# Patient Record
Sex: Female | Born: 1935 | Race: White | Hispanic: No | Marital: Single | Smoking: Never smoker
Health system: Southern US, Community
[De-identification: ages and names within clinical notes are randomized; demographics above are authoritative.]

## PROBLEM LIST (undated history)

## (undated) DIAGNOSIS — M1711 Unilateral primary osteoarthritis, right knee: Secondary | ICD-10-CM

## (undated) DIAGNOSIS — F0390 Unspecified dementia without behavioral disturbance: Secondary | ICD-10-CM

## (undated) DIAGNOSIS — I2510 Atherosclerotic heart disease of native coronary artery without angina pectoris: Secondary | ICD-10-CM

## (undated) DIAGNOSIS — I10 Essential (primary) hypertension: Secondary | ICD-10-CM

## (undated) DIAGNOSIS — I219 Acute myocardial infarction, unspecified: Secondary | ICD-10-CM

## (undated) DIAGNOSIS — E785 Hyperlipidemia, unspecified: Secondary | ICD-10-CM

## (undated) DIAGNOSIS — M1990 Unspecified osteoarthritis, unspecified site: Secondary | ICD-10-CM

## (undated) HISTORY — DX: Essential (primary) hypertension: I10

## (undated) HISTORY — DX: Unilateral primary osteoarthritis, right knee: M17.11

## (undated) HISTORY — PX: PARTIAL HYSTERECTOMY: SHX80

## (undated) HISTORY — PX: CATARACT EXTRACTION W/ INTRAOCULAR LENS  IMPLANT, BILATERAL: SHX1307

## (undated) HISTORY — PX: CARDIAC CATHETERIZATION: SHX172

## (undated) HISTORY — DX: Acute myocardial infarction, unspecified: I21.9

## (undated) HISTORY — DX: Atherosclerotic heart disease of native coronary artery without angina pectoris: I25.10

## (undated) HISTORY — PX: RETINAL DETACHMENT SURGERY: SHX105

## (undated) HISTORY — DX: Hyperlipidemia, unspecified: E78.5

---

## 1999-06-27 ENCOUNTER — Ambulatory Visit (HOSPITAL_COMMUNITY): Admission: RE | Admit: 1999-06-27 | Discharge: 1999-06-27 | Payer: Self-pay | Admitting: Family Medicine

## 1999-06-27 ENCOUNTER — Encounter: Payer: Self-pay | Admitting: Family Medicine

## 1999-10-27 ENCOUNTER — Ambulatory Visit (HOSPITAL_COMMUNITY): Admission: RE | Admit: 1999-10-27 | Discharge: 1999-10-27 | Payer: Self-pay | Admitting: Family Medicine

## 1999-10-27 ENCOUNTER — Encounter: Payer: Self-pay | Admitting: Family Medicine

## 2010-09-07 ENCOUNTER — Inpatient Hospital Stay (HOSPITAL_COMMUNITY): Admission: EM | Admit: 2010-09-07 | Discharge: 2010-09-09 | Payer: Self-pay | Admitting: Emergency Medicine

## 2010-09-07 ENCOUNTER — Ambulatory Visit: Payer: Self-pay | Admitting: Internal Medicine

## 2010-09-08 ENCOUNTER — Encounter: Payer: Self-pay | Admitting: Cardiovascular Disease

## 2010-09-20 ENCOUNTER — Ambulatory Visit: Payer: Self-pay | Admitting: Cardiovascular Disease

## 2010-09-20 ENCOUNTER — Encounter: Payer: Self-pay | Admitting: Internal Medicine

## 2010-09-20 DIAGNOSIS — I2510 Atherosclerotic heart disease of native coronary artery without angina pectoris: Secondary | ICD-10-CM

## 2010-09-20 DIAGNOSIS — I10 Essential (primary) hypertension: Secondary | ICD-10-CM

## 2010-10-24 ENCOUNTER — Encounter: Payer: Self-pay | Admitting: Internal Medicine

## 2010-12-22 ENCOUNTER — Ambulatory Visit: Admission: RE | Admit: 2010-12-22 | Discharge: 2010-12-22 | Payer: Self-pay | Source: Home / Self Care

## 2010-12-22 ENCOUNTER — Other Ambulatory Visit: Payer: Self-pay

## 2010-12-22 LAB — HEPATIC FUNCTION PANEL
ALT: 10 U/L (ref 0–35)
AST: 17 U/L (ref 0–37)
Albumin: 3.7 g/dL (ref 3.5–5.2)
Alkaline Phosphatase: 85 U/L (ref 39–117)
Bilirubin, Direct: 0.1 mg/dL (ref 0.0–0.3)
Total Bilirubin: 0.6 mg/dL (ref 0.3–1.2)
Total Protein: 6.4 g/dL (ref 6.0–8.3)

## 2010-12-22 LAB — LIPID PANEL
Cholesterol: 111 mg/dL (ref 0–200)
HDL: 59.1 mg/dL (ref >39.00)
LDL Cholesterol: 43 mg/dL (ref 0–99)
Total CHOL/HDL Ratio: 2
Triglycerides: 44 mg/dL (ref 0.0–149.0)
VLDL: 8.8 mg/dL (ref 0.0–40.0)

## 2011-01-10 ENCOUNTER — Telehealth: Payer: Self-pay | Admitting: Cardiovascular Disease

## 2011-01-12 NOTE — Assessment & Plan Note (Signed)
Summary: Rebecca Ford   Visit Type:  EPH Primary Provider:  Elias Else  CC:  no cardiac complaints today.  History of Present Illness: 77 yo WF with history of HTN, hyperlipidemia and recent diagnosis of CAD after presentation with a NSTEMI who underwent cardiac cath on 09/07/10 and was found to have three vessel CAD. Drug eluting stents were placed in the RCA, LAD and Diagonal branch. She did well and was discharged home on September 10, 2010. She is here today for hospital follow up. She has had no chest pain, SOB, palpitations, near syncope, syncope, LE edema, PND or orthopnea.   Current Medications (verified): 1)  Aspirin Ec 325 Mg Tbec (Aspirin) .... Take One Tablet By Mouth Daily 2)  Plavix 75 Mg Tabs (Clopidogrel Bisulfate) .Marland Kitchen.. 1 Tab Once Daily 3)  Norvasc 5 Mg Tabs (Amlodipine Besylate) .Marland Kitchen.. 1 Tab Once Daily 4)  Nitrostat 0.4 Mg Subl (Nitroglycerin) .Marland Kitchen.. 1 Tablet Under Tongue At Onset of Chest Pain; You May Repeat Every 5 Minutes For Up To 3 Doses. 5)  Crestor 40 Mg Tabs (Rosuvastatin Calcium) .Marland Kitchen.. 1 Tab At Bedtime 6)  Amitriptyline Hcl 50 Mg Tabs (Amitriptyline Hcl) .Marland Kitchen.. 1-2 Tab At Bedtime As Needed 7)  Benicar Hct 40-25 Mg Tabs (Olmesartan Medoxomil-Hctz) .Marland Kitchen.. 1 Tab Qam 8)  Fish Oil 1000 Mg Caps (Omega-3 Fatty Acids) .Marland Kitchen.. 1 Cap Two Times A Day 9)  Lisinopril 40 Mg Tabs (Lisinopril) .Marland Kitchen.. 1 Tab Qam 10)  Metoprolol Succinate 100 Mg Xr24h-Tab (Metoprolol Succinate) .Marland Kitchen.. 1 Tab Qam 11)  Icaps  Caps (Multiple Vitamins-Minerals) .Marland Kitchen.. 1 Cap Once Daily  Allergies: 1)  ! Codeine  Past History:  Past Medical History: Non-ST elevation myocardial infarction 9/11 CAD s/p cath 09/07/10 with 3 vessel CAD, DES in RCA, Diagonal and LAD Hypertension Hyperlipidemia  Past Surgical History: Partial Hysterectomy  Family History: Reviewed history from 09/16/2010 and no changes required. Mother had myocardial infarction at age 71, one sibling with diagnosis of coronary artery disease in his 45s.     The patient has four other siblings with diagnosis of coronary artery disease but not premature diagnosis.   Social History: Reviewed history from 09/16/2010 and no changes required. The patient lives in [place] alone.   She is retired from the [employer] after 40 years. She has no tobacco, EtOH, illicit drug use.  No herbal meds.  Follows a low calorie diet.  No regular exercise but is very active.   Review of Systems       The patient complains of fatigue.  The patient denies malaise, fever, weight gain/loss, vision loss, decreased hearing, hoarseness, chest pain, palpitations, shortness of breath, prolonged cough, wheezing, sleep apnea, coughing up blood, abdominal pain, blood in stool, nausea, vomiting, diarrhea, heartburn, incontinence, blood in urine, muscle weakness, joint pain, leg swelling, rash, skin lesions, headache, fainting, dizziness, depression, anxiety, enlarged lymph nodes, easy bruising or bleeding, and environmental allergies.    Vital Signs:  Patient profile:   77 year old female Height:      65 inches Weight:      149 pounds BMI:     24.88 Pulse rate:   76 / minute Pulse rhythm:   irregular BP sitting:   108 / 70  (left arm) Cuff size:   large  Vitals Entered By: Danielle Rankin, CMA (September 20, 2010 1:52 PM)  Physical Exam  General:  General: Well developed, well nourished, NAD HEENT: OP clear, mucus membranes moist SKIN: warm, dry Neuro: No focal deficits Musculoskeletal:  Muscle strength 5/5 all ext Psychiatric: Mood and affect normal Neck: No JVD, no carotid bruits, no thyromegaly, no lymphadenopathy. Lungs:Clear bilaterally, no wheezes, rhonci, crackles CV: RRR no murmurs, gallops rubs Abdomen: soft, NT, ND, BS present Extremities: No edema, pulses 2+.    Cardiac Cath  Procedure date:  09/07/2010  Findings:       1. The left main coronary artery had no disease.   2. The left anterior descending artery coursed to the apex and        appeared to have a 20% ostial stenosis.  There was a discrete       tubular 80% stenosis in the mid vessel.  There was mild plaque       disease in the distal vessel.  The first diagonal branch was       moderate sized and had a 99% hazy stenosis.   3. The circumflex artery had an ostial 30% stenosis and gave off a       small-caliber first obtuse marginal branch that had plaque disease.       The second obtuse marginal branch was moderate sized with a 40%       stenosis.   4. The right coronary artery was a large dominant vessel with a mid       99% stenosis.  There was plaque in the distal vessel.   5. Left ventricular angiogram was performed in the RAO projection       which showed normal left ventricular systolic function with an       ejection fraction of 50%.      IMPRESSION:   1. Triple-vessel coronary artery disease.   2. Successful three-vessel percutaneous coronary intervention.   3. Non-ST-elevation myocardial infarction.   4. Normal left ventricular systolic function.      Echocardiogram  Procedure date:  09/08/2010  Findings:       Left ventricle: The cavity size was normal. Wall thickness was     increased in a pattern of mild LVH. Systolic function was normal.     The estimated ejection fraction was in the range of 55% to 60%.     Regional wall motion abnormalities cannot be excluded. Doppler     parameters are consistent with abnormal left ventricular     relaxation (grade 1 diastolic dysfunction).   - Mitral valve: Calcified annulus.  EKG  Procedure date:  09/20/2010  Findings:      NSR, rate 76 bpm. LAD. Incomplete RBBB. Nonspecific T wave changes.   Impression & Recommendations:  Problem # 1:  CAD, NATIVE VESSEL (ICD-414.01)  Stable s/p DES to LAD, Diagonal, RCA. She is doing well. Will continue ASA and Plavix for at least one year. Will decrease ASA to 81 mg per day in three months. Continue beta blocker and statin. Will check fasting lipids and LFTS  in 12 weeks.   Her updated medication list for this problem includes:    Aspirin Ec 325 Mg Tbec (Aspirin) .Marland Kitchen... Take one tablet by mouth daily    Plavix 75 Mg Tabs (Clopidogrel bisulfate) .Marland Kitchen... 1 tab once daily    Norvasc 5 Mg Tabs (Amlodipine besylate) .Marland Kitchen... 1 tab once daily    Nitrostat 0.4 Mg Subl (Nitroglycerin) .Marland Kitchen... 1 tablet under tongue at onset of chest pain; you may repeat every 5 minutes for up to 3 doses.    Lisinopril 40 Mg Tabs (Lisinopril) .Marland Kitchen... 1 tab qam    Metoprolol Succinate 100 Mg Xr24h-tab (Metoprolol succinate) .Marland KitchenMarland KitchenMarland KitchenMarland Kitchen  1 tab qam  Orders: EKG w/ Interpretation (93000)  Problem # 2:  HYPERTENSION, BENIGN (ICD-401.1) Continue current meds. Will have her take Norvasc at night.  She will follow her BP readings at home on her home cuff and will call us if her SBP is running higher than 140.   Her updated medication list for this problem includes:    Aspirin Ec 325 Mg Tbec (Aspirin) .Marland Kitchen... Take one tablet by mouth daily    Norvasc 5 Mg Tabs (Amlodipine besylate) .Marland Kitchen... 1 tab once daily    Benicar Hct 40-25 Mg Tabs (Olmesartan medoxomil-hctz) .Marland Kitchen... 1 tab qam    Lisinopril 40 Mg Tabs (Lisinopril) .Marland Kitchen... 1 tab qam    Metoprolol Succinate 100 Mg Xr24h-tab (Metoprolol succinate) .Marland Kitchen... 1 tab qam  Patient Instructions: 1)  Your physician recommends that you schedule a follow-up appointment in: 6 months 2)  Your physician recommends that you return for a FASTING lipid profile and liver function test in 12 weeks. 3)  Your physician recommends that you continue on your current medications as directed. Please refer to the Current Medication list given to you today. DECREASE your ASPIRIN to 81mg  by mouth in 3 months.

## 2011-01-12 NOTE — Miscellaneous (Signed)
Summary: [facility] Physician Order/Treatment Plan   [facility] Physician Order/Treatment Plan   Imported By: Roderic Ovens 10/10/2010 10:47:56  _____________________________________________________________________  External Attachment:    Type:   Image     Comment:   External Document

## 2011-01-12 NOTE — Cardiovascular Report (Signed)
Summary: [facility]  [facility]   Imported By: Roderic Ovens 09/26/2010 17:05:42  _____________________________________________________________________  External Attachment:    Type:   Image     Comment:   External Document

## 2011-01-12 NOTE — Miscellaneous (Signed)
Summary: [facility] Progress Report   [facility]   Imported By: Roderic Ovens 11/29/2010 11:57:04  _____________________________________________________________________  External Attachment:    Type:   Image     Comment:   External Document

## 2011-01-12 NOTE — Miscellaneous (Signed)
Summary: Orders Update  Clinical Lists Changes  Orders: Added new Test order of TLB-Hepatic/Liver Function Pnl (80076-HEPATIC) - Signed Added new Test order of TLB-Lipid Panel (80061-LIPID) - Signed 

## 2011-01-18 NOTE — Progress Notes (Signed)
Summary: test results and question about Plavix  Phone Note Call from Patient Call back at Home Phone [phone]   Caller: Patient Complaint: Urinary/GYN Problems Summary of Call: test results and question about Plavix Initial call taken by: Judie Grieve,  January 10, 2011 2:10 PM  Follow-up for Phone Call        LVMTCB* Whitney Maeola Sarah RN  January 10, 2011 3:12 PM  Follow-up by: Whitney Maeola Sarah RN,  January 10, 2011 3:12 PM  Additional Follow-up for Phone Call Additional follow up Details #1::        pt rtn call from yesterday plz call her at [phone].Rebecca Ford is on plavix and crestor is very expensive and she can't afford it, is there something else she can take that dosen't cost so much   Omer Jack  January 12, 2011 1:34 PM     Additional Follow-up for Phone Call Additional follow up Details #2::    I spoke with the pt and made her aware of lab results.  I made the pt aware that there is not a cheaper alternative to plavix at this time.  The pt had recent stent placement in September 2011 and would have to remain on Plavix a minimum of 1 year and then Dr Clifton James would decide if pt can stop med.  I spoke with the pt about Lipitor as a possible alternative to Crestor. The pt will check with pharmacy to see how much this medication would cost per month.  If this medication is cheaper then the pt will call the office back to discuss changing statin.  Follow-up by: Julieta Gutting, RN, BSN,  January 12, 2011 2:06 PM

## 2011-02-23 LAB — COMPREHENSIVE METABOLIC PANEL
ALT: 14 U/L (ref 0–35)
AST: 30 U/L (ref 0–37)
CO2: 26 mEq/L (ref 19–32)
Chloride: 97 mEq/L (ref 96–112)
Creatinine, Ser: 0.64 mg/dL (ref 0.4–1.2)
GFR calc Af Amer: >60 mL/min (ref >60)
GFR calc non Af Amer: >60 mL/min (ref >60)
Sodium: 129 mEq/L — ABNORMAL LOW (ref 135–145)
Total Bilirubin: 0.5 mg/dL (ref 0.3–1.2)

## 2011-02-23 LAB — CBC
Hemoglobin: 13.2 g/dL (ref 12.0–15.0)
MCHC: 35.4 g/dL (ref 30.0–36.0)
Platelets: 189 10*3/uL (ref 150–400)
RBC: 3.98 MIL/uL (ref 3.87–5.11)
RBC: 4.15 MIL/uL (ref 3.87–5.11)
WBC: 7 10*3/uL (ref 4.0–10.5)
WBC: 8.6 10*3/uL (ref 4.0–10.5)

## 2011-02-23 LAB — CARDIAC PANEL(CRET KIN+CKTOT+MB+TROPI)
CK, MB: 12.5 ng/mL (ref 0.3–4.0)
Total CK: 124 U/L (ref 7–177)

## 2011-02-23 LAB — DIFFERENTIAL
Basophils Relative: 0 % (ref 0–1)
Lymphocytes Relative: 12 % (ref 12–46)
Monocytes Relative: 4 % (ref 3–12)
Neutro Abs: 7.2 10*3/uL (ref 1.7–7.7)
Neutrophils Relative %: 84 % — ABNORMAL HIGH (ref 43–77)

## 2011-02-23 LAB — BASIC METABOLIC PANEL
CO2: 26 mEq/L (ref 19–32)
CO2: 28 mEq/L (ref 19–32)
Calcium: 8.6 mg/dL (ref 8.4–10.5)
Calcium: 8.9 mg/dL (ref 8.4–10.5)
Chloride: 102 mEq/L (ref 96–112)
Creatinine, Ser: 0.69 mg/dL (ref 0.4–1.2)
GFR calc Af Amer: >60 mL/min (ref >60)
GFR calc Af Amer: >60 mL/min (ref >60)
GFR calc Af Amer: >60 mL/min (ref >60)
GFR calc non Af Amer: >60 mL/min (ref >60)
Sodium: 131 mEq/L — ABNORMAL LOW (ref 135–145)
Sodium: 136 mEq/L (ref 135–145)

## 2011-02-23 LAB — CK TOTAL AND CKMB (NOT AT ARMC): Total CK: 120 U/L (ref 7–177)

## 2011-02-23 LAB — PLATELET INHIBITION P2Y12
P2Y12 % Inhibition: 50 %
Platelet Function Baseline: 351 [PRU] (ref 194–418)

## 2011-02-23 LAB — POCT CARDIAC MARKERS
CKMB, poc: 2.1 ng/mL (ref 1.0–8.0)
Myoglobin, poc: 88 ng/mL (ref 12–200)

## 2011-04-04 ENCOUNTER — Encounter: Payer: Self-pay | Admitting: Cardiovascular Disease

## 2011-04-05 ENCOUNTER — Ambulatory Visit (INDEPENDENT_AMBULATORY_CARE_PROVIDER_SITE_OTHER): Payer: Medicare Other | Admitting: Cardiovascular Disease

## 2011-04-05 ENCOUNTER — Encounter: Payer: Self-pay | Admitting: Cardiovascular Disease

## 2011-04-05 VITALS — BP 126/68 | HR 67 | Resp 18 | Ht 65.0 in | Wt 157.0 lb

## 2011-04-05 DIAGNOSIS — I2510 Atherosclerotic heart disease of native coronary artery without angina pectoris: Secondary | ICD-10-CM

## 2011-04-05 DIAGNOSIS — I10 Essential (primary) hypertension: Secondary | ICD-10-CM

## 2011-04-05 NOTE — Progress Notes (Signed)
History of Present Illness:77 yo WF with history of HTN, hyperlipidemia and CAD here today for cardiac follow up. She had a NSTEMI and underwent cardiac cath on 09/07/10 and was found to have three vessel CAD. Drug eluting stents were placed in the RCA, LAD and Diagonal branch.  She has had no chest pain, SOB, palpitations, near syncope, syncope, LE edema, PND or orthopnea.   Her primary care doctor is Elias Else, [facility].  Most recent lipid profile from January 2012 with total chol: 111, hdl: 59, LDL 43. TG 44.   Past Medical History  Diagnosis Date  . MI (myocardial infarction)     Non-ST elevation 9/11  . CAD (coronary artery disease)     s/p cath 09/07/10 w/3 vessel CAD, DES in RCA, Diagonal & LAD  . HTN (hypertension)   . Hyperlipemia     Past Surgical History  Procedure Date  . Partial hysterectomy     Current Outpatient Prescriptions  Medication Sig Dispense Refill  . amitriptyline (ELAVIL) 50 MG tablet Take by mouth. 1-2 tabs qhs prn       . amLODipine (NORVASC) 5 MG tablet Take 5 mg by mouth daily.        Marland Kitchen aspirin EC 325 MG EC tablet Take 325 mg by mouth daily.        . clopidogrel (PLAVIX) 75 MG tablet Take 75 mg by mouth daily.        . fish oil-omega-3 fatty acids 1000 MG capsule Take 1 g by mouth 2 (two) times daily.        Marland Kitchen lisinopril (PRINIVIL,ZESTRIL) 40 MG tablet Take 40 mg by mouth daily.        . metoprolol (TOPROL-XL) 100 MG 24 hr tablet Take 100 mg by mouth daily.        . Multiple Vitamins-Minerals (ICAPS MV PO) Take 1 capsule by mouth daily.        . nitroGLYCERIN (NITROSTAT) 0.4 MG SL tablet Place 0.4 mg under the tongue. 1 tab under tongue at onset of chest pain; you may repeat every for up to 3 doses       . olmesartan-hydrochlorothiazide (BENICAR HCT) 40-25 MG per tablet Take 1 tablet by mouth daily.        . rosuvastatin (CRESTOR) 40 MG tablet Take 40 mg by mouth at bedtime.          Allergies  Allergen Reactions  . Codeine      History   Social History  . Marital Status: Single    Spouse Name: N/A    Number of Children: N/A  . Years of Education: N/A   Occupational History  . Not on file.   Social History Main Topics  . Smoking status: Never Smoker   . Smokeless tobacco: Not on file  . Alcohol Use: No  . Drug Use: Not on file  . Sexually Active: Not on file   Other Topics Concern  . Not on file   Social History Narrative  . No narrative on file    Family History  Problem Relation Age of Onset  . Heart attack Mother   . Coronary artery disease Brother   . Coronary artery disease Other     diagnosis of CAD but not premature diagnosis    Review of Systems:  As stated in the HPI and otherwise negative.   BP 126/68  Pulse 67  Resp 18  Ht 5\' 5"  (1.651 m)  Wt 157 lb (  71.215 kg)  BMI 26.13 kg/m2  Physical Examination: General: Well developed, well nourished, NAD HEENT: OP clear, mucus membranes moist SKIN: warm, dry. No rashes. Neuro: No focal deficits Musculoskeletal: Muscle strength 5/5 all ext Psychiatric: Mood and affect normal Neck: No JVD, no carotid bruits, no thyromegaly, no lymphadenopathy. Lungs:Clear bilaterally, no wheezes, rhonci, crackles Cardiovascular: Regular rate and rhythm. No murmurs, gallops or rubs. Abdomen:Soft. Bowel sounds present. Non-tender.  Extremities: No lower extremity edema. Pulses are 2 + in the bilateral DP/PT.  EKG:NSR, rate 67 bpm. Incomplete RBBB, Non-specific T wave abnormality.

## 2011-04-05 NOTE — Assessment & Plan Note (Signed)
BP well controlled. No changes.  

## 2011-04-05 NOTE — Assessment & Plan Note (Signed)
Stable. Continue ASA and Plavix for at least one year. Continue beta blocker, Ace-inhibitor, statin.

## 2011-04-20 ENCOUNTER — Other Ambulatory Visit: Payer: Self-pay | Admitting: Cardiovascular Disease

## 2011-05-01 ENCOUNTER — Other Ambulatory Visit: Payer: Self-pay | Admitting: Cardiovascular Disease

## 2011-05-04 NOTE — Telephone Encounter (Signed)
Pt waiting on refill for amlodipine 5 mg [facility] [address] sine 5-21

## 2011-05-09 ENCOUNTER — Telehealth: Payer: Self-pay | Admitting: Cardiovascular Disease

## 2011-05-09 NOTE — Telephone Encounter (Signed)
Pt needs amlodipine to be call in to [facility] [address]. Pt has called several times for her meds and no one has refill her meds or call her re her meds.    Pt would like a call once meds are call in to the pharmacy.

## 2011-05-10 ENCOUNTER — Other Ambulatory Visit: Payer: Self-pay | Admitting: *Deleted

## 2011-05-10 NOTE — Telephone Encounter (Signed)
This medication order was already filled and picked up.  No further action needed.  Judithe Modest, CMA

## 2011-06-12 ENCOUNTER — Telehealth: Payer: Self-pay | Admitting: Cardiovascular Disease

## 2011-06-12 DIAGNOSIS — E785 Hyperlipidemia, unspecified: Secondary | ICD-10-CM

## 2011-06-12 DIAGNOSIS — I25810 Atherosclerosis of coronary artery bypass graft(s) without angina pectoris: Secondary | ICD-10-CM

## 2011-06-12 MED ORDER — CLOPIDOGREL BISULFATE 75 MG PO TABS: 75.0000 mg | ORAL_TABLET | Freq: Every day | ORAL | Status: DC

## 2011-06-12 NOTE — Telephone Encounter (Signed)
SPOKE WITH PT SENT NEW SCRIPT VIA [facility] FOR GEN PLAVIX  IS WILLING TO TRY AN INEXPENSIVE CHOLESTEROL MED IS CURRENTLY TAKING CRESTOR  IS IN DOUGHNUT WHOLE AND THESE TWO MEDS ARE THE MOST EXPENSIVE PLEASE ADVISE./CY

## 2011-06-12 NOTE — Telephone Encounter (Signed)
Pt has question re meds. Pt would like to talk to a nurse. °

## 2011-06-12 NOTE — Telephone Encounter (Signed)
Pt calling back thought she was waiting on call from nurse, i told her the nurse will call back, she said she's in "doughnut hole" with the insurance and wants to know if she can go on generic

## 2011-06-15 NOTE — Telephone Encounter (Signed)
LMTCB

## 2011-06-15 NOTE — Telephone Encounter (Signed)
We can switch her to atorvastatin 80 mg po QHS. Thanks, chris

## 2011-06-29 MED ORDER — ATORVASTATIN CALCIUM 80 MG PO TABS: 80.0000 mg | ORAL_TABLET | Freq: Every day | ORAL | Status: DC

## 2011-06-29 NOTE — Telephone Encounter (Signed)
Addended by: Ellender Hose on: 06/29/2011 04:11 PM   Modules accepted: Orders

## 2011-06-29 NOTE — Telephone Encounter (Signed)
Will send in a new prescription for Atorvastatin 80 mg daily.

## 2011-10-17 ENCOUNTER — Telehealth: Payer: Self-pay | Admitting: Cardiovascular Disease

## 2011-10-17 NOTE — Telephone Encounter (Signed)
I talked with pt. Pt asking if she can stop atorvastatin. I told pt she should keep taking atorvastatin to continue to get the cholesterol lowering benefit unless she was having a significant side effect from the medication. Pt states she will continue atorvastatin.

## 2011-10-17 NOTE — Telephone Encounter (Signed)
Pt has questions regarding atorzastatin 80mg 

## 2012-01-06 ENCOUNTER — Other Ambulatory Visit: Payer: Self-pay | Admitting: Cardiovascular Disease

## 2012-01-23 ENCOUNTER — Other Ambulatory Visit: Payer: Self-pay | Admitting: Cardiovascular Disease

## 2012-02-13 ENCOUNTER — Telehealth: Payer: Self-pay | Admitting: Cardiovascular Disease

## 2012-02-13 NOTE — Telephone Encounter (Signed)
Patient would like return call [phone]  Patient has questions about meds, please return call at hm#

## 2012-02-13 NOTE — Telephone Encounter (Signed)
Spoke with pt who is asking if any of her medicines could cause her to have weak spells.  She states she has been feeling weak and tired for awhile at times.  Overall feels good but has episodes where she feels weak.  Has not passed out or felt faint. No chest pain.  She has upcoming appt with primary care MD and will discuss with him. Appt made for pt to see Dr. Clifton James for 12 month follow up on April 04, 2012 at 10:00

## 2012-03-21 ENCOUNTER — Other Ambulatory Visit: Payer: Self-pay | Admitting: Cardiovascular Disease

## 2012-03-22 ENCOUNTER — Telehealth: Payer: Self-pay | Admitting: *Deleted

## 2012-03-22 NOTE — Telephone Encounter (Signed)
Pharmacy calling stating they have 2 orders for med for hyperlipidemia--crestor 40mg  from dr reid(pcp) and 1 from dr Clifton James for atorvastatin--pharmacy wants to know which one?--advised to go with crestor 40mg  1 poqd, as that is what is listed in med list--please call pt and change if i ordered the wrong med --thanks nt

## 2012-03-22 NOTE — Telephone Encounter (Signed)
Refilled atorvastatin

## 2012-04-04 ENCOUNTER — Ambulatory Visit (INDEPENDENT_AMBULATORY_CARE_PROVIDER_SITE_OTHER): Payer: Medicare Other | Admitting: Cardiovascular Disease

## 2012-04-04 ENCOUNTER — Encounter: Payer: Self-pay | Admitting: Cardiovascular Disease

## 2012-04-04 VITALS — BP 131/64 | HR 68 | Ht 65.0 in | Wt 154.0 lb

## 2012-04-04 DIAGNOSIS — I2510 Atherosclerotic heart disease of native coronary artery without angina pectoris: Secondary | ICD-10-CM

## 2012-04-04 MED ORDER — ASPIRIN EC 81 MG PO TBEC: 81.0000 mg | DELAYED_RELEASE_TABLET | Freq: Every day | ORAL | Status: AC

## 2012-04-04 NOTE — Assessment & Plan Note (Addendum)
Stable. Will lower ASA to 81 mg Qdaily. Will stop Plavix. Continue beta blocker and statin. BP well controlled. Lipids well controlled in primary care.

## 2012-04-04 NOTE — Patient Instructions (Signed)
Your physician wants you to follow-up in: 12 months.You will receive a reminder letter in the mail two months in advance. If you don't receive a letter, please call our office to schedule the follow-up appointment.  Your physician has recommended you make the following change in your medication: Stop Plavix.  Decrease aspirin to 81 mg by mouth daily

## 2012-04-04 NOTE — Progress Notes (Signed)
History of Present Illness: 78 yo WF with history of HTN, hyperlipidemia and CAD here today for cardiac follow up. She had a NSTEMI and underwent cardiac cath on 09/07/10 and was found to have three vessel CAD. Drug eluting stents were placed in the RCA, LAD and Diagonal branch.   She has had no chest pain, SOB, palpitations, near syncope, syncope, LE edema, PND or orthopnea. She has been planting a garden.   Primary Care Physician: Elias Else, [facility].   Last Lipid Profile:  More recent in primary care.  Lipid Panel     Component Value Date/Time   CHOL 111 12/22/2010 1142   TRIG 44.0 12/22/2010 1142   HDL 59.10 12/22/2010 1142   CHOLHDL 2 12/22/2010 1142   VLDL 8.8 12/22/2010 1142   LDLCALC 43 12/22/2010 1142     Past Medical History  Diagnosis Date  . MI (myocardial infarction)     Non-ST elevation 9/11  . CAD (coronary artery disease)     s/p cath 09/07/10 w/3 vessel CAD, DES in RCA, Diagonal & LAD  . HTN (hypertension)   . Hyperlipemia     Past Surgical History  Procedure Date  . Partial hysterectomy     Current Outpatient Prescriptions  Medication Sig Dispense Refill  . amitriptyline (ELAVIL) 50 MG tablet Take by mouth. 1-2 tabs qhs prn       . amLODipine (NORVASC) 5 MG tablet TAKE 1 TABLET BY MOUTH EVERY DAY  30 tablet  6  . aspirin EC 325 MG EC tablet Take 325 mg by mouth daily.        Marland Kitchen atorvastatin (LIPITOR) 80 MG tablet TAKE 1 TABLET BY MOUTH EVERY DAY  30 tablet  6  . clopidogrel (PLAVIX) 75 MG tablet TAKE 1 TABLET (75 MG TOTAL) BY MOUTH DAILY.  30 tablet  6  . fish oil-omega-3 fatty acids 1000 MG capsule Take 1 g by mouth 2 (two) times daily.        . metoprolol (TOPROL-XL) 100 MG 24 hr tablet Take 100 mg by mouth daily.        . Multiple Vitamins-Minerals (ICAPS MV PO) Take 1 capsule by mouth daily.        . nitroGLYCERIN (NITROSTAT) 0.4 MG SL tablet Place 0.4 mg under the tongue. 1 tab under tongue at onset of chest pain; you may repeat every  for up to 3 doses       . olmesartan-hydrochlorothiazide (BENICAR HCT) 40-25 MG per tablet Take 1 tablet by mouth daily.          Allergies  Allergen Reactions  . Codeine     History   Social History  . Marital Status: Single    Spouse Name: N/A    Number of Children: N/A  . Years of Education: N/A   Occupational History  . Not on file.   Social History Main Topics  . Smoking status: Never Smoker   . Smokeless tobacco: Not on file  . Alcohol Use: No  . Drug Use: Not on file  . Sexually Active: Not on file   Other Topics Concern  . Not on file   Social History Narrative  . No narrative on file    Family History  Problem Relation Age of Onset  . Heart attack Mother   . Coronary artery disease Brother   . Coronary artery disease Other     diagnosis of CAD but not premature diagnosis    Review of  Systems:  As stated in the HPI and otherwise negative.   BP 131/64  Pulse 68  Ht 5\' 5"  (1.651 m)  Wt 154 lb (69.854 kg)  BMI 25.63 kg/m2  Physical Examination: General: Well developed, well nourished, NAD HEENT: OP clear, mucus membranes moist SKIN: warm, dry. No rashes. Neuro: No focal deficits Musculoskeletal: Muscle strength 5/5 all ext Psychiatric: Mood and affect normal Neck: No JVD, no carotid bruits, no thyromegaly, no lymphadenopathy. Lungs:Clear bilaterally, no wheezes, rhonci, crackles Cardiovascular: Regular rate and rhythm. No murmurs, gallops or rubs. Abdomen:Soft. Bowel sounds present. Non-tender.  Extremities: No lower extremity edema. Pulses are 2 + in the bilateral DP/PT.  EKG: NSR, rate 69 bpm. Incomplete RBBB. T wave abnormalities anterolateral leads. Unchanged from 2012.

## 2012-10-30 ENCOUNTER — Other Ambulatory Visit: Payer: Self-pay | Admitting: Cardiovascular Disease

## 2013-03-13 ENCOUNTER — Encounter: Payer: Medicare Other | Admitting: Cardiovascular Disease

## 2013-03-13 NOTE — Progress Notes (Signed)
No show for appt. cdm  

## 2013-05-28 ENCOUNTER — Other Ambulatory Visit: Payer: Self-pay

## 2013-05-28 ENCOUNTER — Other Ambulatory Visit: Payer: Self-pay | Admitting: Cardiovascular Disease

## 2013-06-03 ENCOUNTER — Ambulatory Visit (INDEPENDENT_AMBULATORY_CARE_PROVIDER_SITE_OTHER): Payer: Medicare Other | Admitting: Cardiovascular Disease

## 2013-06-03 ENCOUNTER — Encounter: Payer: Self-pay | Admitting: Cardiovascular Disease

## 2013-06-03 VITALS — BP 125/66 | HR 63 | Resp 12 | Ht 65.0 in | Wt 149.0 lb

## 2013-06-03 DIAGNOSIS — I2510 Atherosclerotic heart disease of native coronary artery without angina pectoris: Secondary | ICD-10-CM

## 2013-06-03 NOTE — Progress Notes (Signed)
History of Present Illness: 79 yo WF with history of HTN, hyperlipidemia and CAD here today for cardiac follow up. She had a NSTEMI and underwent cardiac cath on 09/07/10 and was found to have three vessel CAD. Drug eluting stents were placed in the RCA, LAD and Diagonal branch.   She is here today for follow up. She has had no chest pain, SOB, palpitations, near syncope, syncope, LE edema, PND or orthopnea. She has been planting a garden.   Primary Care Physician: Elias Else, [facility].   Last Lipid Profile: Followed in primary care.   Past Medical History  Diagnosis Date  . MI (myocardial infarction)     Non-ST elevation 9/11  . CAD (coronary artery disease)     s/p cath 09/07/10 w/3 vessel CAD, DES in RCA, Diagonal & LAD  . HTN (hypertension)   . Hyperlipemia     Past Surgical History  Procedure Laterality Date  . Partial hysterectomy      Current Outpatient Prescriptions  Medication Sig Dispense Refill  . amitriptyline (ELAVIL) 50 MG tablet Take by mouth. 1-2 tabs qhs prn       . amLODipine (NORVASC) 5 MG tablet TAKE 1 TABLET BY MOUTH EVERY DAY  30 tablet  6  . aspirin 81 MG tablet Take 81 mg by mouth daily.      Marland Kitchen atorvastatin (LIPITOR) 80 MG tablet TAKE 1 TABLET BY MOUTH EVERY DAY  30 tablet  6  . fish oil-omega-3 fatty acids 1000 MG capsule Take 1 g by mouth 2 (two) times daily.        . metoprolol (TOPROL-XL) 100 MG 24 hr tablet Take 100 mg by mouth daily.        . Multiple Vitamins-Minerals (ICAPS MV PO) Take 1 capsule by mouth daily.        . nitroGLYCERIN (NITROSTAT) 0.4 MG SL tablet Place 0.4 mg under the tongue. 1 tab under tongue at onset of chest pain; you may repeat every for up to 3 doses       . olmesartan-hydrochlorothiazide (BENICAR HCT) 40-25 MG per tablet Take 1 tablet by mouth daily.         No current facility-administered medications for this visit.    Allergies  Allergen Reactions  . Codeine     History   Social History  .  Marital Status: Single    Spouse Name: N/A    Number of Children: N/A  . Years of Education: N/A   Occupational History  . Not on file.   Social History Main Topics  . Smoking status: Never Smoker   . Smokeless tobacco: Not on file  . Alcohol Use: No  . Drug Use: Not on file  . Sexually Active: Not on file   Other Topics Concern  . Not on file   Social History Narrative  . No narrative on file    Family History  Problem Relation Age of Onset  . Heart attack Mother   . Coronary artery disease Brother   . Coronary artery disease Other     diagnosis of CAD but not premature diagnosis    Review of Systems:  As stated in the HPI and otherwise negative.   BP 125/66  Pulse 63  Ht 5\' 5"  (1.651 m)  Wt 149 lb (67.586 kg)  BMI 24.79 kg/m2  Physical Examination: General: Well developed, well nourished, NAD HEENT: OP clear, mucus membranes moist SKIN: warm, dry. No rashes. Neuro: No focal deficits  Musculoskeletal: Muscle strength 5/5 all ext Psychiatric: Mood and affect normal Neck: No JVD, no carotid bruits, no thyromegaly, no lymphadenopathy. Lungs:Clear bilaterally, no wheezes, rhonci, crackles Cardiovascular: Regular rate and rhythm. No murmurs, gallops or rubs. Abdomen:Soft. Bowel sounds present. Non-tender.  Extremities: No lower extremity edema. Pulses are 2 + in the bilateral DP/PT.  EKG: NSR, rate 62 bpm. Incomplete RBBB. Non-specific T wave abnormality.   Assessment and Plan:   1. CAD: Stable. Continue current meds including ASA, beta blocker and statin. BP well controlled. Lipids followed in primary care.

## 2013-06-03 NOTE — Patient Instructions (Addendum)
Your physician wants you to follow-up in:  12 months.  You will receive a reminder letter in the mail two months in advance. If you don't receive a letter, please call our office to schedule the follow-up appointment.   

## 2014-02-17 ENCOUNTER — Other Ambulatory Visit: Payer: Self-pay | Admitting: Cardiovascular Disease

## 2014-02-19 ENCOUNTER — Telehealth: Payer: Self-pay | Admitting: Cardiovascular Disease

## 2014-02-19 NOTE — Telephone Encounter (Signed)
Returned call to patient no answer.LMTC. 

## 2014-02-19 NOTE — Telephone Encounter (Signed)
New Message:  [name] wanting to clarify her medications. Pt states she wants to make sure she is taking everything she has been prescribed.

## 2014-02-24 NOTE — Telephone Encounter (Signed)
Spoke with pt who is asking what medications we have listed that she is taking. I reviewed list with pt.

## 2014-06-04 ENCOUNTER — Ambulatory Visit (INDEPENDENT_AMBULATORY_CARE_PROVIDER_SITE_OTHER): Payer: Medicare Other | Admitting: Cardiovascular Disease

## 2014-06-04 ENCOUNTER — Encounter: Payer: Self-pay | Admitting: Cardiovascular Disease

## 2014-06-04 VITALS — BP 132/70 | HR 60 | Ht 65.0 in | Wt 142.0 lb

## 2014-06-04 DIAGNOSIS — I2510 Atherosclerotic heart disease of native coronary artery without angina pectoris: Secondary | ICD-10-CM

## 2014-06-04 DIAGNOSIS — E785 Hyperlipidemia, unspecified: Secondary | ICD-10-CM

## 2014-06-04 DIAGNOSIS — F17201 Nicotine dependence, unspecified, in remission: Secondary | ICD-10-CM

## 2014-06-04 DIAGNOSIS — I10 Essential (primary) hypertension: Secondary | ICD-10-CM

## 2014-06-04 DIAGNOSIS — Z87891 Personal history of nicotine dependence: Secondary | ICD-10-CM

## 2014-06-04 MED ORDER — NITROGLYCERIN 0.4 MG SL SUBL: 0.4000 mg | SUBLINGUAL_TABLET | SUBLINGUAL | Status: AC | PRN

## 2014-06-04 NOTE — Progress Notes (Signed)
History of Present Illness: 80 yo WF with history of HTN, hyperlipidemia and CAD here today for cardiac follow up. She had a NSTEMI and underwent cardiac cath on 09/07/10 and was found to have three vessel CAD. Drug eluting stents were placed in the RCA, LAD and Diagonal branch.   She is here today for follow up. She has had no chest pain, SOB, palpitations, near syncope, syncope, LE edema, PND or orthopnea.   Primary Care Physician: [name], [facility].   Last Lipid Profile: Followed in primary care.   Past Medical History  Diagnosis Date  . MI (myocardial infarction)     Non-ST elevation 9/11  . CAD (coronary artery disease)     s/p cath 09/07/10 w/3 vessel CAD, DES in RCA, Diagonal & LAD  . HTN (hypertension)   . Hyperlipemia     Past Surgical History  Procedure Laterality Date  . Partial hysterectomy      Current Outpatient Prescriptions  Medication Sig Dispense Refill  . amitriptyline (ELAVIL) 50 MG tablet Take by mouth. 1-2 tabs qhs prn       . amLODipine (NORVASC) 5 MG tablet TAKE 1 TABLET BY MOUTH EVERY DAY  30 tablet  6  . aspirin 81 MG tablet Take 81 mg by mouth daily.      Marland Kitchen atorvastatin (LIPITOR) 80 MG tablet TAKE 1 TABLET BY MOUTH EVERY DAY  30 tablet  3  . BIOTIN 5000 PO Take by mouth.      . Calcium Carbonate-Vitamin D (OYSTER SHELL CALCIUM 500 + D PO) Take by mouth.      . Cholecalciferol (VITAMIN D-3 PO) Take by mouth.      . fish oil-omega-3 fatty acids 1000 MG capsule Take 1 g by mouth 2 (two) times daily.        Marland Kitchen losartan-hydrochlorothiazide (HYZAAR) 100-25 MG per tablet       . metoprolol (LOPRESSOR) 50 MG tablet       . Multiple Vitamins-Minerals (ICAPS MV PO) Take 1 capsule by mouth daily.        . nitroGLYCERIN (NITROSTAT) 0.4 MG SL tablet Place 0.4 mg under the tongue. 1 tab under tongue at onset of chest pain; you may repeat every 6min for up to 3 doses        No current facility-administered medications for this visit.     Allergies  Allergen Reactions  . Codeine     History   Social History  . Marital Status: Single    Spouse Name: N/A    Number of Children: N/A  . Years of Education: N/A   Occupational History  . Not on file.   Social History Main Topics  . Smoking status: Never Smoker   . Smokeless tobacco: Not on file  . Alcohol Use: No  . Drug Use: Not on file  . Sexual Activity: Not on file   Other Topics Concern  . Not on file   Social History Narrative  . No narrative on file    Family History  Problem Relation Age of Onset  . Heart attack Mother   . Coronary artery disease Brother   . Coronary artery disease Other     diagnosis of CAD but not premature diagnosis    Review of Systems:  As stated in the HPI and otherwise negative.   BP 132/70  Pulse 60  Ht 5\' 5"  (1.651 m)  Wt 142 lb (64.411 kg)  BMI 23.63 kg/m2  Physical  Examination: General: Well developed, well nourished, NAD HEENT: OP clear, mucus membranes moist SKIN: warm, dry. No rashes. Neuro: No focal deficits Musculoskeletal: Muscle strength 5/5 all ext Psychiatric: Mood and affect normal Neck: No JVD, no carotid bruits, no thyromegaly, no lymphadenopathy. Lungs:Clear bilaterally, no wheezes, rhonci, crackles Cardiovascular: Regular rate and rhythm. No murmurs, gallops or rubs. Abdomen:Soft. Bowel sounds present. Non-tender.  Extremities: No lower extremity edema. Pulses are 2 + in the bilateral DP/PT.  EKG: NSR, rate 60 bpm. Incomplete RBBB. ST and T wave abnormalities.   Assessment and Plan:   1. CAD: Stable. Continue current meds including ASA, beta blocker and statin.  2. HTN: BP controlled. No changes.   3. HLD: Continue statin. Lipids followed in primary care

## 2014-06-04 NOTE — Patient Instructions (Signed)
Your physician wants you to follow-up in:  12 months.  You will receive a reminder letter in the mail two months in advance. If you don't receive a letter, please call our office to schedule the follow-up appointment.   

## 2014-08-31 ENCOUNTER — Other Ambulatory Visit: Payer: Self-pay

## 2014-08-31 MED ORDER — ATORVASTATIN CALCIUM 80 MG PO TABS: ORAL_TABLET | ORAL | Status: DC

## 2015-04-14 DIAGNOSIS — G4700 Insomnia, unspecified: Secondary | ICD-10-CM | POA: Diagnosis not present

## 2015-04-14 DIAGNOSIS — E780 Pure hypercholesterolemia: Secondary | ICD-10-CM | POA: Diagnosis not present

## 2015-04-14 DIAGNOSIS — K5900 Constipation, unspecified: Secondary | ICD-10-CM | POA: Diagnosis not present

## 2015-04-14 DIAGNOSIS — I10 Essential (primary) hypertension: Secondary | ICD-10-CM | POA: Diagnosis not present

## 2015-06-22 ENCOUNTER — Ambulatory Visit (INDEPENDENT_AMBULATORY_CARE_PROVIDER_SITE_OTHER): Payer: Commercial Managed Care - HMO | Admitting: Cardiovascular Disease

## 2015-06-22 ENCOUNTER — Encounter: Payer: Self-pay | Admitting: Cardiovascular Disease

## 2015-06-22 VITALS — BP 150/70 | HR 64 | Ht 65.0 in | Wt 148.4 lb

## 2015-06-22 DIAGNOSIS — E785 Hyperlipidemia, unspecified: Secondary | ICD-10-CM

## 2015-06-22 DIAGNOSIS — I10 Essential (primary) hypertension: Secondary | ICD-10-CM

## 2015-06-22 DIAGNOSIS — I2510 Atherosclerotic heart disease of native coronary artery without angina pectoris: Secondary | ICD-10-CM

## 2015-06-22 NOTE — Progress Notes (Signed)
Chief Complaint  Patient presents with  . Follow-up    History of Present Illness: 81 yo WF with history of HTN, hyperlipidemia and CAD here today for cardiac follow up. She had a NSTEMI and underwent cardiac cath on 09/07/10 and was found to have three vessel CAD. Drug eluting stents were placed in the RCA, LAD and Diagonal branch.   She is here today for follow up. She has had no chest pain, SOB, palpitations, near syncope, syncope, LE edema, PND or orthopnea.   Primary Care Physician: [name], [facility].   Last Lipid Profile: Followed in primary care.   Past Medical History  Diagnosis Date  . MI (myocardial infarction)     Non-ST elevation 9/11  . CAD (coronary artery disease)     s/p cath 09/07/10 w/3 vessel CAD, DES in RCA, Diagonal & LAD  . HTN (hypertension)   . Hyperlipemia     Past Surgical History  Procedure Laterality Date  . Partial hysterectomy      Current Outpatient Prescriptions  Medication Sig Dispense Refill  . amitriptyline (ELAVIL) 50 MG tablet Take 50 mg by mouth at bedtime as needed for sleep. 1-2 tabs qhs prn as needed for sleep    . amLODipine (NORVASC) 5 MG tablet TAKE 1 TABLET BY MOUTH EVERY DAY 30 tablet 6  . aspirin 81 MG tablet Take 81 mg by mouth daily.    Marland Kitchen atorvastatin (LIPITOR) 80 MG tablet TAKE 1 TABLET BY MOUTH EVERY DAY 90 tablet 3  . BIOTIN 5000 PO Take 5,000 mg by mouth daily.     . Calcium Carbonate-Vitamin D (OYSTER SHELL CALCIUM 500 + D PO) Take 1 capsule by mouth daily.     . Cholecalciferol (VITAMIN D-3 PO) Take 1 tablet by mouth daily.     . fish oil-omega-3 fatty acids 1000 MG capsule Take 1 g by mouth 2 (two) times daily.      Marland Kitchen losartan-hydrochlorothiazide (HYZAAR) 100-25 MG per tablet Take 1 tablet by mouth daily.     . metoprolol (LOPRESSOR) 50 MG tablet Take 50 mg by mouth 2 (two) times daily.     . Multiple Vitamins-Minerals (ICAPS MV PO) Take 1 capsule by mouth daily.      . nitroGLYCERIN (NITROSTAT) 0.4  MG SL tablet Place 1 tablet (0.4 mg total) under the tongue every 5 (five) minutes as needed for chest pain. 25 tablet 6  . zolpidem (AMBIEN) 10 MG tablet Take 10 mg by mouth daily.     No current facility-administered medications for this visit.    Allergies  Allergen Reactions  . Codeine     History   Social History  . Marital Status: Single    Spouse Name: N/A  . Number of Children: N/A  . Years of Education: N/A   Occupational History  . Not on file.   Social History Main Topics  . Smoking status: Never Smoker   . Smokeless tobacco: Not on file  . Alcohol Use: No  . Drug Use: Not on file  . Sexual Activity: Not on file   Other Topics Concern  . Not on file   Social History Narrative    Family History  Problem Relation Age of Onset  . Heart attack Mother   . Coronary artery disease Brother   . Coronary artery disease Other     diagnosis of CAD but not premature diagnosis    Review of Systems:  As stated in the HPI and otherwise  negative.   BP 150/70 mmHg  Pulse 64  Ht 5\' 5"  (1.651 m)  Wt 148 lb 6.4 oz (67.314 kg)  BMI 24.70 kg/m2  Physical Examination: General: Well developed, well nourished, NAD HEENT: OP clear, mucus membranes moist SKIN: warm, dry. No rashes. Neuro: No focal deficits Musculoskeletal: Muscle strength 5/5 all ext Psychiatric: Mood and affect normal Neck: No JVD, no carotid bruits, no thyromegaly, no lymphadenopathy. Lungs:Clear bilaterally, no wheezes, rhonci, crackles Cardiovascular: Regular rate and rhythm. No murmurs, gallops or rubs. Abdomen:Soft. Bowel sounds present. Non-tender.  Extremities: No lower extremity edema. Pulses are 2 + in the bilateral DP/PT.  EKG:  EKG is ordered today. The ekg ordered today demonstrates NSR, rate 64 bpm. Incomplete RBBB. Non-specific ST and T wave abn  Recent Labs: No results found for requested labs within last 365 days.   Lipid Panel Followed in primary care   Wt Readings from Last 3  Encounters:  06/22/15 148 lb 6.4 oz (67.314 kg)  06/04/14 142 lb (64.411 kg)  06/03/13 149 lb (67.586 kg)     Other studies Reviewed: Additional studies/ records that were reviewed today include: . Review of the above records demonstrates:    Assessment and Plan:   1. CAD: Stable. No angina. Continue current meds including ASA, beta blocker and statin.  2. HTN: BP slightly elevated today. She will follow at home over next few weeks and alert primary care if still elevated. No changes.   3. HLD: Continue statin. Lipids followed in primary care  Current medicines are reviewed at length with the patient today.  The patient does not have concerns regarding medicines.  The following changes have been made:  no change  Labs/ tests ordered today include:   Orders Placed This Encounter  Procedures  . EKG 12-Lead    Disposition:   FU with me in 12  months  Signed, Lauree Chandler, MD 06/23/2015 12:32 PM    [facility] [address], [place], [place]  [address] Phone: [phone]; Fax: [phone]

## 2015-06-22 NOTE — Patient Instructions (Signed)

## 2015-10-25 DIAGNOSIS — Z961 Presence of intraocular lens: Secondary | ICD-10-CM | POA: Diagnosis not present

## 2015-10-25 DIAGNOSIS — H524 Presbyopia: Secondary | ICD-10-CM | POA: Diagnosis not present

## 2015-10-28 DIAGNOSIS — Z0000 Encounter for general adult medical examination without abnormal findings: Secondary | ICD-10-CM | POA: Diagnosis not present

## 2015-10-28 DIAGNOSIS — G4700 Insomnia, unspecified: Secondary | ICD-10-CM | POA: Diagnosis not present

## 2015-10-28 DIAGNOSIS — Z1389 Encounter for screening for other disorder: Secondary | ICD-10-CM | POA: Diagnosis not present

## 2015-10-28 DIAGNOSIS — I10 Essential (primary) hypertension: Secondary | ICD-10-CM | POA: Diagnosis not present

## 2015-10-28 DIAGNOSIS — E7800 Pure hypercholesterolemia, unspecified: Secondary | ICD-10-CM | POA: Diagnosis not present

## 2015-10-28 DIAGNOSIS — Z1239 Encounter for other screening for malignant neoplasm of breast: Secondary | ICD-10-CM | POA: Diagnosis not present

## 2015-11-03 DIAGNOSIS — W19XXXA Unspecified fall, initial encounter: Secondary | ICD-10-CM | POA: Diagnosis not present

## 2015-11-03 DIAGNOSIS — R51 Headache: Secondary | ICD-10-CM | POA: Diagnosis not present

## 2015-11-03 DIAGNOSIS — M542 Cervicalgia: Secondary | ICD-10-CM | POA: Diagnosis not present

## 2015-11-10 DIAGNOSIS — R55 Syncope and collapse: Secondary | ICD-10-CM | POA: Diagnosis not present

## 2015-11-10 DIAGNOSIS — S022XXD Fracture of nasal bones, subsequent encounter for fracture with routine healing: Secondary | ICD-10-CM | POA: Diagnosis not present

## 2015-11-12 DIAGNOSIS — R55 Syncope and collapse: Secondary | ICD-10-CM | POA: Diagnosis not present

## 2015-11-16 DIAGNOSIS — R55 Syncope and collapse: Secondary | ICD-10-CM | POA: Insufficient documentation

## 2015-11-17 ENCOUNTER — Telehealth: Payer: Self-pay | Admitting: *Deleted

## 2015-11-17 NOTE — Telephone Encounter (Signed)
Received office note from [facility] noting pt had syncope resulting in nasal fracture.  Note states pt reports several other syncopal episodes.  These have occurred since last office visit with Dr. Angelena Form.  I placed call to pt to schedule appointment with Dr. Angelena Form but message states call cannot be completed as dialed.  I tried several times but received same message each time.  I tried emergency contact listed for pt but this number has been changed and the new number is unknown.  Dr. Angelena Form can see pt on 11/22/15 at 10:45 or 4:00.  Will ask triage to try to reach pt tomorrow.

## 2015-11-17 NOTE — Telephone Encounter (Signed)
Pt has appt for event monitor tomorrow.  This has been ordered by primary care.  Will schedule pt to see Dr. Angelena Form after pt finishes wearing monitor as she is being followed by primary care.

## 2015-11-18 ENCOUNTER — Ambulatory Visit (INDEPENDENT_AMBULATORY_CARE_PROVIDER_SITE_OTHER): Payer: Commercial Managed Care - HMO

## 2015-11-18 DIAGNOSIS — R55 Syncope and collapse: Secondary | ICD-10-CM

## 2015-11-18 NOTE — Telephone Encounter (Signed)
Follow up    [name] calling to see if her insurance and if she can have a home health aid

## 2015-11-18 NOTE — Telephone Encounter (Signed)
Pt advised to contact PCP, Dr Alyson Ingles for information about home health services, pt verbalized understanding.   Pt states she did have monitor today. I was unable to find appt for pt after monitor completed, pt advised I will forward to [facility] to contact her about appt with Dr Angelena Form once monitor has been completed.  Pt aware Fraser Din will be back on Monday.

## 2015-11-23 NOTE — Telephone Encounter (Signed)
Spoke with pt and told her I could not speak with Darleen Crocker as there was no DPR on file and she was not listed in her contact information.  Pt reports this is the person who will bring her to appointments.  I asked pt to fill out DPR paperwork when she is in the office again. Pt reports she has seen Dr. Alyson Ingles since her syncopal episode and event monitor ordered.  This was placed on December 8th.  Pt reports she has had no recent syncopal events.  I scheduled pt to see Dr. Angelena Form on January 11,2016 at 4:00.  I instructed her to let us know if she has problems prior to this appt.

## 2015-11-23 NOTE — Telephone Encounter (Signed)
F/u  Pt friend calling to speak w/ RN to sched holter f/u w/ Dr Angelena Form. Please call back and discuss.

## 2015-12-01 DIAGNOSIS — R296 Repeated falls: Secondary | ICD-10-CM | POA: Diagnosis not present

## 2015-12-01 DIAGNOSIS — M542 Cervicalgia: Secondary | ICD-10-CM | POA: Diagnosis not present

## 2015-12-01 DIAGNOSIS — M859 Disorder of bone density and structure, unspecified: Secondary | ICD-10-CM | POA: Diagnosis not present

## 2015-12-22 ENCOUNTER — Encounter: Payer: Self-pay | Admitting: Cardiovascular Disease

## 2015-12-22 ENCOUNTER — Ambulatory Visit (INDEPENDENT_AMBULATORY_CARE_PROVIDER_SITE_OTHER): Payer: Commercial Managed Care - HMO | Admitting: Cardiovascular Disease

## 2015-12-22 VITALS — BP 150/78 | HR 64 | Ht 65.0 in | Wt 144.4 lb

## 2015-12-22 DIAGNOSIS — R55 Syncope and collapse: Secondary | ICD-10-CM

## 2015-12-22 DIAGNOSIS — E785 Hyperlipidemia, unspecified: Secondary | ICD-10-CM | POA: Diagnosis not present

## 2015-12-22 DIAGNOSIS — I10 Essential (primary) hypertension: Secondary | ICD-10-CM | POA: Diagnosis not present

## 2015-12-22 DIAGNOSIS — I2510 Atherosclerotic heart disease of native coronary artery without angina pectoris: Secondary | ICD-10-CM

## 2015-12-22 NOTE — Patient Instructions (Signed)

## 2015-12-22 NOTE — Progress Notes (Signed)
Chief Complaint  Patient presents with  . Follow-up  . Hyperlipidemia    monitor results  . Coronary Artery Disease    History of Present Illness: 82 yo WF with history of HTN, hyperlipidemia and CAD here today for cardiac follow up. She had a NSTEMI and underwent cardiac cath on 09/07/10 and was found to have three vessel CAD. Drug eluting stents were placed in the RCA, LAD and Diagonal branch. She has done well since then. Syncopal event in December 2016. No forewarning. She had no palpitations. Event monitor with sinus, no arrhythmias.   She is here today for follow up. She has had no chest pain, SOB, palpitations.    Primary Care Physician: [name], [facility].   Last Lipid Profile: Followed in primary care.   Past Medical History  Diagnosis Date  . MI (myocardial infarction) ([place])     Non-ST elevation 9/11  . CAD (coronary artery disease)     s/p cath 09/07/10 w/3 vessel CAD, DES in RCA, Diagonal & LAD  . HTN (hypertension)   . Hyperlipemia     Past Surgical History  Procedure Laterality Date  . Partial hysterectomy      Current Outpatient Prescriptions  Medication Sig Dispense Refill  . amitriptyline (ELAVIL) 50 MG tablet Take 50 mg by mouth at bedtime as needed for sleep. 1-2 tabs qhs prn as needed for sleep    . amLODipine (NORVASC) 5 MG tablet TAKE 1 TABLET BY MOUTH EVERY DAY 30 tablet 6  . aspirin 81 MG tablet Take 81 mg by mouth daily.    Marland Kitchen atorvastatin (LIPITOR) 80 MG tablet TAKE 1 TABLET BY MOUTH EVERY DAY 90 tablet 3  . BIOTIN 5000 PO Take 5,000 mg by mouth daily.     . Calcium Carbonate-Vitamin D (OYSTER SHELL CALCIUM 500 + D PO) Take 1 capsule by mouth daily.     . Cholecalciferol (VITAMIN D-3 PO) Take 1 tablet by mouth daily.     . fish oil-omega-3 fatty acids 1000 MG capsule Take 1 g by mouth 2 (two) times daily.      Marland Kitchen losartan-hydrochlorothiazide (HYZAAR) 100-25 MG per tablet Take 1 tablet by mouth daily.     . metoprolol (LOPRESSOR) 50  MG tablet Take 50 mg by mouth 2 (two) times daily.     . Multiple Vitamins-Minerals (ICAPS MV PO) Take 1 capsule by mouth daily.      . nitroGLYCERIN (NITROSTAT) 0.4 MG SL tablet Place 1 tablet (0.4 mg total) under the tongue every 5 (five) minutes as needed for chest pain. 25 tablet 6  . zolpidem (AMBIEN) 10 MG tablet Take 10 mg by mouth daily.     No current facility-administered medications for this visit.    Allergies  Allergen Reactions  . Codeine Other (See Comments)    Makes her severely depressed     Social History   Social History  . Marital Status: Single    Spouse Name: N/A  . Number of Children: N/A  . Years of Education: N/A   Occupational History  . Not on file.   Social History Main Topics  . Smoking status: Never Smoker   . Smokeless tobacco: Not on file  . Alcohol Use: No  . Drug Use: Not on file  . Sexual Activity: Not on file   Other Topics Concern  . Not on file   Social History Narrative    Family History  Problem Relation Age of Onset  . Heart  attack Mother   . Coronary artery disease Brother   . Coronary artery disease Other     diagnosis of CAD but not premature diagnosis    Review of Systems:  As stated in the HPI and otherwise negative.   BP 150/78 mmHg  Pulse 64  Ht 5\' 5"  (1.651 m)  Wt 144 lb 6.4 oz (65.499 kg)  BMI 24.03 kg/m2  SpO2 99%  Physical Examination: General: Well developed, well nourished, NAD HEENT: OP clear, mucus membranes moist SKIN: warm, dry. No rashes. Neuro: No focal deficits Musculoskeletal: Muscle strength 5/5 all ext Psychiatric: Mood and affect normal Neck: No JVD, no carotid bruits, no thyromegaly, no lymphadenopathy. Lungs:Clear bilaterally, no wheezes, rhonci, crackles Cardiovascular: Regular rate and rhythm. No murmurs, gallops or rubs. Abdomen:Soft. Bowel sounds present. Non-tender.  Extremities: No lower extremity edema. Pulses are 2 + in the bilateral DP/PT.  EKG:  EKG is not ordered  today. The ekg ordered today demonstrates   Recent Labs: No results found for requested labs within last 365 days.   Lipid Panel Followed in primary care   Wt Readings from Last 3 Encounters:  12/22/15 144 lb 6.4 oz (65.499 kg)  06/22/15 148 lb 6.4 oz (67.314 kg)  06/04/14 142 lb (64.411 kg)     Other studies Reviewed: Additional studies/ records that were reviewed today include: . Review of the above records demonstrates:    Assessment and Plan:   1. CAD: Stable. No angina. Continue current meds including ASA, beta blocker and statin.  2. HTN: BP is stable. No changes.   3. HLD: Continue statin. Lipids followed in primary care  4. Syncope: Event monitor without arrhythmias. Unclear cause of syncope. She passed out after first standing from bed.   Current medicines are reviewed at length with the patient today.  The patient does not have concerns regarding medicines.  The following changes have been made:  no change  Labs/ tests ordered today include:   No orders of the defined types were placed in this encounter.    Disposition:   FU with me in 12  months  Signed, Lauree Chandler, MD 12/22/2015 4:27 PM    [facility] [place], [place], [place]  [address] Phone: [phone]; Fax: [phone]

## 2015-12-31 DIAGNOSIS — R413 Other amnesia: Secondary | ICD-10-CM | POA: Diagnosis not present

## 2015-12-31 DIAGNOSIS — R296 Repeated falls: Secondary | ICD-10-CM | POA: Diagnosis not present

## 2015-12-31 DIAGNOSIS — F329 Major depressive disorder, single episode, unspecified: Secondary | ICD-10-CM | POA: Diagnosis not present

## 2015-12-31 DIAGNOSIS — I10 Essential (primary) hypertension: Secondary | ICD-10-CM | POA: Diagnosis not present

## 2015-12-31 DIAGNOSIS — M8580 Other specified disorders of bone density and structure, unspecified site: Secondary | ICD-10-CM | POA: Diagnosis not present

## 2015-12-31 DIAGNOSIS — E785 Hyperlipidemia, unspecified: Secondary | ICD-10-CM | POA: Diagnosis not present

## 2015-12-31 DIAGNOSIS — M109 Gout, unspecified: Secondary | ICD-10-CM | POA: Diagnosis not present

## 2015-12-31 DIAGNOSIS — R2689 Other abnormalities of gait and mobility: Secondary | ICD-10-CM | POA: Diagnosis not present

## 2015-12-31 DIAGNOSIS — I2510 Atherosclerotic heart disease of native coronary artery without angina pectoris: Secondary | ICD-10-CM | POA: Diagnosis not present

## 2016-04-25 DIAGNOSIS — E7800 Pure hypercholesterolemia, unspecified: Secondary | ICD-10-CM | POA: Diagnosis not present

## 2016-04-25 DIAGNOSIS — I10 Essential (primary) hypertension: Secondary | ICD-10-CM | POA: Diagnosis not present

## 2016-04-25 DIAGNOSIS — L259 Unspecified contact dermatitis, unspecified cause: Secondary | ICD-10-CM | POA: Diagnosis not present

## 2016-04-25 DIAGNOSIS — G4700 Insomnia, unspecified: Secondary | ICD-10-CM | POA: Diagnosis not present

## 2016-05-10 DIAGNOSIS — H353130 Nonexudative age-related macular degeneration, bilateral, stage unspecified: Secondary | ICD-10-CM | POA: Diagnosis not present

## 2016-05-10 DIAGNOSIS — Z961 Presence of intraocular lens: Secondary | ICD-10-CM | POA: Diagnosis not present

## 2016-05-10 DIAGNOSIS — H5203 Hypermetropia, bilateral: Secondary | ICD-10-CM | POA: Diagnosis not present

## 2016-05-10 DIAGNOSIS — H52203 Unspecified astigmatism, bilateral: Secondary | ICD-10-CM | POA: Diagnosis not present

## 2016-05-10 DIAGNOSIS — H3561 Retinal hemorrhage, right eye: Secondary | ICD-10-CM | POA: Diagnosis not present

## 2016-05-30 DIAGNOSIS — H348312 Tributary (branch) retinal vein occlusion, right eye, stable: Secondary | ICD-10-CM | POA: Diagnosis not present

## 2016-05-30 DIAGNOSIS — H353130 Nonexudative age-related macular degeneration, bilateral, stage unspecified: Secondary | ICD-10-CM | POA: Diagnosis not present

## 2016-05-30 DIAGNOSIS — H35033 Hypertensive retinopathy, bilateral: Secondary | ICD-10-CM | POA: Diagnosis not present

## 2016-05-30 DIAGNOSIS — Z961 Presence of intraocular lens: Secondary | ICD-10-CM | POA: Diagnosis not present

## 2016-05-30 DIAGNOSIS — H35373 Puckering of macula, bilateral: Secondary | ICD-10-CM | POA: Diagnosis not present

## 2016-08-22 ENCOUNTER — Emergency Department (HOSPITAL_COMMUNITY)
Admission: EM | Admit: 2016-08-22 | Discharge: 2016-08-23 | Disposition: A | Discharge status: Home / Self Care | Payer: Commercial Managed Care - HMO | Attending: Emergency Medicine | Admitting: Emergency Medicine

## 2016-08-22 ENCOUNTER — Encounter (HOSPITAL_COMMUNITY): Payer: Self-pay

## 2016-08-22 DIAGNOSIS — Z791 Long term (current) use of non-steroidal anti-inflammatories (NSAID): Secondary | ICD-10-CM | POA: Diagnosis not present

## 2016-08-22 DIAGNOSIS — Y92009 Unspecified place in unspecified non-institutional (private) residence as the place of occurrence of the external cause: Secondary | ICD-10-CM | POA: Insufficient documentation

## 2016-08-22 DIAGNOSIS — Z79899 Other long term (current) drug therapy: Secondary | ICD-10-CM | POA: Diagnosis not present

## 2016-08-22 DIAGNOSIS — Z7982 Long term (current) use of aspirin: Secondary | ICD-10-CM | POA: Insufficient documentation

## 2016-08-22 DIAGNOSIS — Y999 Unspecified external cause status: Secondary | ICD-10-CM | POA: Insufficient documentation

## 2016-08-22 DIAGNOSIS — I10 Essential (primary) hypertension: Secondary | ICD-10-CM | POA: Insufficient documentation

## 2016-08-22 DIAGNOSIS — Y939 Activity, unspecified: Secondary | ICD-10-CM | POA: Diagnosis not present

## 2016-08-22 DIAGNOSIS — W19XXXA Unspecified fall, initial encounter: Secondary | ICD-10-CM | POA: Diagnosis not present

## 2016-08-22 DIAGNOSIS — M25561 Pain in right knee: Secondary | ICD-10-CM

## 2016-08-22 DIAGNOSIS — M25461 Effusion, right knee: Secondary | ICD-10-CM | POA: Diagnosis not present

## 2016-08-22 DIAGNOSIS — I2510 Atherosclerotic heart disease of native coronary artery without angina pectoris: Secondary | ICD-10-CM | POA: Insufficient documentation

## 2016-08-22 DIAGNOSIS — S82141A Displaced bicondylar fracture of right tibia, initial encounter for closed fracture: Secondary | ICD-10-CM

## 2016-08-22 NOTE — ED Notes (Signed)
Bed: [id number] Expected date:  Expected time:  Means of arrival:  Comments: Fall, knee pain

## 2016-08-22 NOTE — ED Triage Notes (Addendum)
Pt BIB [facility] from home. Pt reports falling yesterday after her knee gave out. Now complains of R knee pain when bearing weight. Pt has been unable to ambulate since fall. Hx of chronic R knee and lower back pain. Denies LOC. Denies hitting head. Denies neck pain. Family en route. A&Ox4. Pt uses a walker at home.

## 2016-08-23 ENCOUNTER — Emergency Department (HOSPITAL_COMMUNITY): Payer: Commercial Managed Care - HMO

## 2016-08-23 DIAGNOSIS — M25461 Effusion, right knee: Secondary | ICD-10-CM | POA: Diagnosis not present

## 2016-08-23 DIAGNOSIS — M25569 Pain in unspecified knee: Secondary | ICD-10-CM | POA: Diagnosis not present

## 2016-08-23 MED ORDER — TRAMADOL HCL 50 MG PO TABS: 50.0000 mg | ORAL_TABLET | Freq: Two times a day (BID) | ORAL | 0 refills | Status: DC | PRN

## 2016-08-23 MED ORDER — KETOROLAC TROMETHAMINE 60 MG/2ML IM SOLN
30.0000 mg | Freq: Once | INTRAMUSCULAR | Status: AC
  Administered 2016-08-23: 30 mg via INTRAMUSCULAR
  Filled 2016-08-23: qty 2

## 2016-08-23 MED ORDER — OXYCODONE-ACETAMINOPHEN 5-325 MG PO TABS
ORAL_TABLET | ORAL | Status: AC
  Administered 2016-08-23: 1 via ORAL
  Filled 2016-08-23: qty 1

## 2016-08-23 MED ORDER — TRAMADOL HCL 50 MG PO TABS
50.0000 mg | ORAL_TABLET | Freq: Once | ORAL | Status: AC
  Administered 2016-08-23: 50 mg via ORAL
  Filled 2016-08-23: qty 1

## 2016-08-23 MED ORDER — METOPROLOL TARTRATE 25 MG PO TABS
50.0000 mg | ORAL_TABLET | Freq: Once | ORAL | Status: AC
  Administered 2016-08-23: 50 mg via ORAL
  Filled 2016-08-23: qty 2

## 2016-08-23 MED ORDER — MORPHINE SULFATE (PF) 4 MG/ML IV SOLN
4.0000 mg | Freq: Once | INTRAVENOUS | Status: AC
  Administered 2016-08-23: 4 mg via INTRAMUSCULAR
  Filled 2016-08-23: qty 1

## 2016-08-23 MED ORDER — OXYCODONE-ACETAMINOPHEN 5-325 MG PO TABS
1.0000 | ORAL_TABLET | Freq: Once | ORAL | Status: AC
  Administered 2016-08-23: 1 via ORAL
  Filled 2016-08-23: qty 1

## 2016-08-23 MED ORDER — LOSARTAN POTASSIUM 50 MG PO TABS
100.0000 mg | ORAL_TABLET | Freq: Once | ORAL | Status: AC
  Administered 2016-08-23: 100 mg via ORAL
  Filled 2016-08-23: qty 2

## 2016-08-23 MED ORDER — HYDROCHLOROTHIAZIDE 12.5 MG PO CAPS
25.0000 mg | ORAL_CAPSULE | Freq: Once | ORAL | Status: AC
  Administered 2016-08-23: 25 mg via ORAL
  Filled 2016-08-23: qty 2

## 2016-08-23 NOTE — ED Provider Notes (Signed)
Rebecca Ford is a 82 y.o. female, with a history of COPD, hypertension, and MI, presenting to the ED with acute on chronic right knee pain. This pain was exacerbated by a fall 2 days ago.   [name] and Rebecca Ford, is at the bedside. Concern is that the patient needs help at home and her daughter can not help her mother due to [facility] recent surgery. Saw Dr. Mayer Camel three years ago, a knee replacement was recommended at that time. Pt states she just never called to set up the surgery.   Will Dansie, PA-C HPI: "Rebecca Ford is a 82 y.o. female brought in by EMS who presents to the Emergency Department complaining of exacerbation of her chronic right knee pain s/p falling yesterday. Pt reports her right knee "gave out" on her after getting out of bed and she landed on her right knee. She endorses associated swelling. Pt denies hitting her head or losing consciousness. Pt states she "drug herself" around her house all day yesterday and could not walk due to pain. Pt ambulates with a walker at baseline. She reports her right knee pain is "all over." She reports she has been told by a physician that she needs a right knee replacement, but has declined in the past. She wants me to give her a knee replacement today.  Pt denies any other pains since the fall. She further denies CP, SOB, abdominal pain, nausea, vomiting, numbness/tingling, paresthesias, neuro deficits, weakness, vision changes, fever, or any other associated symptoms. Pt lives by herself at home."  Past Medical History:  Diagnosis Date  . CAD (coronary artery disease)    s/p cath 09/07/10 w/3 vessel CAD, DES in RCA, Diagonal & LAD  . HTN (hypertension)   . Hyperlipemia   . MI (myocardial infarction) ([place])    Non-ST elevation 9/11    Physical Exam  BP 183/83   Pulse 85   Temp 98.3 F (36.8 C) (Oral)   Resp 15   SpO2 98%   Physical Exam  Constitutional: She appears well-developed and well-nourished. No distress.   HENT:  Head: Normocephalic and atraumatic.  Eyes: Conjunctivae are normal.  Neck: Neck supple.  Cardiovascular: Normal rate, regular rhythm and intact distal pulses.   Pulmonary/Chest: Effort normal. No respiratory distress.  Abdominal: There is no guarding.  Musculoskeletal: She exhibits no edema or tenderness.  Lymphadenopathy:    She has no cervical adenopathy.  Neurological: She is alert. She has normal reflexes.  No sensory deficits. Strength 5/5 bilateral lower extremities.  Skin: Skin is warm and dry. She is not diaphoretic.  Psychiatric: She has a normal mood and affect. Her behavior is normal.  Nursing note and vitals reviewed.   ED Course  Procedures  MDM   6:00 AM Received patient care handoff report from Will Dansie, PA-C.  Case management consult for possible home health assistance. Social work consult was also placed for the same. Social worker states that this will best be handled by case management. Case manager, Maudie Mercury, came to talk to the patient to assess for home health.  Loni Dolly, PA for [employer], was contacted and stated he would get the patient into the office as soon as possible. Home health assessment was set up. Patient is to be nonweightbearing.   [name] was upset during this patient's visit. She was upset because she wanted the orthopedic surgeon to evaluate the patient in the ED and schedule her for surgery immediately or admit her to  the [facility] rehabilitation unit and keep her there until her knee replacement could be performed. It was explained to the patient's daughter that this was not at least things work. Patient meets no admission criteria. On-call orthopedic surgeon's are for emergencies. Patient does not fit him or criteria. 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 I know how this process works. You could make this surgery happen if she really wanted  to."  Vitals:   08/23/16 0800 08/23/16 0900 08/23/16 0901 08/23/16 1000  BP: 173/75 167/77 167/77 183/83  Pulse: 75 79 76 85  Resp: 16 13 15 15   Temp:      TempSrc:      SpO2: 97% 100% 97% 98%   Vitals:   08/23/16 1201 08/23/16 1300 08/23/16 1355 08/23/16 1543  BP: 149/78 188/77 (!) 203/92 190/83  Pulse: 80 84 79 72  Resp: 15 17 16 16   Temp:      TempSrc:      SpO2: 95% 97% 98% 99%      Lorayne Bender, PA-C 08/23/16 1313   While patient was waiting to be taken home by [facility], it was noted that her blood pressure seemed to be on the rise. Patient was reexamined and has no symptoms of hypertensive emergency. I suspect that this rise in blood pressure may be due to the fact that the patient took her home hypertension medications late in the day (at around 1340) and did not take them this morning. Her second dose of metoprolol was ordered. This line of decision-making was discussed with Dr. Sherry Ruffing. Patient was again reassessed prior to ultimate discharge and continues to have no signs of hypertensive emergency. Blood pressure is trending down.  Vitals:   08/23/16 1000 08/23/16 1100 08/23/16 1201 08/23/16 1300  BP: 183/83 158/88 149/78 188/77  Pulse: 85 88 80 84  Resp: 15 14 15 17   Temp:      TempSrc:      SpO2: 98% 98% 95% 97%   Vitals:   08/23/16 1201 08/23/16 1300 08/23/16 1355 08/23/16 1543  BP: 149/78 188/77 (!) 203/92 190/83  Pulse: 80 84 79 72  Resp: 15 17 16 16   Temp:      TempSrc:      SpO2: 95% 97% 98% 99%      Lorayne Bender, PA-C 08/23/16 1558    Merryl Hacker, MD 08/24/16 0040

## 2016-08-23 NOTE — Progress Notes (Addendum)
ED CM reviewed case with lead Cm  Cm received a call from Manuela Schwartz of [facility] who mention pt on fixed income, pt again agreed to home health care, pt had been previously offered services by [facility] 2 times prior to this one, pt had cancelled one offer for services and wendy had cancelled last offer for services.  Manuela Schwartz recommended PT evaluation but ED RN had already called for [facility] by time CM able to see pt again. RN called for [facility] after pt and daughter seen by Manuela Schwartz, [facility].   CM clarified with Abigail Butts that she is actually the pt's "god daughter but I have always considered her my mother and we say daughter" Abigail Butts confirms again pt has a bedside commode, walker and cane at home No bed, no w/c but states home too small for bed and w/c Abigail Butts confirms a call back from [place] and pt is still to be seen in [place] confirms pt will still need a referral from her pcp Reade per [place] "we have worked it out and have a plan"  Per Abigail Butts  Cm assisted with SCAT application and fax to [employer] at [phone] after discussing transportation need with Kennyth Lose at [phone] No return call from PDN agencies to [facility]  1444 ED CM ask for pt to be sent home with bedpan

## 2016-08-23 NOTE — Progress Notes (Signed)
ED CM spoke with staff from [facility] who not sure pt can be seen today for start of services [facility] Tina referred Cm to Tera RN [phone] to see if she can assess pt for possible start date of today Pending call from [place] CM went to updated pt and dtr of Cm completion of home health with [facility]. Daughter noted to be laying on the floor on her back Cm not sure if she has made contact calls to PDN providers Pt confirms [place], niece is not a primary care provider option for her [place] states she would only consider assisting if pt had surgery and went to rehab and was after rehab able to walk on "two stable legs" Cm reviewed Cm calls to [facility] Cm reviewed and provided a list of [place] and non medical transportation services to include SCAT Asked if daughter would assist pt with SCAT applications.  Again pt does not want to go to stay with daughter Pt again confirming she does not have money to pay PDN services  [phone] CM assistant director updated  [phone] updated ED Rn, ED charge  [phone] gave daughter the SCAT 3 part application

## 2016-08-23 NOTE — ED Notes (Signed)
After R knee immobilizer application, pulses on R foot are strong and cap refill is <3 sec.

## 2016-08-23 NOTE — Progress Notes (Signed)
Pt gave CM permission to "help me an anyway you can"  Pt agreed to home health after direct questioning by CM with ED PA and daughter present Daughter states pt has previously refused home health services offers THAN consult entered in [facility]

## 2016-08-23 NOTE — Progress Notes (Signed)
Daughter informs ED CM she is speaking with [facility] now ED RN encouraged to see if pt is able to ambulate while in [facility] ED  ED RN asked CM how pt was getting home - ? Ambulation may assist with answer

## 2016-08-23 NOTE — Progress Notes (Signed)
ED CM called and left a message for Glade Lloyd of [facility] [phone]  to see if she may know of an agency possibly available for same day PDN services

## 2016-08-23 NOTE — Progress Notes (Signed)
CSW staffed patient's case with EDP this AM for social work needs and was informed patient would need case management.  Genice Rouge [phone] ED CSW 08/23/2016 9:32 AM

## 2016-08-23 NOTE — ED Notes (Signed)
[facility] called for transportation  

## 2016-08-23 NOTE — Discharge Instructions (Signed)
Continue contact with Dr. Damita Dunnings office to set up any future appointments. Contact the home health phone numbers that were given to you by the care manager. Ibuprofen, naproxen, or Tylenol for pain.

## 2016-08-23 NOTE — Progress Notes (Addendum)
Referral to [facility] Left message at [phone] to include referral info and CM ED mobile number and faxed referral to [phone]  1635 Cm spoke with Abigail Butts at [phone] to discuss calls to senior resources to put pt on mobile meals list, [facility] and [facility] referral Abigail Butts appreciative of services rendered Texted theses names to wendy so she could expect a call on her [phone] from senior resources, [facility] and [place] spoke with Claiborne Billings from [place] to review pt, needs again provide Abigail Butts number for a call to put pt on [facility] waiting list  Abigail Butts states she called to [place] office and pt f/u appt is at end of September 2017

## 2016-08-23 NOTE — ED Provider Notes (Signed)
[facility] DEPT Provider Note   CSN: [id number] Arrival date & time: 08/22/16  2338  By signing my name below, I, Dora Sims, attest that this documentation has been prepared under the direction and in the presence of Will Shamicka Inga, PA-C. Electronically Signed: Dora Sims, Scribe. 08/23/2016. 12:13 AM.  History   Chief Complaint Chief Complaint  Patient presents with  . Knee Pain    Right    The history is provided by the patient. No language interpreter was used.     HPI Comments: Rebecca Ford is a 82 y.o. female brought in by EMS who presents to the Emergency Department complaining of exacerbation of her chronic right knee pain s/p falling yesterday. Pt reports her right knee "gave out" on her after getting out of bed and she landed on her right knee. She endorses associated swelling. Pt denies hitting her head or losing consciousness. Pt states she "drug herself" around her house all day yesterday and could not walk due to pain. Pt ambulates with a walker at baseline. She reports her right knee pain is "all over." She reports she has been told by a physician that she needs a right knee replacement, but has declined in the past. She wants me to give her a knee replacement today.  Pt denies any other pains since the fall. She further denies CP, SOB, abdominal pain, nausea, vomiting, numbness/tingling, paresthesias, neuro deficits, weakness, vision changes, fever, or any other associated symptoms. Pt lives by herself at home.  Past Medical History:  Diagnosis Date  . CAD (coronary artery disease)    s/p cath 09/07/10 w/3 vessel CAD, DES in RCA, Diagonal & LAD  . HTN (hypertension)   . Hyperlipemia   . MI (myocardial infarction) ([place])    Non-ST elevation 9/11    Patient Active Problem List   Diagnosis Date Noted  . Syncope 11/16/2015  . 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 HYPERTENSION, BENIGN 09/20/2010  . CAD, NATIVE VESSEL 09/20/2010    Past Surgical History:    Procedure Laterality Date  . PARTIAL HYSTERECTOMY      OB History    No data available       Home Medications    Prior to Admission medications   Medication Sig Start Date End Date Taking? Authorizing Provider  amitriptyline (ELAVIL) 50 MG tablet Take 50 mg by mouth at bedtime as needed for sleep. 1-2 tabs qhs prn as needed for sleep    Historical Provider, MD  amLODipine (NORVASC) 5 MG tablet TAKE 1 TABLET BY MOUTH EVERY DAY 01/06/12   Burnell Blanks, MD  aspirin 81 MG tablet Take 81 mg by mouth daily.    Historical Provider, MD  atorvastatin (LIPITOR) 80 MG tablet TAKE 1 TABLET BY MOUTH EVERY DAY 08/31/14   Burnell Blanks, MD  BIOTIN 5000 PO Take 5,000 mg by mouth daily.     Historical Provider, MD  Calcium Carbonate-Vitamin D (OYSTER SHELL CALCIUM 500 + D PO) Take 1 capsule by mouth daily.     Historical Provider, MD  Cholecalciferol (VITAMIN D-3 PO) Take 1 tablet by mouth daily.     Historical Provider, MD  fish oil-omega-3 fatty acids 1000 MG capsule Take 1 g by mouth 2 (two) times daily.      Historical Provider, MD  losartan-hydrochlorothiazide (HYZAAR) 100-25 MG per tablet Take 1 tablet by mouth daily.  05/25/14   Historical Provider, MD  metoprolol (LOPRESSOR) 50 MG tablet Take 50 mg by mouth 2 (two)  times daily.  03/28/14   Historical Provider, MD  Multiple Vitamins-Minerals (ICAPS MV PO) Take 1 capsule by mouth daily.      Historical Provider, MD  nitroGLYCERIN (NITROSTAT) 0.4 MG SL tablet Place 1 tablet (0.4 mg total) under the tongue every 5 (five) minutes as needed for chest pain. 06/04/14   Burnell Blanks, MD  zolpidem (AMBIEN) 10 MG tablet Take 10 mg by mouth daily. 04/14/15   Historical Provider, MD    Family History Family History  Problem Relation Age of Onset  . Heart attack Mother   . Coronary artery disease Brother   . Coronary artery disease Other     diagnosis of CAD but not premature diagnosis    Social History Social History   Substance Use Topics  . Smoking status: Never Smoker  . Smokeless tobacco: Not on file  . Alcohol use No     Allergies   Codeine   Review of Systems Review of Systems  Constitutional: Negative for fever.  HENT: Negative for nosebleeds.   Eyes: Negative for visual disturbance.  Respiratory: Negative for cough and shortness of breath.   Cardiovascular: Negative for chest pain.  Gastrointestinal: Negative for abdominal pain, nausea and vomiting.  Genitourinary: Negative for difficulty urinating and dysuria.  Musculoskeletal: Positive for arthralgias (exacerbation of chronic right knee pain) and joint swelling (right knee). Negative for back pain and neck pain.  Skin: Negative for rash and wound.  Neurological: Negative for syncope, weakness and numbness.       Negative for sensation loss.    Physical Exam Updated Vital Signs BP 180/75   Pulse 71   Temp 98.3 F (36.8 C) (Oral)   Resp 20   SpO2 98%   Physical Exam  Constitutional: She appears well-developed and well-nourished. No distress.  Nontoxic appearing.  HENT:  Head: Normocephalic and atraumatic.  Right Ear: External ear normal.  Left Ear: External ear normal.  Mouth/Throat: Oropharynx is clear and moist.  No visible signs of head trauma.  Eyes: Conjunctivae and EOM are normal. Pupils are equal, round, and reactive to light. Right eye exhibits no discharge. Left eye exhibits no discharge.  Neck: Normal range of motion. Neck supple.  No midline neck tenderness.  Cardiovascular: Normal rate, regular rhythm, normal heart sounds and intact distal pulses.   Bilateral radial, posterior tibialis and dorsalis pedis pulses are intact.    Pulmonary/Chest: Effort normal and breath sounds normal. No respiratory distress. She has no wheezes. She has no rales. She exhibits no tenderness.  Lungs clear to auscultation bilaterally. Symmetric chest expansion bilaterally. No chest wall tenderness to palpation.  Abdominal: Soft.  There is no tenderness. There is no guarding.  Abdomen is soft and nontender.  Musculoskeletal: She exhibits edema and tenderness. She exhibits no deformity.  Patient has mild edema and tenderness overlying her right anterior knee. No right knee deformity. No right knee instability noted. No pelvic instability. Patient's bilateral ankle, shoulder, elbow and wrist joints are supple and nontender to palpation. No midline back tenderness to palpation. No calf edema or tenderness.  Lymphadenopathy:    She has no cervical adenopathy.  Neurological: She is alert. No cranial nerve deficit. Coordination normal.  The patient is alert and oriented. Cranial nerves are intact. Sensation is intact her bilateral upper and lower extremities. She has good strength to her bilateral upper and lower extremities.  Skin: Skin is warm and dry. Capillary refill takes less than 2 seconds. No rash noted. She is not diaphoretic.  No erythema. No pallor.  Psychiatric: She has a normal mood and affect. Her behavior is normal.  Nursing note and vitals reviewed.   ED Treatments / Results  Labs (all labs ordered are listed, but only abnormal results are displayed) Labs Reviewed - No data to display  EKG  EKG Interpretation None       Radiology Ct Knee Right Wo Contrast  Result Date: 08/23/2016 CLINICAL DATA:  [place] yesterday at home.  Abnormal radiographs. EXAM: CT OF THE right KNEE WITHOUT CONTRAST TECHNIQUE: Multidetector CT imaging of the right knee was performed according to the standard protocol. Multiplanar CT image reconstructions were also generated. COMPARISON:  Radiographs 08/23/2016 FINDINGS: There are severe lateral compartment degenerative changes with prominent subchondral sclerosis and cyst formation. Prominent osteophytes are also present. Milder degenerative changes are present in the medial and patellofemoral compartments. There is medial compartment chondrocalcinosis. No fracture is evident. The  radiographic abnormality probably represented superimposition of osteophytes on the chronically depressed lateral plateau. There is a small knee joint effusion. IMPRESSION: Negative for acute fracture. Small knee joint effusion. Severe lateral compartment arthritis. Electronically Signed   By: Andreas Newport M.D.   On: 08/23/2016 02:06   Dg Knee Complete 4 Views Right  Result Date: 08/23/2016 CLINICAL DATA:  [place] yesterday at home. EXAM: RIGHT KNEE - COMPLETE 4+ VIEW COMPARISON:  None. FINDINGS: Severe chronic degenerative sclerosis of the lateral compartment with marked osteophyte formation. Moderate genu valgus. Chondrocalcinosis. Irregularity at the posterior aspect of the lateral plateau is suspicious for superimposed plateau fracture. There is a small joint effusion. IMPRESSION: Suspicious for a lateral plateau fracture superimposed on severe degenerative changes. Small joint effusion. CT will be conclusive and is recommended. These results will be called to the ordering clinician or representative by the Radiologist Assistant, and communication documented in the PACS or zVision Dashboard. Electronically Signed   By: Andreas Newport M.D.   On: 08/23/2016 00:22    Procedures Procedures (including critical care time)  DIAGNOSTIC STUDIES: Oxygen Saturation is 96% on RA, adequate by my interpretation.    COORDINATION OF CARE: 12:13 AM Discussed treatment plan with pt at bedside and pt agreed to plan.  Medications Ordered in ED Medications  oxyCODONE-acetaminophen (PERCOCET/ROXICET) 5-325 MG per tablet (1 tablet Oral Given 08/23/16 0144)  morphine 4 MG/ML injection 4 mg (4 mg Intramuscular Given 08/23/16 0320)     Initial Impression / Assessment and Plan / ED Course  I have reviewed the triage vital signs and the nursing notes.  Pertinent labs & imaging results that were available during my care of the patient were reviewed by me and considered in my medical decision making (see chart  for details).  Clinical Course   This is a 82 y.o. female brought in by EMS who presents to the Emergency Department complaining of exacerbation of her chronic right knee pain s/p falling yesterday. Pt reports her right knee "gave out" on her after getting out of bed and she landed on her right knee. She endorses associated swelling. Pt denies hitting her head or losing consciousness. Pt states she "drug herself" around her house all day yesterday and could not walk due to pain. Pt ambulates with a walker at baseline. She reports her right knee pain is "all over." She reports she has been told by a physician that she needs a right knee replacement, but has declined in the past. She wants me to give her a knee replacement today.  Pt denies any other pains since  the fall. On exam the patient is afebrile and nontoxic appearing. She has no focal neurological deficits. She has tenderness and mild edema to her right anterior knee. No knee instability noted. No calf edema or tenderness. She is neurovascularly intact. Right knee x-ray is suspicious for a lateral plateau fracture. CT is suggested. CT of her right knee without contrast is negative for acute fracture. It does show a small knee joint effusion. I discussed these findings with the patient and her daughter. The daughter expresses concern that she has had difficulty ambulating for some time now. She reports she will not be aortic around at home with a knee immobilizer in place. She does have a walker and a cane at home. Will hold the patient overnight in the emergency department for case management and social work to see in the morning. Knee immobilizer placed. Plan is for discharge after case management social work see the patient. I advised the need to call her orthopedic surgeon Dr. Mayer Camel morning to make an appointment for follow-up. Plan is to discharge with Nor[place].  Will sign out to oncoming provider for disposition after case management sees the  patient.   This patient was discussed with and evaluated by Dr. Dina Rich who agrees with assessment and plan.   I personally performed the services described in this documentation, which was scribed in my presence. The recorded information has been reviewed and is accurate.       Final Clinical Impressions(s) / ED Diagnoses   Final diagnoses:  Right knee pain    New Prescriptions New Prescriptions   No medications on file     Waynetta Pean, PA-C 08/23/16 [place], PA-C 08/23/16 [place], MD 08/24/16 0040

## 2016-08-23 NOTE — Progress Notes (Addendum)
0938 ED CM received consult from ED clinical secretary. ED CM attempted to assess pt via telephone Pt noted with confusion during telephone conversation CM unable to determine if confusion due to [facility] or orientation Cm given permission from pt to speak with daughter, Abigail Butts (at time of call Abigail Butts had stepped out of room per pt) ED CM briefly reviewed pt chart review and this encounter notes, labs, imaging - Imaging indicated no fractures noted of injured extremity 1015 ED CM spoke with pt and daughter in [place] ED rm #19 Cm noted daughter with grimacing of pain and holding her right abdomen and pacing at intervals Daughter states she has a urinary catheter stent and was in pain  CM reviewed in details medicare guidelines, Choices of home health [facility]) (length of stay in home, types of [facility] staff available, coverage, primary caregiver, up to 24 hrs before services may be started) and choices of Private duty nursing (PDN-coverage, length of stay in the home types of staff available).  CM provided pt/family with a list of [place] home health agencies and PDN.  CM discussed EDP/NP/PA asking CM to assist with d/c home with home health services Cm confirmed with pt that she has a walker and prior to this ED admission her baseline was using walker for ambulation in the home. Daughter states pt home doorways are not feasible for a w/c and pt has difficulty with getting her walker through her doorways.  Daughter reports "this is not the first time she has fallen"  Pt states "Dr Mayer Camel has ask me to call him when I was ready for knee surgery" " I am ready for knee surgery now"  States Dr Mayer Camel has been trying to get her to do this "for two to three years"  Daughter informed Cm she was pt's only relative and care giver but now unable to lift five pounds" , Pt does live alone. Cm asked if pt could stay with daughter and was informed "no" Pt and daughter voiced interest in having [facility] on call staff to come to  [facility] ED to see pt, Abigail Butts wanting pt to go to [facility] "rehab" versus pt being d/c and having to leave her home to get to orthopedic MD office CM reviewed with pt and daughter that the EDP would follow medicare guidelines and consulted MDs recommendations. Cm offered to contact [facility] on call staff to voice the interest of the pt and daughter CM informed pt and daughter that CM would have to review pt information with the on call MD and would ask the question if an orthopedic staff is able to come to see pt in [facility] ED. CM given permission to contact the on call ortho MD CM discussed there are criteria/guidelines for rehab admission and the pt has not been determined to be an inpatient initially to meet these guidelines.  Discussed that [facility] does not have an inpatient rehab unit. 1037 ED Cm called and spoke with Loni Dolly, on call staff for [facility] on [facility] (Dr Mayer Camel is a provider of [facility]) Reviewed pt with him and voiced pt and daughter request to have pt seen in [facility] ED vs having to leave home to go to Dr Mayer Camel office He agreed to address pt and daughter questions. Nida answered CM questions. Nida Discussed it would be best for pt to be seen in the office and get assist to office from [place] coordinated a call to [place] ([phone]) on  CM mobile phone while in room with pt, daughter & ED NP/PA Shawn.  Nida explained that Dr Mayer Camel is out of office for a funeral, there are guidelines to follow to set up knee surgery, there is no indication of knee fx and that for pt benefit it would be very important to follow the guidelines to make sure pt is medically clear to have knee surgery by labs, imaging, consulting other providers for pt like CV (PMH CAD, HTN MI) - Nida discussed generally take a week or more to schedule a surgery, possible facility placement but pt and daughter state pt would not be able to private pay for a facility and again states pt can not go to stay with  Abigail Butts Daughter discussed pt lives alone, will not be able to feed self at home and discussed wanting "rehab" and "surgery" for pt Daughter informed Nida he knew her, discussed her recovering from medical issues presently and ask if he could help her Nida to return call to daughter on her cell phone- call concluded [place], [place] encouraged pt/daughter to allow Cm to continue to set up home health as they wait for call back from [place] pt niece who could possibly assist with pt care (daughter states [facility] is "unreliable" "MIA") and Nida CM again reviewed PDN availability and SCAT  1127 ED Cm called and spoke with Manuela [facility] of [facility] to provided referral for [facility], PT, aide and SW Reviewed case concerns with Manuela Schwartz- wanting surgery, not wanting to stay with daughter Baseline walking with walker prior to this encounter

## 2016-08-23 NOTE — Progress Notes (Signed)
Fax confirmation received on SCAT application at [phone] Pt sbp in 200s CM notiffied ED RN and EDP PA

## 2016-08-23 NOTE — ED Notes (Signed)
MD at bedside. 

## 2016-08-24 ENCOUNTER — Telehealth: Payer: Self-pay | Admitting: *Deleted

## 2016-08-25 ENCOUNTER — Other Ambulatory Visit: Payer: Self-pay | Admitting: *Deleted

## 2016-08-25 DIAGNOSIS — F17201 Nicotine dependence, unspecified, in remission: Secondary | ICD-10-CM | POA: Diagnosis not present

## 2016-08-25 DIAGNOSIS — M1711 Unilateral primary osteoarthritis, right knee: Secondary | ICD-10-CM | POA: Diagnosis not present

## 2016-08-25 DIAGNOSIS — I2510 Atherosclerotic heart disease of native coronary artery without angina pectoris: Secondary | ICD-10-CM | POA: Diagnosis not present

## 2016-08-25 DIAGNOSIS — I10 Essential (primary) hypertension: Secondary | ICD-10-CM | POA: Diagnosis not present

## 2016-08-25 DIAGNOSIS — Z9181 History of falling: Secondary | ICD-10-CM | POA: Diagnosis not present

## 2016-08-25 DIAGNOSIS — Z7982 Long term (current) use of aspirin: Secondary | ICD-10-CM | POA: Diagnosis not present

## 2016-08-25 DIAGNOSIS — E785 Hyperlipidemia, unspecified: Secondary | ICD-10-CM | POA: Diagnosis not present

## 2016-08-25 DIAGNOSIS — J449 Chronic obstructive pulmonary disease, unspecified: Secondary | ICD-10-CM | POA: Diagnosis not present

## 2016-08-25 DIAGNOSIS — I252 Old myocardial infarction: Secondary | ICD-10-CM | POA: Diagnosis not present

## 2016-08-25 NOTE — Patient Outreach (Signed)
[facility]) Care Management  08/25/2016  ALFARETTA SCHOENECKER 1930-09-03 [id number]   Referral received from ED case manager, Learta Codding, for outreach due to recent fall.  Member is in need of knee replacement, which according to chart she has postponed for a while.  With her recent fall, she injured her right knee and she is now ready for surgery.  According to chart, she also has history of hypertension, coronary disease, and syncope.   Call placed to member, identity verified.  This care manager introduced self and purpose of call.  Member state that she has had home health begin visits, and report that she has had support of her god-daughter, Abigail Butts, and 2 of her nieces since she was discharged from the emergency department.  She state that her main concern at this time is her follow up appointment with Dr. Mayer Camel to schedule her surgery as she remains in pain.  She denies any other concerns, but does agree to a home visit/assessment.    Will follow up within 2 weeks with home visit, will address individualized care plan at that time as appointment with ortho surgeon will be complete.  Valente David, [place], MSN [place] [phone]

## 2016-08-29 DIAGNOSIS — M1711 Unilateral primary osteoarthritis, right knee: Secondary | ICD-10-CM | POA: Diagnosis not present

## 2016-08-30 DIAGNOSIS — J449 Chronic obstructive pulmonary disease, unspecified: Secondary | ICD-10-CM | POA: Diagnosis not present

## 2016-08-30 DIAGNOSIS — Z7982 Long term (current) use of aspirin: Secondary | ICD-10-CM | POA: Diagnosis not present

## 2016-08-30 DIAGNOSIS — I252 Old myocardial infarction: Secondary | ICD-10-CM | POA: Diagnosis not present

## 2016-08-30 DIAGNOSIS — I10 Essential (primary) hypertension: Secondary | ICD-10-CM | POA: Diagnosis not present

## 2016-08-30 DIAGNOSIS — F17201 Nicotine dependence, unspecified, in remission: Secondary | ICD-10-CM | POA: Diagnosis not present

## 2016-08-30 DIAGNOSIS — M1711 Unilateral primary osteoarthritis, right knee: Secondary | ICD-10-CM | POA: Diagnosis not present

## 2016-08-30 DIAGNOSIS — Z9181 History of falling: Secondary | ICD-10-CM | POA: Diagnosis not present

## 2016-08-30 DIAGNOSIS — I2510 Atherosclerotic heart disease of native coronary artery without angina pectoris: Secondary | ICD-10-CM | POA: Diagnosis not present

## 2016-08-30 DIAGNOSIS — E785 Hyperlipidemia, unspecified: Secondary | ICD-10-CM | POA: Diagnosis not present

## 2016-08-31 DIAGNOSIS — I252 Old myocardial infarction: Secondary | ICD-10-CM | POA: Diagnosis not present

## 2016-08-31 DIAGNOSIS — F17201 Nicotine dependence, unspecified, in remission: Secondary | ICD-10-CM | POA: Diagnosis not present

## 2016-08-31 DIAGNOSIS — I2510 Atherosclerotic heart disease of native coronary artery without angina pectoris: Secondary | ICD-10-CM | POA: Diagnosis not present

## 2016-08-31 DIAGNOSIS — E785 Hyperlipidemia, unspecified: Secondary | ICD-10-CM | POA: Diagnosis not present

## 2016-08-31 DIAGNOSIS — Z7982 Long term (current) use of aspirin: Secondary | ICD-10-CM | POA: Diagnosis not present

## 2016-08-31 DIAGNOSIS — J449 Chronic obstructive pulmonary disease, unspecified: Secondary | ICD-10-CM | POA: Diagnosis not present

## 2016-08-31 DIAGNOSIS — I10 Essential (primary) hypertension: Secondary | ICD-10-CM | POA: Diagnosis not present

## 2016-08-31 DIAGNOSIS — Z9181 History of falling: Secondary | ICD-10-CM | POA: Diagnosis not present

## 2016-08-31 DIAGNOSIS — M1711 Unilateral primary osteoarthritis, right knee: Secondary | ICD-10-CM | POA: Diagnosis not present

## 2016-09-04 ENCOUNTER — Encounter: Payer: Self-pay | Admitting: Cardiovascular Disease

## 2016-09-04 ENCOUNTER — Ambulatory Visit (INDEPENDENT_AMBULATORY_CARE_PROVIDER_SITE_OTHER): Payer: Commercial Managed Care - HMO | Admitting: Cardiovascular Disease

## 2016-09-04 VITALS — BP 180/100 | HR 70 | Ht 65.0 in

## 2016-09-04 DIAGNOSIS — I2510 Atherosclerotic heart disease of native coronary artery without angina pectoris: Secondary | ICD-10-CM

## 2016-09-04 DIAGNOSIS — R011 Cardiac murmur, unspecified: Secondary | ICD-10-CM

## 2016-09-04 DIAGNOSIS — I10 Essential (primary) hypertension: Secondary | ICD-10-CM

## 2016-09-04 DIAGNOSIS — Z01810 Encounter for preprocedural cardiovascular examination: Secondary | ICD-10-CM | POA: Diagnosis not present

## 2016-09-04 DIAGNOSIS — E785 Hyperlipidemia, unspecified: Secondary | ICD-10-CM

## 2016-09-04 NOTE — Progress Notes (Signed)
Chief Complaint  Patient presents with  . Leg Pain    History of Present Illness: 82 yo WF with history of HTN, hyperlipidemia and CAD here today for cardiac follow up. She had a NSTEMI and underwent cardiac cath on 09/07/10 and was found to have three vessel CAD. Drug eluting stents were placed in the RCA, LAD and Diagonal branch. She has done well since then. Syncopal event in December 2016. No forewarning. She had no palpitations. Event monitor with sinus, no arrhythmias. No recurrence of syncope.   She is here today for follow up and for pre-operative examination before planned right knee replacement. No date yet on this surgery. She has been doing well from a cardiac standpoint. She is limited over the last month due to knee pain. She had been very active up until then with no chest pain or dyspnea. No syncope, near syncope, orthopnea, PND.    Primary Care Physician: Vena Austria, MD  Past Medical History:  Diagnosis Date  . CAD (coronary artery disease)    s/p cath 09/07/10 w/3 vessel CAD, DES in RCA, Diagonal & LAD  . HTN (hypertension)   . Hyperlipemia   . MI (myocardial infarction) ([place])    Non-ST elevation 9/11    Past Surgical History:  Procedure Laterality Date  . PARTIAL HYSTERECTOMY      Current Outpatient Prescriptions  Medication Sig Dispense Refill  . amitriptyline (ELAVIL) 100 MG tablet Take 100 mg by mouth at bedtime.    Marland Kitchen amLODipine (NORVASC) 5 MG tablet TAKE 1 TABLET BY MOUTH EVERY DAY 30 tablet 6  . aspirin 81 MG tablet Take 81 mg by mouth every morning.     Marland Kitchen atorvastatin (LIPITOR) 80 MG tablet TAKE 1 TABLET BY MOUTH EVERY DAY (Patient taking differently: Take 80 mg by mouth every morning. ) 90 tablet 3  . BIOTIN 5000 PO Take 5,000 mg by mouth every morning.     . Calcium Carbonate-Vitamin D (OYSTER SHELL CALCIUM 500 + D PO) Take 1 capsule by mouth every morning.     . Cholecalciferol (VITAMIN D-3 PO) Take 1 tablet by mouth every morning.     .  fish oil-omega-3 fatty acids 1000 MG capsule Take 1 g by mouth 2 (two) times daily.      Marland Kitchen losartan-hydrochlorothiazide (HYZAAR) 100-25 MG per tablet Take 1 tablet by mouth every morning.     . metoprolol (LOPRESSOR) 50 MG tablet Take 50 mg by mouth 2 (two) times daily.     . Multiple Vitamins-Minerals (ICAPS MV PO) Take 1 capsule by mouth every morning.     . naproxen (NAPROSYN) 500 MG tablet Take 500 mg by mouth 2 (two) times daily as needed for mild pain or moderate pain.    . nitroGLYCERIN (NITROSTAT) 0.4 MG SL tablet Place 1 tablet (0.4 mg total) under the tongue every 5 (five) minutes as needed for chest pain. 25 tablet 6  . tizanidine (ZANAFLEX) 2 MG capsule Take 2 mg by mouth at bedtime.    . traMADol (ULTRAM) 50 MG tablet Take 1 tablet (50 mg total) by mouth every 12 (twelve) hours as needed. 10 tablet 0  . zolpidem (AMBIEN) 10 MG tablet Take 5 mg by mouth at bedtime as needed for sleep.      No current facility-administered medications for this visit.     Allergies  Allergen Reactions  . Codeine Other (See Comments)    Makes her severely depressed     Social History  Social History  . Marital status: Single    Spouse name: N/A  . Number of children: N/A  . Years of education: N/A   Occupational History  . Not on file.   Social History Main Topics  . Smoking status: Never Smoker  . Smokeless tobacco: Not on file  . Alcohol use No  . Drug use: Unknown  . Sexual activity: Not on file   Other Topics Concern  . Not on file   Social History Narrative  . No narrative on file    Family History  Problem Relation Age of Onset  . Heart attack Mother   . Coronary artery disease Brother   . Coronary artery disease Other     diagnosis of CAD but not premature diagnosis    Review of Systems:  As stated in the HPI and otherwise negative.   BP (!) 180/100 (BP Location: Right Arm, Patient Position: Sitting, Cuff Size: Normal)   Pulse 70   Ht 5\' 5"  (1.651 m)    Physical Examination: General: Well developed, well nourished, NAD  HEENT: OP clear, mucus membranes moist  SKIN: warm, dry. No rashes. Neuro: No focal deficits  Musculoskeletal: Muscle strength 5/5 all ext  Psychiatric: Mood and affect normal  Neck: No JVD, no carotid bruits, no thyromegaly, no lymphadenopathy.  Lungs:Clear bilaterally, no wheezes, rhonci, crackles Cardiovascular: Regular rate and rhythm. No murmurs, gallops or rubs. Abdomen:Soft. Bowel sounds present. Non-tender.  Extremities: No lower extremity edema. Pulses are 2 + in the bilateral DP/PT.  EKG:  EKG is ordered today. The ekg ordered today demonstrates NSR, rate 70 bpm. Incomplete RBBB  Recent Labs: No results found for requested labs within last 8760 hours.   Lipid Panel Followed in primary care   Wt Readings from Last 3 Encounters:  12/22/15 65.5 kg (144 lb 6.4 oz)  06/22/15 67.3 kg (148 lb 6.4 oz)  06/04/14 64.4 kg (142 lb)     Other studies Reviewed: Additional studies/ records that were reviewed today include: . Review of the above records demonstrates:    Assessment and Plan:   1. CAD: She has no chest pain concerning for angina. Her EKG shows no ischemic changes. Continue current meds including ASA, beta blocker and statin.  2. Pre-operative cardiovascular examination: She has no signs of heart failure, angina or arrhythmias. She had been very active until recently. She is now limited by knee pain. She does have a cardiac murmur on exam. I do not think ischemic testing is indicated prior to her surgery but I would like to arrange an echo to assess LV function and exclude severe valve disease.   3. HLD: Continue statin. Lipids followed in primary care  4. Systolic murmur: Will arrange echo now to assess.   5. HTN: BP is elevated today. She was upset due to being late today. Will not change anything today. No changes.   Current medicines are reviewed at length with the patient today.  The  patient does not have concerns regarding medicines.  The following changes have been made:  no change  Labs/ tests ordered today include:   Orders Placed This Encounter  Procedures  . EKG 12-Lead  . ECHOCARDIOGRAM COMPLETE    Disposition:   FU with me in 12  months  Signed, Lauree Chandler, MD 09/04/2016 4:50 PM    [facility] [place], [place], [place]  [address] Phone: [phone]; Fax: [phone]

## 2016-09-04 NOTE — Patient Instructions (Signed)
Medication Instructions:  Your physician recommends that you continue on your current medications as directed. Please refer to the Current Medication list given to you today.   Labwork: none  Testing/Procedures: Your physician has requested that you have an echocardiogram. Echocardiography is a painless test that uses sound waves to create images of your heart. It provides your doctor with information about the size and shape of your heart and how well your heart's chambers and valves are working. This procedure takes approximately one hour. There are no restrictions for this procedure.  Scheduled for October 2,2017    Follow-Up: Your physician wants you to follow-up in: 12 months.  You will receive a reminder letter in the mail two months in advance. If you don't receive a letter, please call our office to schedule the follow-up appointment.   Any Other Special Instructions Will Be Listed Below (If Applicable).     If you need a refill on your cardiac medications before your next appointment, please call your pharmacy.

## 2016-09-05 DIAGNOSIS — M1711 Unilateral primary osteoarthritis, right knee: Secondary | ICD-10-CM | POA: Diagnosis not present

## 2016-09-05 DIAGNOSIS — F17201 Nicotine dependence, unspecified, in remission: Secondary | ICD-10-CM | POA: Diagnosis not present

## 2016-09-05 DIAGNOSIS — J449 Chronic obstructive pulmonary disease, unspecified: Secondary | ICD-10-CM | POA: Diagnosis not present

## 2016-09-05 DIAGNOSIS — E785 Hyperlipidemia, unspecified: Secondary | ICD-10-CM | POA: Diagnosis not present

## 2016-09-05 DIAGNOSIS — I10 Essential (primary) hypertension: Secondary | ICD-10-CM | POA: Diagnosis not present

## 2016-09-05 DIAGNOSIS — I2510 Atherosclerotic heart disease of native coronary artery without angina pectoris: Secondary | ICD-10-CM | POA: Diagnosis not present

## 2016-09-05 DIAGNOSIS — Z7982 Long term (current) use of aspirin: Secondary | ICD-10-CM | POA: Diagnosis not present

## 2016-09-05 DIAGNOSIS — Z9181 History of falling: Secondary | ICD-10-CM | POA: Diagnosis not present

## 2016-09-05 DIAGNOSIS — I252 Old myocardial infarction: Secondary | ICD-10-CM | POA: Diagnosis not present

## 2016-09-06 ENCOUNTER — Other Ambulatory Visit: Payer: Self-pay | Admitting: *Deleted

## 2016-09-06 ENCOUNTER — Encounter: Payer: Self-pay | Admitting: *Deleted

## 2016-09-06 NOTE — Patient Outreach (Signed)
[facility]) Care Management  09/06/2016  Rebecca Ford 01-27-1930 [id number]   Met with member at scheduled time, caregiver, Helene Kelp, present.  [facility] care management services again explained.  Member state that other than having surgery scheduled for knee repair, she has no needs at this time.  She state that she manages her care well, and now have 24hr care in her home as she awaits surgery.  She denies questions/concerns regarding medication management or community resource/social work related needs.  She and Helene Kelp do both agree that she will need the assistance after her surgery.  Decision made not to start involvement today, but to wait until closer to/after surgery.  Welcome packet and [facility] calendar (with community resources listed) provided.  Member/caregive will contact [facility] when ready for involvement.  Will close case at this time.  Will notify care management assistant and PCP.  Valente David, [place], MSN [place] [phone]

## 2016-09-08 ENCOUNTER — Ambulatory Visit (HOSPITAL_COMMUNITY): Payer: Commercial Managed Care - HMO | Attending: Cardiology

## 2016-09-08 ENCOUNTER — Encounter: Payer: Self-pay | Admitting: Cardiovascular Disease

## 2016-09-08 ENCOUNTER — Other Ambulatory Visit: Payer: Self-pay

## 2016-09-08 DIAGNOSIS — I119 Hypertensive heart disease without heart failure: Secondary | ICD-10-CM | POA: Insufficient documentation

## 2016-09-08 DIAGNOSIS — M1711 Unilateral primary osteoarthritis, right knee: Secondary | ICD-10-CM | POA: Diagnosis not present

## 2016-09-08 DIAGNOSIS — I252 Old myocardial infarction: Secondary | ICD-10-CM | POA: Insufficient documentation

## 2016-09-08 DIAGNOSIS — I059 Rheumatic mitral valve disease, unspecified: Secondary | ICD-10-CM | POA: Diagnosis not present

## 2016-09-08 DIAGNOSIS — I071 Rheumatic tricuspid insufficiency: Secondary | ICD-10-CM | POA: Insufficient documentation

## 2016-09-08 DIAGNOSIS — Z8249 Family history of ischemic heart disease and other diseases of the circulatory system: Secondary | ICD-10-CM | POA: Diagnosis not present

## 2016-09-08 DIAGNOSIS — F17201 Nicotine dependence, unspecified, in remission: Secondary | ICD-10-CM | POA: Diagnosis not present

## 2016-09-08 DIAGNOSIS — E785 Hyperlipidemia, unspecified: Secondary | ICD-10-CM | POA: Diagnosis not present

## 2016-09-08 DIAGNOSIS — I340 Nonrheumatic mitral (valve) insufficiency: Secondary | ICD-10-CM | POA: Diagnosis not present

## 2016-09-08 DIAGNOSIS — R011 Cardiac murmur, unspecified: Secondary | ICD-10-CM

## 2016-09-08 DIAGNOSIS — I2510 Atherosclerotic heart disease of native coronary artery without angina pectoris: Secondary | ICD-10-CM | POA: Insufficient documentation

## 2016-09-08 DIAGNOSIS — Z9181 History of falling: Secondary | ICD-10-CM | POA: Diagnosis not present

## 2016-09-08 DIAGNOSIS — I10 Essential (primary) hypertension: Secondary | ICD-10-CM | POA: Diagnosis not present

## 2016-09-08 DIAGNOSIS — J449 Chronic obstructive pulmonary disease, unspecified: Secondary | ICD-10-CM | POA: Diagnosis not present

## 2016-09-08 DIAGNOSIS — Z7982 Long term (current) use of aspirin: Secondary | ICD-10-CM | POA: Diagnosis not present

## 2016-09-10 DIAGNOSIS — M1711 Unilateral primary osteoarthritis, right knee: Secondary | ICD-10-CM

## 2016-09-10 HISTORY — DX: Unilateral primary osteoarthritis, right knee: M17.11

## 2016-09-11 ENCOUNTER — Other Ambulatory Visit (HOSPITAL_COMMUNITY): Payer: Commercial Managed Care - HMO

## 2016-09-11 DIAGNOSIS — I2510 Atherosclerotic heart disease of native coronary artery without angina pectoris: Secondary | ICD-10-CM | POA: Diagnosis not present

## 2016-09-11 DIAGNOSIS — E785 Hyperlipidemia, unspecified: Secondary | ICD-10-CM | POA: Diagnosis not present

## 2016-09-11 DIAGNOSIS — Z7982 Long term (current) use of aspirin: Secondary | ICD-10-CM | POA: Diagnosis not present

## 2016-09-11 DIAGNOSIS — J449 Chronic obstructive pulmonary disease, unspecified: Secondary | ICD-10-CM | POA: Diagnosis not present

## 2016-09-11 DIAGNOSIS — I10 Essential (primary) hypertension: Secondary | ICD-10-CM | POA: Diagnosis not present

## 2016-09-11 DIAGNOSIS — M1711 Unilateral primary osteoarthritis, right knee: Secondary | ICD-10-CM | POA: Diagnosis not present

## 2016-09-11 DIAGNOSIS — I252 Old myocardial infarction: Secondary | ICD-10-CM | POA: Diagnosis not present

## 2016-09-11 DIAGNOSIS — Z9181 History of falling: Secondary | ICD-10-CM | POA: Diagnosis not present

## 2016-09-11 DIAGNOSIS — F17201 Nicotine dependence, unspecified, in remission: Secondary | ICD-10-CM | POA: Diagnosis not present

## 2016-09-12 DIAGNOSIS — E785 Hyperlipidemia, unspecified: Secondary | ICD-10-CM | POA: Diagnosis not present

## 2016-09-12 DIAGNOSIS — Z7982 Long term (current) use of aspirin: Secondary | ICD-10-CM | POA: Diagnosis not present

## 2016-09-12 DIAGNOSIS — F17201 Nicotine dependence, unspecified, in remission: Secondary | ICD-10-CM | POA: Diagnosis not present

## 2016-09-12 DIAGNOSIS — J449 Chronic obstructive pulmonary disease, unspecified: Secondary | ICD-10-CM | POA: Diagnosis not present

## 2016-09-12 DIAGNOSIS — Z9181 History of falling: Secondary | ICD-10-CM | POA: Diagnosis not present

## 2016-09-12 DIAGNOSIS — I252 Old myocardial infarction: Secondary | ICD-10-CM | POA: Diagnosis not present

## 2016-09-12 DIAGNOSIS — I2510 Atherosclerotic heart disease of native coronary artery without angina pectoris: Secondary | ICD-10-CM | POA: Diagnosis not present

## 2016-09-12 DIAGNOSIS — M1711 Unilateral primary osteoarthritis, right knee: Secondary | ICD-10-CM | POA: Diagnosis not present

## 2016-09-12 DIAGNOSIS — I10 Essential (primary) hypertension: Secondary | ICD-10-CM | POA: Diagnosis not present

## 2016-09-14 DIAGNOSIS — J449 Chronic obstructive pulmonary disease, unspecified: Secondary | ICD-10-CM | POA: Diagnosis not present

## 2016-09-14 DIAGNOSIS — I10 Essential (primary) hypertension: Secondary | ICD-10-CM | POA: Diagnosis not present

## 2016-09-14 DIAGNOSIS — F17201 Nicotine dependence, unspecified, in remission: Secondary | ICD-10-CM | POA: Diagnosis not present

## 2016-09-14 DIAGNOSIS — I2510 Atherosclerotic heart disease of native coronary artery without angina pectoris: Secondary | ICD-10-CM | POA: Diagnosis not present

## 2016-09-14 DIAGNOSIS — I252 Old myocardial infarction: Secondary | ICD-10-CM | POA: Diagnosis not present

## 2016-09-14 DIAGNOSIS — M1711 Unilateral primary osteoarthritis, right knee: Secondary | ICD-10-CM | POA: Diagnosis not present

## 2016-09-14 DIAGNOSIS — Z9181 History of falling: Secondary | ICD-10-CM | POA: Diagnosis not present

## 2016-09-14 DIAGNOSIS — Z7982 Long term (current) use of aspirin: Secondary | ICD-10-CM | POA: Diagnosis not present

## 2016-09-14 DIAGNOSIS — E785 Hyperlipidemia, unspecified: Secondary | ICD-10-CM | POA: Diagnosis not present

## 2016-09-18 DIAGNOSIS — I10 Essential (primary) hypertension: Secondary | ICD-10-CM | POA: Diagnosis not present

## 2016-09-18 DIAGNOSIS — Z7982 Long term (current) use of aspirin: Secondary | ICD-10-CM | POA: Diagnosis not present

## 2016-09-18 DIAGNOSIS — R609 Edema, unspecified: Secondary | ICD-10-CM | POA: Diagnosis not present

## 2016-09-18 DIAGNOSIS — M1711 Unilateral primary osteoarthritis, right knee: Secondary | ICD-10-CM | POA: Diagnosis not present

## 2016-09-18 DIAGNOSIS — E785 Hyperlipidemia, unspecified: Secondary | ICD-10-CM | POA: Diagnosis not present

## 2016-09-18 DIAGNOSIS — D485 Neoplasm of uncertain behavior of skin: Secondary | ICD-10-CM | POA: Diagnosis not present

## 2016-09-18 DIAGNOSIS — I2510 Atherosclerotic heart disease of native coronary artery without angina pectoris: Secondary | ICD-10-CM | POA: Diagnosis not present

## 2016-09-18 DIAGNOSIS — J449 Chronic obstructive pulmonary disease, unspecified: Secondary | ICD-10-CM | POA: Diagnosis not present

## 2016-09-18 DIAGNOSIS — I252 Old myocardial infarction: Secondary | ICD-10-CM | POA: Diagnosis not present

## 2016-09-18 DIAGNOSIS — Z9181 History of falling: Secondary | ICD-10-CM | POA: Diagnosis not present

## 2016-09-18 DIAGNOSIS — Z23 Encounter for immunization: Secondary | ICD-10-CM | POA: Diagnosis not present

## 2016-09-18 DIAGNOSIS — F17201 Nicotine dependence, unspecified, in remission: Secondary | ICD-10-CM | POA: Diagnosis not present

## 2016-09-19 DIAGNOSIS — M1711 Unilateral primary osteoarthritis, right knee: Secondary | ICD-10-CM | POA: Diagnosis not present

## 2016-09-19 DIAGNOSIS — J449 Chronic obstructive pulmonary disease, unspecified: Secondary | ICD-10-CM | POA: Diagnosis not present

## 2016-09-19 DIAGNOSIS — I10 Essential (primary) hypertension: Secondary | ICD-10-CM | POA: Diagnosis not present

## 2016-09-19 DIAGNOSIS — I252 Old myocardial infarction: Secondary | ICD-10-CM | POA: Diagnosis not present

## 2016-09-19 DIAGNOSIS — I2510 Atherosclerotic heart disease of native coronary artery without angina pectoris: Secondary | ICD-10-CM | POA: Diagnosis not present

## 2016-09-19 DIAGNOSIS — E785 Hyperlipidemia, unspecified: Secondary | ICD-10-CM | POA: Diagnosis not present

## 2016-09-19 DIAGNOSIS — Z7982 Long term (current) use of aspirin: Secondary | ICD-10-CM | POA: Diagnosis not present

## 2016-09-19 DIAGNOSIS — F17201 Nicotine dependence, unspecified, in remission: Secondary | ICD-10-CM | POA: Diagnosis not present

## 2016-09-19 DIAGNOSIS — Z9181 History of falling: Secondary | ICD-10-CM | POA: Diagnosis not present

## 2016-09-21 DIAGNOSIS — I2510 Atherosclerotic heart disease of native coronary artery without angina pectoris: Secondary | ICD-10-CM | POA: Diagnosis not present

## 2016-09-21 DIAGNOSIS — M2351 Chronic instability of knee, right knee: Secondary | ICD-10-CM | POA: Diagnosis not present

## 2016-09-21 DIAGNOSIS — I10 Essential (primary) hypertension: Secondary | ICD-10-CM | POA: Diagnosis not present

## 2016-09-21 DIAGNOSIS — M1711 Unilateral primary osteoarthritis, right knee: Secondary | ICD-10-CM | POA: Diagnosis not present

## 2016-09-21 DIAGNOSIS — E785 Hyperlipidemia, unspecified: Secondary | ICD-10-CM | POA: Diagnosis not present

## 2016-09-21 DIAGNOSIS — Z7982 Long term (current) use of aspirin: Secondary | ICD-10-CM | POA: Diagnosis not present

## 2016-09-21 DIAGNOSIS — F17201 Nicotine dependence, unspecified, in remission: Secondary | ICD-10-CM | POA: Diagnosis not present

## 2016-09-21 DIAGNOSIS — Z9181 History of falling: Secondary | ICD-10-CM | POA: Diagnosis not present

## 2016-09-21 DIAGNOSIS — J449 Chronic obstructive pulmonary disease, unspecified: Secondary | ICD-10-CM | POA: Diagnosis not present

## 2016-09-21 DIAGNOSIS — I252 Old myocardial infarction: Secondary | ICD-10-CM | POA: Diagnosis not present

## 2016-09-25 DIAGNOSIS — I2510 Atherosclerotic heart disease of native coronary artery without angina pectoris: Secondary | ICD-10-CM | POA: Diagnosis not present

## 2016-09-25 DIAGNOSIS — I10 Essential (primary) hypertension: Secondary | ICD-10-CM | POA: Diagnosis not present

## 2016-09-25 DIAGNOSIS — Z7982 Long term (current) use of aspirin: Secondary | ICD-10-CM | POA: Diagnosis not present

## 2016-09-25 DIAGNOSIS — M1711 Unilateral primary osteoarthritis, right knee: Secondary | ICD-10-CM | POA: Diagnosis not present

## 2016-09-25 DIAGNOSIS — Z9181 History of falling: Secondary | ICD-10-CM | POA: Diagnosis not present

## 2016-09-25 DIAGNOSIS — E785 Hyperlipidemia, unspecified: Secondary | ICD-10-CM | POA: Diagnosis not present

## 2016-09-25 DIAGNOSIS — F17201 Nicotine dependence, unspecified, in remission: Secondary | ICD-10-CM | POA: Diagnosis not present

## 2016-09-25 DIAGNOSIS — I252 Old myocardial infarction: Secondary | ICD-10-CM | POA: Diagnosis not present

## 2016-09-25 DIAGNOSIS — J449 Chronic obstructive pulmonary disease, unspecified: Secondary | ICD-10-CM | POA: Diagnosis not present

## 2016-09-26 DIAGNOSIS — M1711 Unilateral primary osteoarthritis, right knee: Secondary | ICD-10-CM | POA: Diagnosis not present

## 2016-09-26 DIAGNOSIS — I252 Old myocardial infarction: Secondary | ICD-10-CM | POA: Diagnosis not present

## 2016-09-26 DIAGNOSIS — F17201 Nicotine dependence, unspecified, in remission: Secondary | ICD-10-CM | POA: Diagnosis not present

## 2016-09-26 DIAGNOSIS — Z9181 History of falling: Secondary | ICD-10-CM | POA: Diagnosis not present

## 2016-09-26 DIAGNOSIS — I10 Essential (primary) hypertension: Secondary | ICD-10-CM | POA: Diagnosis not present

## 2016-09-26 DIAGNOSIS — Z7982 Long term (current) use of aspirin: Secondary | ICD-10-CM | POA: Diagnosis not present

## 2016-09-26 DIAGNOSIS — E785 Hyperlipidemia, unspecified: Secondary | ICD-10-CM | POA: Diagnosis not present

## 2016-09-26 DIAGNOSIS — I2510 Atherosclerotic heart disease of native coronary artery without angina pectoris: Secondary | ICD-10-CM | POA: Diagnosis not present

## 2016-09-26 DIAGNOSIS — J449 Chronic obstructive pulmonary disease, unspecified: Secondary | ICD-10-CM | POA: Diagnosis not present

## 2016-09-28 DIAGNOSIS — M1711 Unilateral primary osteoarthritis, right knee: Secondary | ICD-10-CM | POA: Diagnosis not present

## 2016-09-28 DIAGNOSIS — Z7982 Long term (current) use of aspirin: Secondary | ICD-10-CM | POA: Diagnosis not present

## 2016-09-28 DIAGNOSIS — E785 Hyperlipidemia, unspecified: Secondary | ICD-10-CM | POA: Diagnosis not present

## 2016-09-28 DIAGNOSIS — I2510 Atherosclerotic heart disease of native coronary artery without angina pectoris: Secondary | ICD-10-CM | POA: Diagnosis not present

## 2016-09-28 DIAGNOSIS — F17201 Nicotine dependence, unspecified, in remission: Secondary | ICD-10-CM | POA: Diagnosis not present

## 2016-09-28 DIAGNOSIS — J449 Chronic obstructive pulmonary disease, unspecified: Secondary | ICD-10-CM | POA: Diagnosis not present

## 2016-09-28 DIAGNOSIS — I252 Old myocardial infarction: Secondary | ICD-10-CM | POA: Diagnosis not present

## 2016-09-28 DIAGNOSIS — I10 Essential (primary) hypertension: Secondary | ICD-10-CM | POA: Diagnosis not present

## 2016-09-28 DIAGNOSIS — Z9181 History of falling: Secondary | ICD-10-CM | POA: Diagnosis not present

## 2016-10-03 DIAGNOSIS — I10 Essential (primary) hypertension: Secondary | ICD-10-CM | POA: Diagnosis not present

## 2016-10-03 DIAGNOSIS — I252 Old myocardial infarction: Secondary | ICD-10-CM | POA: Diagnosis not present

## 2016-10-03 DIAGNOSIS — M1711 Unilateral primary osteoarthritis, right knee: Secondary | ICD-10-CM | POA: Diagnosis not present

## 2016-10-03 DIAGNOSIS — F17201 Nicotine dependence, unspecified, in remission: Secondary | ICD-10-CM | POA: Diagnosis not present

## 2016-10-03 DIAGNOSIS — I2510 Atherosclerotic heart disease of native coronary artery without angina pectoris: Secondary | ICD-10-CM | POA: Diagnosis not present

## 2016-10-03 DIAGNOSIS — Z7982 Long term (current) use of aspirin: Secondary | ICD-10-CM | POA: Diagnosis not present

## 2016-10-03 DIAGNOSIS — Z9181 History of falling: Secondary | ICD-10-CM | POA: Diagnosis not present

## 2016-10-03 DIAGNOSIS — J449 Chronic obstructive pulmonary disease, unspecified: Secondary | ICD-10-CM | POA: Diagnosis not present

## 2016-10-03 DIAGNOSIS — E785 Hyperlipidemia, unspecified: Secondary | ICD-10-CM | POA: Diagnosis not present

## 2016-10-25 ENCOUNTER — Other Ambulatory Visit: Payer: Self-pay | Admitting: Orthopedic Surgery

## 2016-10-31 ENCOUNTER — Ambulatory Visit (HOSPITAL_COMMUNITY)
Admission: RE | Admit: 2016-10-31 | Discharge: 2016-10-31 | Disposition: A | Discharge status: Home / Self Care | Payer: Commercial Managed Care - HMO | Source: Ambulatory Visit | Attending: Orthopedic Surgery | Admitting: Orthopedic Surgery

## 2016-10-31 ENCOUNTER — Encounter (HOSPITAL_COMMUNITY)
Admission: RE | Admit: 2016-10-31 | Discharge: 2016-10-31 | Disposition: A | Discharge status: Home / Self Care | Payer: Commercial Managed Care - HMO | Source: Ambulatory Visit | Attending: Orthopedic Surgery | Admitting: Orthopedic Surgery

## 2016-10-31 ENCOUNTER — Encounter (HOSPITAL_COMMUNITY): Payer: Self-pay | Admitting: *Deleted

## 2016-10-31 DIAGNOSIS — E785 Hyperlipidemia, unspecified: Secondary | ICD-10-CM | POA: Insufficient documentation

## 2016-10-31 DIAGNOSIS — E876 Hypokalemia: Secondary | ICD-10-CM | POA: Insufficient documentation

## 2016-10-31 DIAGNOSIS — M1711 Unilateral primary osteoarthritis, right knee: Secondary | ICD-10-CM | POA: Insufficient documentation

## 2016-10-31 DIAGNOSIS — Z955 Presence of coronary angioplasty implant and graft: Secondary | ICD-10-CM | POA: Diagnosis not present

## 2016-10-31 DIAGNOSIS — Z79899 Other long term (current) drug therapy: Secondary | ICD-10-CM | POA: Diagnosis not present

## 2016-10-31 DIAGNOSIS — Z01812 Encounter for preprocedural laboratory examination: Secondary | ICD-10-CM | POA: Insufficient documentation

## 2016-10-31 DIAGNOSIS — Z01818 Encounter for other preprocedural examination: Secondary | ICD-10-CM | POA: Insufficient documentation

## 2016-10-31 DIAGNOSIS — I2510 Atherosclerotic heart disease of native coronary artery without angina pectoris: Secondary | ICD-10-CM | POA: Insufficient documentation

## 2016-10-31 DIAGNOSIS — F0390 Unspecified dementia without behavioral disturbance: Secondary | ICD-10-CM | POA: Diagnosis not present

## 2016-10-31 DIAGNOSIS — Z8679 Personal history of other diseases of the circulatory system: Secondary | ICD-10-CM | POA: Diagnosis not present

## 2016-10-31 DIAGNOSIS — I10 Essential (primary) hypertension: Secondary | ICD-10-CM | POA: Insufficient documentation

## 2016-10-31 HISTORY — DX: Unspecified dementia, unspecified severity, without behavioral disturbance, psychotic disturbance, mood disturbance, and anxiety: F03.90

## 2016-10-31 HISTORY — DX: Unspecified osteoarthritis, unspecified site: M19.90

## 2016-10-31 LAB — CBC WITH DIFFERENTIAL/PLATELET
BASOS ABS: 0 10*3/uL (ref 0.0–0.1)
Basophils Relative: 0 %
EOS PCT: 3 %
Eosinophils Absolute: 0.2 10*3/uL (ref 0.0–0.7)
HCT: 36.1 % (ref 36.0–46.0)
HEMOGLOBIN: 12.5 g/dL (ref 12.0–15.0)
LYMPHS ABS: 1.3 10*3/uL (ref 0.7–4.0)
LYMPHS PCT: 19 %
MCH: 32.5 pg (ref 26.0–34.0)
MCHC: 34.6 g/dL (ref 30.0–36.0)
MCV: 93.8 fL (ref 78.0–100.0)
Monocytes Absolute: 0.5 10*3/uL (ref 0.1–1.0)
Monocytes Relative: 7 %
NEUTROS PCT: 71 %
Neutro Abs: 4.9 10*3/uL (ref 1.7–7.7)
PLATELETS: 184 10*3/uL (ref 150–400)
RBC: 3.85 MIL/uL — AB (ref 3.87–5.11)
RDW: 12.2 % (ref 11.5–15.5)
WBC: 6.9 10*3/uL (ref 4.0–10.5)

## 2016-10-31 LAB — BASIC METABOLIC PANEL
ANION GAP: 8 (ref 5–15)
BUN: 16 mg/dL (ref 6–20)
CHLORIDE: 99 mmol/L — AB (ref 101–111)
CO2: 28 mmol/L (ref 22–32)
Calcium: 9.4 mg/dL (ref 8.9–10.3)
Creatinine, Ser: 0.97 mg/dL (ref 0.44–1.00)
GFR, EST NON AFRICAN AMERICAN: 52 mL/min — AB (ref >60)
Glucose, Bld: 89 mg/dL (ref 65–99)
POTASSIUM: 2.9 mmol/L — AB (ref 3.5–5.1)
SODIUM: 135 mmol/L (ref 135–145)

## 2016-10-31 LAB — SURGICAL PCR SCREEN
MRSA, PCR: NEGATIVE
STAPHYLOCOCCUS AUREUS: POSITIVE — AB

## 2016-10-31 LAB — PROTIME-INR
INR: 1.13
PROTHROMBIN TIME: 14.5 s (ref 11.4–15.2)

## 2016-10-31 LAB — APTT: APTT: 29 s (ref 24–36)

## 2016-10-31 NOTE — Pre-Procedure Instructions (Signed)
Rebecca Ford  10/31/2016      [id number] [place], [place] - [address] [place] [address] Phone: [phone] Fax: [phone]  [place], [place] [address] Phone: [phone] Fax: [phone]    Your procedure is scheduled on   Monday  11/13/16  Report to [place] at 730 A.M.  Call this number if you have problems the morning of surgery:  [phone]   Remember:  Do not eat food or drink liquids after midnight.  Take these medicines the morning of surgery with A SIP OF WATER   AMLODIPINE (NORVASC), METOPROLOL(LOPRESSOR), TRAMADOL IF NEEDED         (STOP 7 DAYS PRIOR TO SURGERY- ASPIRIN OR ASPIRIN PRODUCTS, IBUPROFEN/ ADVIL/ MOTRIN/ ALEVE, GOODY POWDERS, BC'S, HERBAL MEDICINES, FISH OIL, MULTIVITAMIN, NAPROXEN/ NAPROSYN)   Do not wear jewelry, make-up or nail polish.  Do not wear lotions, powders, or perfumes, or deoderant.  Do not shave 48 hours prior to surgery.  Men may shave face and neck.  Do not bring valuables to the hospital.  [facility] is not responsible for any belongings or valuables.  Contacts, dentures or bridgework may not be worn into surgery.  Leave your suitcase in the car.  After surgery it may be brought to your room.  For patients admitted to the hospital, discharge time will be determined by your treatment team.  Patients discharged the day of surgery will not be allowed to drive home.   Name and phone number of your driver:    Special instructions:  [place] - Preparing for Surgery  Before surgery, you can play an important role.  Because skin is not sterile, your skin needs to be as free of germs as possible.  You can reduce the number of germs on you skin by washing with CHG (chlorahexidine gluconate) soap before surgery.  CHG is an antiseptic cleaner which kills germs and bonds with the skin to continue  killing germs even after washing.  Please DO NOT use if you have an allergy to CHG or antibacterial soaps.  If your skin becomes reddened/irritated stop using the CHG and inform your nurse when you arrive at Short Stay.  Do not shave (including legs and underarms) for at least 48 hours prior to the first CHG shower.  You may shave your face.  Please follow these instructions carefully:   1.  Shower with CHG Soap the night before surgery and the                                morning of Surgery.  2.  If you choose to wash your hair, wash your hair first as usual with your       normal shampoo.  3.  After you shampoo, rinse your hair and body thoroughly to remove the                      Shampoo.  4.  Use CHG as you would any other liquid soap.  You can apply chg directly       to the skin and wash gently with scrungie or a clean washcloth.  5.  Apply the CHG Soap to your body ONLY FROM THE NECK DOWN.        Do not use on  open wounds or open sores.  Avoid contact with your eyes,       ears, mouth and genitals (private parts).  Wash genitals (private parts)       with your normal soap.  6.  Wash thoroughly, paying special attention to the area where your surgery        will be performed.  7.  Thoroughly rinse your body with warm water from the neck down.  8.  DO NOT shower/wash with your normal soap after using and rinsing off       the CHG Soap.  9.  Pat yourself dry with a clean towel.            10.  Wear clean pajamas.            11.  Place clean sheets on your bed the night of your first shower and do not        sleep with pets.  Day of Surgery  Do not apply any lotions/deoderants the morning of surgery.  Please wear clean clothes to the hospital/surgery center.    Please read over the following fact sheets that you were given. MRSA Information and Surgical Site Infection Prevention

## 2016-10-31 NOTE — Progress Notes (Signed)
I called a prescription for Mupirocin ointment to [facility] , [address] [place], [place]

## 2016-11-01 NOTE — Progress Notes (Addendum)
Anesthesia Chart Review:  [name] an 82 year old female scheduled for R total knee arthroplasty on 11/13/2016 with Frederik Pear, MD.   - PCP is [name], MD.  - Cardiologist is Lauree Chandler, MD, last office visit 09/04/16, who has cleared pt for surgery.   PMH includes:  CAD (DES to RCA, diagonal and LAD 09/07/10), HTN, hyperlipidemia, dementia. Never smoker. BMI 26  Medications include: lipitor, lasix, losartan-hctz, metoprolol  Preoperative labs reviewed.  K 2.9. [name] in Dr. Damita Dunnings office spoke with pt's daughter about hypokalemia.  Pt/daughter to contact PCP for f/u and treatment.  Will recheck K DOS.   CXR 10/31/16: No active cardiopulmonary disease.  EKG 09/04/16: NSR. LAD. Incomplete RBBB.   Echo 09/08/16:  - Left ventricle: The cavity size was normal. Wall thickness was increased in a pattern of moderate LVH. Systolic function was normal. The estimated ejection fraction was in the range of 50% to 55%. Wall motion was normal; there were no regional wall motion abnormalities. Doppler parameters are consistent with abnormal left ventricular relaxation (grade 1 diastolic dysfunction). - Mitral valve: Calcified annulus. Mildly thickened leaflets . - Left atrium: The atrium was mildly dilated.  Cardiac cath 09/07/10:  - LM: Normal - LAD: 20% ostial stenosis. 80% mid stenosis. First diagonal 99% stenosis.  - CX: Ostial 30% stenosis. OM1 plaque disease. OM2 40% stenosis. - RCA: Large dominant vessel. Mid 99% stenosis.  - DES to LAD, diagonal 1, and RCA.   If K acceptable DOS, I anticipate pt can proceed as scheduled.   Willeen Cass, FNP-BC [facility] Short Stay Surgical [facility]/Anesthesiology Phone: [phone] 11/01/2016 1:33 PM

## 2016-11-07 DIAGNOSIS — J069 Acute upper respiratory infection, unspecified: Secondary | ICD-10-CM | POA: Diagnosis not present

## 2016-11-08 DIAGNOSIS — M1711 Unilateral primary osteoarthritis, right knee: Secondary | ICD-10-CM | POA: Diagnosis present

## 2016-11-10 MED ORDER — BUPIVACAINE LIPOSOME 1.3 % IJ SUSP
20.0000 mL | Freq: Once | INTRAMUSCULAR | Status: DC
  Filled 2016-11-10: qty 20

## 2016-11-10 MED ORDER — TRANEXAMIC ACID 1000 MG/10ML IV SOLN
1000.0000 mg | INTRAVENOUS | Status: AC
  Administered 2016-11-13: 1000 mg via INTRAVENOUS
  Filled 2016-11-10: qty 10

## 2016-11-10 MED ORDER — TRANEXAMIC ACID 1000 MG/10ML IV SOLN
2000.0000 mg | Freq: Once | INTRAVENOUS | Status: DC
  Filled 2016-11-10: qty 20

## 2016-11-10 NOTE — H&P (Signed)
TOTAL KNEE ADMISSION H&P  Patient is being admitted for right total knee arthroplasty.  Subjective:  Chief Complaint:right knee pain.  HPI: Rebecca Ford, 82 y.o. female, has a history of pain and functional disability in the right knee due to arthritis and has failed non-surgical conservative treatments for greater than 12 weeks to includeNSAID's and/or analgesics, flexibility and strengthening excercises, use of assistive devices and activity modification.  Onset of symptoms was gradual, starting 3 years ago with gradually worsening course since that time. The patient noted no past surgery on the right knee(s).  Patient currently rates pain in the right knee(s) at 10 out of 10 with activity. Patient has night pain, worsening of pain with activity and weight bearing, pain that interferes with activities of daily living and pain with passive range of motion.  Patient has evidence of subchondral sclerosis and joint space narrowing by imaging studies.   There is no active infection.  Patient Active Problem List   Diagnosis Date Noted  . Primary osteoarthritis of right knee [id number]  . Syncope 11/16/2015  . 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 HYPERTENSION, BENIGN 09/20/2010  . CAD, NATIVE VESSEL 09/20/2010   Past Medical History:  Diagnosis Date  . Arthritis   . CAD (coronary artery disease)    s/p cath 09/07/10 w/3 vessel CAD, DES in RCA, Diagonal & LAD  . Dementia   . HTN (hypertension)   . Hyperlipemia   . MI (myocardial infarction)    Non-ST elevation 9/11    Past Surgical History:  Procedure Laterality Date  . CARDIAC CATHETERIZATION     YRS AGO   . CATARACT EXTRACTION W/ INTRAOCULAR LENS  IMPLANT, BILATERAL    . PARTIAL HYSTERECTOMY    . RETINAL DETACHMENT SURGERY     LEFT    No prescriptions prior to admission.   Allergies  Allergen Reactions  . Codeine Other (See Comments)    Makes her severely depressed     Social History  Substance Use Topics  . Smoking  status: Never Smoker  . Smokeless tobacco: Not on file  . Alcohol use No    Family History  Problem Relation Age of Onset  . Heart attack Mother   . Coronary artery disease Other     diagnosis of CAD but not premature diagnosis  . Coronary artery disease Brother      Review of Systems  Constitutional: Positive for malaise/fatigue.  HENT: Negative.   Eyes: Negative.   Respiratory: Negative.   Cardiovascular:       Heart attack, HTN  Gastrointestinal: Positive for constipation.  Genitourinary:       Poor bladder control  Musculoskeletal: Positive for joint pain and myalgias.  Skin: Negative.   Neurological: Positive for dizziness.  Endo/Heme/Allergies: Positive for polydipsia.  Psychiatric/Behavioral: The patient has insomnia.     Objective:  Physical Exam  Constitutional: She is oriented to person, place, and time. She appears well-developed and well-nourished.  HENT:  Head: Normocephalic and atraumatic.  Eyes: Pupils are equal, round, and reactive to light.  Neck: Normal range of motion. Neck supple.  Cardiovascular: Intact distal pulses.   Respiratory: Effort normal.  Musculoskeletal: She exhibits tenderness.  the patient's left knee has good strength and good range of motion.  Patient's right knee does have tenderness laterally.  Mild effusion.  No erythema or warmth.  No instability.  She has limited range of motion today due to pain.  She has a range from roughly 0-90.  Calves are  soft and nontender.  Neurological: She is alert and oriented to person, place, and time.  Skin: Skin is warm and dry.    Vital signs in last 24 hours:    Labs:   Estimated body mass index is 25.66 kg/m as calculated from the following:   Height as of 10/31/16: 5\' 2"  (1.575 m).   Weight as of 10/31/16: 63.6 kg (140 lb 4.8 oz).   Imaging Review Plain radiographs demonstrate Multiple views of the right knee are reviewed in office today.  No fracture dislocation identified.  She  has obvious lateral compartment arthritis.   Assessment/Plan:  End stage arthritis, right knee   The patient history, physical examination, clinical judgment of the provider and imaging studies are consistent with end stage degenerative joint disease of the right knee(s) and total knee arthroplasty is deemed medically necessary. The treatment options including medical management, injection therapy arthroscopy and arthroplasty were discussed at length. The risks and benefits of total knee arthroplasty were presented and reviewed. The risks due to aseptic loosening, infection, stiffness, patella tracking problems, thromboembolic complications and other imponderables were discussed. The patient acknowledged the explanation, agreed to proceed with the plan and consent was signed. Patient is being admitted for inpatient treatment for surgery, pain control, PT, OT, prophylactic antibiotics, VTE prophylaxis, progressive ambulation and ADL's and discharge planning. The patient is planning to be discharged to skilled nursing facility

## 2016-11-13 ENCOUNTER — Inpatient Hospital Stay (HOSPITAL_COMMUNITY): Payer: Commercial Managed Care - HMO | Admitting: Emergency Medicine

## 2016-11-13 ENCOUNTER — Encounter (HOSPITAL_COMMUNITY)
Admission: RE | Disposition: A | Discharge status: Skilled Nursing Facility | Payer: Self-pay | Source: Ambulatory Visit | Attending: Orthopedic Surgery

## 2016-11-13 ENCOUNTER — Inpatient Hospital Stay (HOSPITAL_COMMUNITY)
Admission: RE | Admit: 2016-11-13 | Discharge: 2016-11-16 | DRG: 470 | Disposition: A | Discharge status: Skilled Nursing Facility | Payer: Commercial Managed Care - HMO | Source: Ambulatory Visit | Attending: Orthopedic Surgery | Admitting: Orthopedic Surgery

## 2016-11-13 ENCOUNTER — Encounter (HOSPITAL_COMMUNITY): Payer: Self-pay | Admitting: *Deleted

## 2016-11-13 DIAGNOSIS — I2510 Atherosclerotic heart disease of native coronary artery without angina pectoris: Secondary | ICD-10-CM | POA: Diagnosis not present

## 2016-11-13 DIAGNOSIS — F0390 Unspecified dementia without behavioral disturbance: Secondary | ICD-10-CM | POA: Diagnosis present

## 2016-11-13 DIAGNOSIS — M25561 Pain in right knee: Secondary | ICD-10-CM | POA: Diagnosis present

## 2016-11-13 DIAGNOSIS — E785 Hyperlipidemia, unspecified: Secondary | ICD-10-CM | POA: Diagnosis present

## 2016-11-13 DIAGNOSIS — I10 Essential (primary) hypertension: Secondary | ICD-10-CM | POA: Diagnosis present

## 2016-11-13 DIAGNOSIS — R531 Weakness: Secondary | ICD-10-CM | POA: Diagnosis not present

## 2016-11-13 DIAGNOSIS — Z90710 Acquired absence of both cervix and uterus: Secondary | ICD-10-CM | POA: Diagnosis not present

## 2016-11-13 DIAGNOSIS — R278 Other lack of coordination: Secondary | ICD-10-CM | POA: Diagnosis not present

## 2016-11-13 DIAGNOSIS — M1990 Unspecified osteoarthritis, unspecified site: Secondary | ICD-10-CM | POA: Diagnosis not present

## 2016-11-13 DIAGNOSIS — M1711 Unilateral primary osteoarthritis, right knee: Principal | ICD-10-CM | POA: Diagnosis present

## 2016-11-13 DIAGNOSIS — Z87891 Personal history of nicotine dependence: Secondary | ICD-10-CM | POA: Diagnosis not present

## 2016-11-13 DIAGNOSIS — I252 Old myocardial infarction: Secondary | ICD-10-CM

## 2016-11-13 DIAGNOSIS — Z96651 Presence of right artificial knee joint: Secondary | ICD-10-CM | POA: Diagnosis not present

## 2016-11-13 DIAGNOSIS — D649 Anemia, unspecified: Secondary | ICD-10-CM | POA: Diagnosis present

## 2016-11-13 DIAGNOSIS — R2689 Other abnormalities of gait and mobility: Secondary | ICD-10-CM | POA: Diagnosis not present

## 2016-11-13 DIAGNOSIS — G8918 Other acute postprocedural pain: Secondary | ICD-10-CM | POA: Diagnosis not present

## 2016-11-13 DIAGNOSIS — Z471 Aftercare following joint replacement surgery: Secondary | ICD-10-CM | POA: Diagnosis not present

## 2016-11-13 DIAGNOSIS — Z8249 Family history of ischemic heart disease and other diseases of the circulatory system: Secondary | ICD-10-CM

## 2016-11-13 HISTORY — PX: TOTAL KNEE ARTHROPLASTY: SHX125

## 2016-11-13 LAB — ABO/RH: ABO/RH(D): O POS

## 2016-11-13 LAB — TYPE AND SCREEN
ABO/RH(D): O POS
ANTIBODY SCREEN: NEGATIVE

## 2016-11-13 LAB — POCT I-STAT 4, (NA,K, GLUC, HGB,HCT)
GLUCOSE: 101 mg/dL — AB (ref 65–99)
HCT: 29 % — ABNORMAL LOW (ref 36.0–46.0)
Hemoglobin: 9.9 g/dL — ABNORMAL LOW (ref 12.0–15.0)
Potassium: 3.3 mmol/L — ABNORMAL LOW (ref 3.5–5.1)
Sodium: 135 mmol/L (ref 135–145)

## 2016-11-13 SURGERY — ARTHROPLASTY, KNEE, TOTAL
Anesthesia: Spinal | Site: Knee | Laterality: Right

## 2016-11-13 MED ORDER — ONDANSETRON HCL 4 MG PO TABS
4.0000 mg | ORAL_TABLET | Freq: Four times a day (QID) | ORAL | Status: DC | PRN
  Filled 2016-11-13: qty 1

## 2016-11-13 MED ORDER — PROPOFOL 500 MG/50ML IV EMUL
INTRAVENOUS | Status: DC | PRN
  Administered 2016-11-13: 50 ug/kg/min via INTRAVENOUS

## 2016-11-13 MED ORDER — METOCLOPRAMIDE HCL 5 MG PO TABS: 5.0000 mg | ORAL_TABLET | Freq: Three times a day (TID) | ORAL | Status: DC | PRN

## 2016-11-13 MED ORDER — HYDROCHLOROTHIAZIDE 25 MG PO TABS
25.0000 mg | ORAL_TABLET | Freq: Every day | ORAL | Status: DC
  Administered 2016-11-14 – 2016-11-16 (×3): 25 mg via ORAL
  Filled 2016-11-13 (×3): qty 1

## 2016-11-13 MED ORDER — POTASSIUM CHLORIDE CRYS ER 20 MEQ PO TBCR
20.0000 meq | EXTENDED_RELEASE_TABLET | Freq: Every day | ORAL | Status: DC
  Administered 2016-11-13 – 2016-11-16 (×4): 20 meq via ORAL
  Filled 2016-11-13 (×4): qty 1

## 2016-11-13 MED ORDER — CEFAZOLIN SODIUM-DEXTROSE 2-4 GM/100ML-% IV SOLN
INTRAVENOUS | Status: AC
  Filled 2016-11-13: qty 100

## 2016-11-13 MED ORDER — FENTANYL CITRATE (PF) 100 MCG/2ML IJ SOLN
50.0000 ug | Freq: Once | INTRAMUSCULAR | Status: AC
  Administered 2016-11-13: 50 ug via INTRAVENOUS

## 2016-11-13 MED ORDER — LOSARTAN POTASSIUM 50 MG PO TABS
100.0000 mg | ORAL_TABLET | Freq: Every day | ORAL | Status: DC
  Administered 2016-11-14 – 2016-11-16 (×3): 100 mg via ORAL
  Filled 2016-11-13 (×3): qty 2

## 2016-11-13 MED ORDER — TRANEXAMIC ACID 1000 MG/10ML IV SOLN
INTRAVENOUS | Status: DC | PRN
  Administered 2016-11-13: 2000 mg via TOPICAL

## 2016-11-13 MED ORDER — DEXTROSE-NACL 5-0.45 % IV SOLN: INTRAVENOUS | Status: DC

## 2016-11-13 MED ORDER — ONDANSETRON HCL 4 MG/2ML IJ SOLN
INTRAMUSCULAR | Status: AC
  Filled 2016-11-13: qty 2

## 2016-11-13 MED ORDER — DOCUSATE SODIUM 100 MG PO CAPS
100.0000 mg | ORAL_CAPSULE | Freq: Two times a day (BID) | ORAL | Status: DC
  Administered 2016-11-13 – 2016-11-16 (×7): 100 mg via ORAL
  Filled 2016-11-13 (×7): qty 1

## 2016-11-13 MED ORDER — ASPIRIN EC 325 MG PO TBEC
325.0000 mg | DELAYED_RELEASE_TABLET | Freq: Every day | ORAL | Status: DC
  Administered 2016-11-14 – 2016-11-16 (×3): 325 mg via ORAL
  Filled 2016-11-13 (×3): qty 1

## 2016-11-13 MED ORDER — ACETAMINOPHEN 650 MG RE SUPP: 650.0000 mg | Freq: Four times a day (QID) | RECTAL | Status: DC | PRN

## 2016-11-13 MED ORDER — MIDAZOLAM HCL 2 MG/2ML IJ SOLN: 0.5000 mg | Freq: Once | INTRAMUSCULAR | Status: DC | PRN

## 2016-11-13 MED ORDER — LOSARTAN POTASSIUM-HCTZ 100-25 MG PO TABS: 1.0000 | ORAL_TABLET | ORAL | Status: DC

## 2016-11-13 MED ORDER — CEFAZOLIN SODIUM-DEXTROSE 2-4 GM/100ML-% IV SOLN
2.0000 g | INTRAVENOUS | Status: AC
  Administered 2016-11-13: 2 g via INTRAVENOUS

## 2016-11-13 MED ORDER — BUPIVACAINE LIPOSOME 1.3 % IJ SUSP
INTRAMUSCULAR | Status: DC | PRN
  Administered 2016-11-13: 20 mL

## 2016-11-13 MED ORDER — TIZANIDINE HCL 2 MG PO TABS: 2.0000 mg | ORAL_TABLET | Freq: Four times a day (QID) | ORAL | 0 refills | Status: DC | PRN

## 2016-11-13 MED ORDER — CHLORHEXIDINE GLUCONATE 4 % EX LIQD: 60.0000 mL | Freq: Once | CUTANEOUS | Status: DC

## 2016-11-13 MED ORDER — FENTANYL CITRATE (PF) 100 MCG/2ML IJ SOLN: 25.0000 ug | INTRAMUSCULAR | Status: DC | PRN

## 2016-11-13 MED ORDER — EPHEDRINE SULFATE 50 MG/ML IJ SOLN
INTRAMUSCULAR | Status: DC | PRN
  Administered 2016-11-13: 5 mg via INTRAVENOUS

## 2016-11-13 MED ORDER — PHENOL 1.4 % MT LIQD: 1.0000 | OROMUCOSAL | Status: DC | PRN

## 2016-11-13 MED ORDER — OXYCODONE HCL 5 MG PO TABS
5.0000 mg | ORAL_TABLET | ORAL | Status: DC | PRN
  Administered 2016-11-13 – 2016-11-15 (×6): 10 mg via ORAL
  Filled 2016-11-13 (×6): qty 2

## 2016-11-13 MED ORDER — MENTHOL 3 MG MT LOZG: 1.0000 | LOZENGE | OROMUCOSAL | Status: DC | PRN

## 2016-11-13 MED ORDER — LACTATED RINGERS IV SOLN
INTRAVENOUS | Status: DC
Start: 2016-11-13 — End: 2016-11-13
  Administered 2016-11-13 (×3): via INTRAVENOUS

## 2016-11-13 MED ORDER — PROPOFOL 10 MG/ML IV BOLUS
INTRAVENOUS | Status: AC
  Filled 2016-11-13: qty 20

## 2016-11-13 MED ORDER — ACETAMINOPHEN 325 MG PO TABS
650.0000 mg | ORAL_TABLET | Freq: Four times a day (QID) | ORAL | Status: DC | PRN
  Administered 2016-11-14 – 2016-11-16 (×3): 650 mg via ORAL
  Filled 2016-11-13 (×3): qty 2

## 2016-11-13 MED ORDER — EPINEPHRINE PF 1 MG/ML IJ SOLN
INTRAMUSCULAR | Status: AC
  Filled 2016-11-13: qty 1

## 2016-11-13 MED ORDER — ONDANSETRON HCL 4 MG/2ML IJ SOLN: 4.0000 mg | Freq: Four times a day (QID) | INTRAMUSCULAR | Status: DC | PRN

## 2016-11-13 MED ORDER — HYDROMORPHONE HCL 2 MG/ML IJ SOLN: 1.0000 mg | INTRAMUSCULAR | Status: DC | PRN

## 2016-11-13 MED ORDER — CEFUROXIME SODIUM 1.5 G IJ SOLR
INTRAMUSCULAR | Status: AC
  Filled 2016-11-13: qty 1.5

## 2016-11-13 MED ORDER — SENNOSIDES-DOCUSATE SODIUM 8.6-50 MG PO TABS: 1.0000 | ORAL_TABLET | Freq: Every evening | ORAL | Status: DC | PRN

## 2016-11-13 MED ORDER — FENTANYL CITRATE (PF) 100 MCG/2ML IJ SOLN
INTRAMUSCULAR | Status: AC
  Filled 2016-11-13: qty 2

## 2016-11-13 MED ORDER — SODIUM CHLORIDE 0.9 % IJ SOLN
INTRAMUSCULAR | Status: DC | PRN
  Administered 2016-11-13: 50 mL

## 2016-11-13 MED ORDER — AMOXICILLIN 500 MG PO CAPS
500.0000 mg | ORAL_CAPSULE | Freq: Two times a day (BID) | ORAL | Status: DC
  Administered 2016-11-13 – 2016-11-15 (×5): 500 mg via ORAL
  Filled 2016-11-13 (×6): qty 1

## 2016-11-13 MED ORDER — TRAMADOL HCL 50 MG PO TABS
50.0000 mg | ORAL_TABLET | Freq: Two times a day (BID) | ORAL | Status: DC | PRN
  Administered 2016-11-14: 50 mg via ORAL
  Filled 2016-11-13: qty 1

## 2016-11-13 MED ORDER — PHENYLEPHRINE HCL 10 MG/ML IJ SOLN
INTRAVENOUS | Status: DC | PRN
  Administered 2016-11-13: 10 ug/min via INTRAVENOUS

## 2016-11-13 MED ORDER — METHOCARBAMOL 500 MG PO TABS
500.0000 mg | ORAL_TABLET | Freq: Four times a day (QID) | ORAL | Status: DC | PRN
  Administered 2016-11-15: 500 mg via ORAL
  Filled 2016-11-13: qty 1

## 2016-11-13 MED ORDER — PROPOFOL 10 MG/ML IV BOLUS
INTRAVENOUS | Status: DC | PRN
  Administered 2016-11-13: 20 mg via INTRAVENOUS

## 2016-11-13 MED ORDER — ASPIRIN EC 325 MG PO TBEC: 325.0000 mg | DELAYED_RELEASE_TABLET | Freq: Two times a day (BID) | ORAL | 0 refills | Status: DC

## 2016-11-13 MED ORDER — ONDANSETRON HCL 4 MG/2ML IJ SOLN
INTRAMUSCULAR | Status: DC | PRN
  Administered 2016-11-13: 4 mg via INTRAVENOUS

## 2016-11-13 MED ORDER — OXYCODONE-ACETAMINOPHEN 5-325 MG PO TABS: 1.0000 | ORAL_TABLET | ORAL | 0 refills | Status: DC | PRN

## 2016-11-13 MED ORDER — BISACODYL 5 MG PO TBEC: 5.0000 mg | DELAYED_RELEASE_TABLET | Freq: Every day | ORAL | Status: DC | PRN

## 2016-11-13 MED ORDER — FUROSEMIDE 20 MG PO TABS
20.0000 mg | ORAL_TABLET | Freq: Every day | ORAL | Status: DC
  Administered 2016-11-13 – 2016-11-16 (×4): 20 mg via ORAL
  Filled 2016-11-13 (×4): qty 1

## 2016-11-13 MED ORDER — FENTANYL CITRATE (PF) 100 MCG/2ML IJ SOLN
INTRAMUSCULAR | Status: AC
  Administered 2016-11-13: 50 ug via INTRAVENOUS
  Filled 2016-11-13: qty 2

## 2016-11-13 MED ORDER — DIPHENHYDRAMINE HCL 12.5 MG/5ML PO ELIX: 12.5000 mg | ORAL_SOLUTION | ORAL | Status: DC | PRN

## 2016-11-13 MED ORDER — ALUM & MAG HYDROXIDE-SIMETH 200-200-20 MG/5ML PO SUSP
30.0000 mL | ORAL | Status: DC | PRN
Start: 2016-11-13 — End: 2016-11-16

## 2016-11-13 MED ORDER — METOPROLOL TARTRATE 50 MG PO TABS
50.0000 mg | ORAL_TABLET | Freq: Two times a day (BID) | ORAL | Status: DC
  Administered 2016-11-13 – 2016-11-16 (×6): 50 mg via ORAL
  Filled 2016-11-13 (×6): qty 1

## 2016-11-13 MED ORDER — MEPERIDINE HCL 25 MG/ML IJ SOLN: 6.2500 mg | INTRAMUSCULAR | Status: DC | PRN

## 2016-11-13 MED ORDER — EPHEDRINE 5 MG/ML INJ
INTRAVENOUS | Status: AC
  Filled 2016-11-13: qty 10

## 2016-11-13 MED ORDER — METHOCARBAMOL 1000 MG/10ML IJ SOLN
500.0000 mg | Freq: Four times a day (QID) | INTRAVENOUS | Status: DC | PRN
  Filled 2016-11-13: qty 5

## 2016-11-13 MED ORDER — METOCLOPRAMIDE HCL 5 MG/ML IJ SOLN: 5.0000 mg | Freq: Three times a day (TID) | INTRAMUSCULAR | Status: DC | PRN

## 2016-11-13 MED ORDER — PROMETHAZINE HCL 25 MG/ML IJ SOLN: 6.2500 mg | INTRAMUSCULAR | Status: DC | PRN

## 2016-11-13 MED ORDER — KCL IN DEXTROSE-NACL 20-5-0.45 MEQ/L-%-% IV SOLN
INTRAVENOUS | Status: DC
  Administered 2016-11-13: 16:00:00 via INTRAVENOUS
  Filled 2016-11-13: qty 1000

## 2016-11-13 MED ORDER — FLEET ENEMA 7-19 GM/118ML RE ENEM: 1.0000 | ENEMA | Freq: Once | RECTAL | Status: DC | PRN

## 2016-11-13 MED ORDER — CEFUROXIME SODIUM 1.5 G IJ SOLR
INTRAMUSCULAR | Status: DC | PRN
  Administered 2016-11-13: 1.5 g

## 2016-11-13 MED ORDER — BUPIVACAINE IN DEXTROSE 0.75-8.25 % IT SOLN
INTRATHECAL | Status: DC | PRN
  Administered 2016-11-13: 10.5 mg via INTRATHECAL

## 2016-11-13 MED ORDER — SODIUM CHLORIDE 0.9 % IR SOLN
Status: DC | PRN
  Administered 2016-11-13: 1

## 2016-11-13 MED ORDER — BUPIVACAINE-EPINEPHRINE (PF) 0.25% -1:200000 IJ SOLN
INTRAMUSCULAR | Status: DC | PRN
  Administered 2016-11-13: 50 mL

## 2016-11-13 MED ORDER — AMITRIPTYLINE HCL 50 MG PO TABS
100.0000 mg | ORAL_TABLET | Freq: Every day | ORAL | Status: DC
  Administered 2016-11-13 – 2016-11-15 (×3): 100 mg via ORAL
  Filled 2016-11-13 (×3): qty 2

## 2016-11-13 MED ORDER — ROPIVACAINE HCL 7.5 MG/ML IJ SOLN
INTRAMUSCULAR | Status: DC | PRN
  Administered 2016-11-13: 20 mL via PERINEURAL

## 2016-11-13 MED ORDER — BUPIVACAINE HCL (PF) 0.25 % IJ SOLN
INTRAMUSCULAR | Status: AC
  Filled 2016-11-13: qty 60

## 2016-11-13 MED ORDER — FENTANYL CITRATE (PF) 100 MCG/2ML IJ SOLN
INTRAMUSCULAR | Status: DC | PRN
Start: 2016-11-13 — End: 2016-11-13
  Administered 2016-11-13: 25 ug via INTRAVENOUS

## 2016-11-13 SURGICAL SUPPLY — 52 items
BANDAGE ACE 6X5 VEL STRL LF (GAUZE/BANDAGES/DRESSINGS) ×3 IMPLANT
BANDAGE ESMARK 6X9 LF (GAUZE/BANDAGES/DRESSINGS) ×1 IMPLANT
BLADE SAG 18X100X1.27 (BLADE) ×3 IMPLANT
BLADE SAW SGTL 13X75X1.27 (BLADE) ×3 IMPLANT
BNDG CMPR 9X6 STRL LF SNTH (GAUZE/BANDAGES/DRESSINGS) ×1
BNDG CMPR MED 10X6 ELC LF (GAUZE/BANDAGES/DRESSINGS) ×1
BNDG ELASTIC 6X10 VLCR STRL LF (GAUZE/BANDAGES/DRESSINGS) ×3 IMPLANT
BNDG ESMARK 6X9 LF (GAUZE/BANDAGES/DRESSINGS) ×3
BOWL SMART MIX CTS (DISPOSABLE) ×3 IMPLANT
CAPT KNEE TOTAL 3 ATTUNE ×3 IMPLANT
CEMENT HV SMART SET (Cement) ×6 IMPLANT
COVER SURGICAL LIGHT HANDLE (MISCELLANEOUS) ×3 IMPLANT
CUFF TOURNIQUET SINGLE 34IN LL (TOURNIQUET CUFF) ×3 IMPLANT
CUFF TOURNIQUET SINGLE 44IN (TOURNIQUET CUFF) IMPLANT
DRAPE EXTREMITY T 121X128X90 (DRAPE) ×3 IMPLANT
DRAPE U-SHAPE 47X51 STRL (DRAPES) ×3 IMPLANT
DRSG AQUACEL AG ADV 3.5X10 (GAUZE/BANDAGES/DRESSINGS) ×3 IMPLANT
DURAPREP 26ML APPLICATOR (WOUND CARE) ×3 IMPLANT
ELECT REM PT RETURN 9FT ADLT (ELECTROSURGICAL) ×3
ELECTRODE REM PT RTRN 9FT ADLT (ELECTROSURGICAL) ×1 IMPLANT
GLOVE BIO SURGEON STRL SZ7.5 (GLOVE) ×3 IMPLANT
GLOVE BIO SURGEON STRL SZ8.5 (GLOVE) ×3 IMPLANT
GLOVE BIOGEL PI IND STRL 8 (GLOVE) ×2 IMPLANT
GLOVE BIOGEL PI IND STRL 9 (GLOVE) ×1 IMPLANT
GLOVE BIOGEL PI INDICATOR 8 (GLOVE) ×4
GLOVE BIOGEL PI INDICATOR 9 (GLOVE) ×2
GOWN STRL REUS W/ TWL LRG LVL3 (GOWN DISPOSABLE) ×1 IMPLANT
GOWN STRL REUS W/ TWL XL LVL3 (GOWN DISPOSABLE) ×2 IMPLANT
GOWN STRL REUS W/TWL LRG LVL3 (GOWN DISPOSABLE) ×3
GOWN STRL REUS W/TWL XL LVL3 (GOWN DISPOSABLE) ×6
HANDPIECE INTERPULSE COAX TIP (DISPOSABLE) ×3
HOOD PEEL AWAY FACE SHEILD DIS (HOOD) ×6 IMPLANT
KIT BASIN OR (CUSTOM PROCEDURE TRAY) ×3 IMPLANT
KIT ROOM TURNOVER OR (KITS) ×3 IMPLANT
MANIFOLD NEPTUNE II (INSTRUMENTS) ×3 IMPLANT
NEEDLE 22X1 1/2 (OR ONLY) (NEEDLE) ×6 IMPLANT
NS IRRIG 1000ML POUR BTL (IV SOLUTION) ×3 IMPLANT
PACK TOTAL JOINT (CUSTOM PROCEDURE TRAY) ×3 IMPLANT
PAD ARMBOARD 7.5X6 YLW CONV (MISCELLANEOUS) ×6 IMPLANT
SET HNDPC FAN SPRY TIP SCT (DISPOSABLE) ×1 IMPLANT
SUT VIC AB 0 CT1 27 (SUTURE) ×3
SUT VIC AB 0 CT1 27XBRD ANBCTR (SUTURE) ×1 IMPLANT
SUT VIC AB 1 CTX 36 (SUTURE) ×3
SUT VIC AB 1 CTX36XBRD ANBCTR (SUTURE) ×1 IMPLANT
SUT VIC AB 2-0 CT1 27 (SUTURE) ×3
SUT VIC AB 2-0 CT1 TAPERPNT 27 (SUTURE) ×1 IMPLANT
SUT VIC AB 3-0 CT1 27 (SUTURE) ×3
SUT VIC AB 3-0 CT1 TAPERPNT 27 (SUTURE) ×1 IMPLANT
SYR CONTROL 10ML LL (SYRINGE) ×6 IMPLANT
TOWEL OR 17X24 6PK STRL BLUE (TOWEL DISPOSABLE) ×3 IMPLANT
TOWEL OR 17X26 10 PK STRL BLUE (TOWEL DISPOSABLE) ×3 IMPLANT
TRAY CATH 16FR W/PLASTIC CATH (SET/KITS/TRAYS/PACK) ×3 IMPLANT

## 2016-11-13 NOTE — Anesthesia Procedure Notes (Addendum)
Anesthesia Regional Block:  Adductor canal block  Pre-Anesthetic Checklist: ,, timeout performed, Correct Patient, Correct Site, Correct Laterality, Correct Procedure, Correct Position, site marked, Risks and benefits discussed,  Surgical consent,  Pre-op evaluation,  At surgeon's request and post-op pain management  Laterality: Right and Lower  Prep: chloraprep       Needles:  Injection technique: Single-shot  Needle Type: Echogenic Needle     Needle Length: 9cm 9 cm Needle Gauge: 22 and 22 G    Additional Needles:  Procedures: ultrasound guided (picture in chart) Adductor canal block Narrative:  Start time: 11/13/2016 9:16 AM End time: 11/13/2016 9:23 AM Injection made incrementally with aspirations every 5 mL.  Performed by: Personally  Anesthesiologist: Glennon Mac, Shiniqua Groseclose  Additional Notes: Pt identified in Holding room.  Monitors applied. Working IV access confirmed. Sterile prep, drape R thigh.  #22ga ECHOgenic needle into adductor canal with US guidance.  20cc 0.75% Ropivacaine injected incrementally after negative test dose, good spread into canal.  Patient asymptomatic, VSS, no heme aspirated, tolerated well.

## 2016-11-13 NOTE — Progress Notes (Signed)
Dr. Mayer Camel notified that patient has been taken naproxen 500 mg twice a day

## 2016-11-13 NOTE — Anesthesia Preprocedure Evaluation (Signed)
Anesthesia Evaluation    Airway        Dental   Pulmonary           Cardiovascular hypertension, Pt. on medications and Pt. on home beta blockers + CAD, + Past MI and + Cardiac Stents    9/17 ECHO: EF 50-55%, valves OK 09/07/10 w/3 vessel CAD, DES in RCA, Diagonal & LAD   Neuro/Psych    GI/Hepatic   Endo/Other    Renal/GU      Musculoskeletal  (+) Arthritis , Osteoarthritis,    Abdominal   Peds  Hematology  (+) Blood dyscrasia (Hb 9.9), ,   Anesthesia Other Findings   Reproductive/Obstetrics                             Anesthesia Physical Anesthesia Plan Anesthesia Quick Evaluation

## 2016-11-13 NOTE — Anesthesia Preprocedure Evaluation (Addendum)
Anesthesia Evaluation  Patient identified by MRN, date of birth, ID band Patient awake    Reviewed: Allergy & Precautions, NPO status , Patient's Chart, lab work & pertinent test results, reviewed documented beta blocker date and time   History of Anesthesia Complications Negative for: history of anesthetic complications  Airway Mallampati: II  TM Distance: >3 FB Neck ROM: Full    Dental  (+) Chipped, Missing, Dental Advisory Given, Caps   Pulmonary former smoker,    breath sounds clear to auscultation       Cardiovascular hypertension, Pt. on medications and Pt. on home beta blockers + CAD and + Past MI   Rhythm:Regular Rate:Normal  9/17 ECHO: EF 50-55%, valves OK cath 09/07/10 w/3 vessel CAD, DES in RCA, Diagonal & LAD   Neuro/Psych dementianegative neurological ROS     GI/Hepatic negative GI ROS, Neg liver ROS,   Endo/Other  negative endocrine ROS  Renal/GU      Musculoskeletal  (+) Arthritis ,   Abdominal   Peds  Hematology  (+) Blood dyscrasia (Hb 9.9), ,   Anesthesia Other Findings   Reproductive/Obstetrics                           Anesthesia Physical Anesthesia Plan  ASA: III  Anesthesia Plan: Spinal   Post-op Pain Management:  Regional for Post-op pain   Induction:   Airway Management Planned: Natural Airway and Simple Face Mask  Additional Equipment:   Intra-op Plan:   Post-operative Plan:   Informed Consent: I have reviewed the patients History and Physical, chart, labs and discussed the procedure including the risks, benefits and alternatives for the proposed anesthesia with the patient or authorized representative who has indicated his/her understanding and acceptance.   Dental advisory given  Plan Discussed with: CRNA and Surgeon  Anesthesia Plan Comments: (Plan routine monitors, SAB with adductor canal block for post op analgesia)        Anesthesia  Quick Evaluation

## 2016-11-13 NOTE — Transfer of Care (Signed)
Immediate Anesthesia Transfer of Care Note  Patient: Rebecca Ford  Procedure(s) Performed: Procedure(s): TOTAL KNEE ARTHROPLASTY (Right)  Patient Location: PACU  Anesthesia Type:Spinal  Level of Consciousness: awake, oriented and patient cooperative  Airway & Oxygen Therapy: Patient Spontanous Breathing  Post-op Assessment: Report given to RN, Post -op Vital signs reviewed and stable and Patient moving all extremities  Post vital signs: Reviewed and stable  Last Vitals:  Vitals:   11/13/16 0931 11/13/16 0932  BP:  (!) 177/62  Pulse: (!) 58 (!) 59  Resp: 16 16  Temp:      Last Pain:  Vitals:   11/13/16 0812  TempSrc: Oral         Complications: No apparent anesthesia complications

## 2016-11-13 NOTE — Interval H&P Note (Signed)
History and Physical Interval Note:  11/13/2016 8:59 AM  Rebecca Ford  has presented today for surgery, with the diagnosis of RIGHT KNEE OSTEOARTHRITIS  The various methods of treatment have been discussed with the patient and family. After consideration of risks, benefits and other options for treatment, the patient has consented to  Procedure(s): TOTAL KNEE ARTHROPLASTY (Right) as a surgical intervention .  The patient's history has been reviewed, patient examined, no change in status, stable for surgery.  I have reviewed the patient's chart and labs.  Questions were answered to the patient's satisfaction.     Kerin Salen

## 2016-11-13 NOTE — H&P (View-Only) (Signed)
Dr. Mayer Camel notified that patient has been taken naproxen 500 mg twice a day

## 2016-11-13 NOTE — Anesthesia Procedure Notes (Signed)
Spinal  Patient location during procedure: OR End time: 11/13/2016 10:10 AM Staffing Anesthesiologist: Annye Asa Performed: anesthesiologist  Preanesthetic Checklist Completed: patient identified, site marked, surgical consent, pre-op evaluation, timeout performed, IV checked, risks and benefits discussed and monitors and equipment checked Spinal Block Patient position: sitting Prep: ChloraPrep and site prepped and draped Patient monitoring: blood pressure, continuous pulse ox, heart rate and cardiac monitor Approach: midline Location: L3-4 Needle Needle type: Quincke  Needle gauge: 25 G Needle length: 9 cm Additional Notes Pt identified in Operating room.  Monitors applied. Working IV access confirmed. Sterile prep, drape lumbar spine.  1% lido local L 3,4.  #25ga Quincke into clear CSF L 3,4.  10.5 mg 0.75% Bupivacaine with dextrose injected with asp CSF beginning and end of injection.  Patient asymptomatic, VSS, trace heme aspirated, tolerated well.  Jenita Seashore, MD

## 2016-11-13 NOTE — Op Note (Signed)
PATIENT ID:      Rebecca Ford  MRN:     [id number] DOB/AGE:    [birth year] / 82 y.o.       OPERATIVE REPORT    DATE OF PROCEDURE:  11/13/2016       PREOPERATIVE DIAGNOSIS:   RIGHT KNEE OSTEOARTHRITIS      Estimated body mass index is 25.61 kg/m as calculated from the following:   Height as of 10/31/16: 5\' 2"  (1.575 m).   Weight as of this encounter: 63.5 kg (140 lb).                                                        POSTOPERATIVE DIAGNOSIS:   RIGHT KNEE OSTEOARTHRITIS                                                                      PROCEDURE:  Procedure(s): TOTAL KNEE ARTHROPLASTY Using DepuyAttune RP implants #5R Femur, #6Tibia, 5 mm Attune RP bearing, 38 Patella     SURGEON: Leeba Barbe J    ASSISTANT:   [employer]   (Present and scrubbed throughout the case, critical for assistance with exposure, retraction, instrumentation, and closure.)         ANESTHESIA: Spinal, 20cc Exparel, 50cc 0.25% Marcaine  EBL: 300  FLUID REPLACEMENT: 1600 crystalloid  TOURNIQUET TIME: 16min  Drains: None  Tranexamic Acid: 1gm iv, 2gm topical   COMPLICATIONS:  None         INDICATIONS FOR PROCEDURE: The patient has  RIGHT KNEE OSTEOARTHRITIS, Val deformities, XR shows bone on bone arthritis, lateral subluxation of tibia. Patient has failed all conservative measures including anti-inflammatory medicines, narcotics, attempts at  exercise and weight loss, cortisone injections and viscosupplementation.  Risks and benefits of surgery have been discussed, questions answered.   DESCRIPTION OF PROCEDURE: The patient identified by armband, received  IV antibiotics, in the holding area at [facility]. Patient taken to the operating room, appropriate anesthetic  monitors were attached, and spinal anesthesia was  induced. Tourniquet  applied high to the operative thigh. Lateral post and foot positioner  applied to the table, the lower extremity was then prepped and draped  in usual  sterile fashion from the toes to the tourniquet. Time-out procedure was performed. We began the operation, with the knee flexed 120 degrees, by making the anterior midline incision starting at handbreadth above the patella going over the patella 1 cm medial to and 4 cm distal to the tibial tubercle. Small bleeders in the skin and the  subcutaneous tissue identified and cauterized. Transverse retinaculum was incised and reflected medially and a medial parapatellar arthrotomy was accomplished. the patella was everted and theprepatellar fat pad resected. The superficial medial collateral  ligament was then elevated from anterior to posterior along the proximal  flare of the tibia and anterior half of the menisci resected. The knee was hyperflexed exposing bone on bone arthritis. Peripheral and notch osteophytes as well as the cruciate ligaments were then resected. We continued to  work our way around posteriorly along the proximal tibia,  and externally  rotated the tibia subluxing it out from underneath the femur. A McHale  retractor was placed through the notch and a lateral Hohmann retractor  placed, and we then drilled through the proximal tibia in line with the  axis of the tibia followed by an intramedullary guide rod and 2-degree  posterior slope cutting guide. The tibial cutting guide, 3 degree posterior sloped, was pinned into place allowing resection of 7 mm of bone medially and 0 mm of bone laterally. Satisfied with the tibial resection, we then  entered the distal femur 2 mm anterior to the PCL origin with the  intramedullary guide rod and applied the distal femoral cutting guide  set at 9 mm, with 5 degrees of valgus. This was pinned along the  epicondylar axis. At this point, the distal femoral cut was accomplished without difficulty. We then sized for a #5R femoral component and pinned the guide in 0 degrees of external rotation. The chamfer cutting guide was pinned into place. The anterior,  posterior, and chamfer cuts were accomplished without difficulty followed by  the Attune RP box cutting guide and the box cut. We also removed posterior osteophytes from the posterior femoral condyles. At this  time, the knee was brought into full extension. We checked our  extension and flexion gaps and found them symmetric for a 5 mm bearing. Distracting in extension with a lamina spreader, the posterior horns of the menisci were removed, and Exparel, diluted to 60 cc, with 20cc NS, and 20cc 0.5% Marcaine,was injected into the capsule and synovium of the knee. The posterior patella cut was accomplished with the 9.5 mm Attune cutting guide, sized for a 13mm dome, and the fixation pegs drilled.The knee  was then once again hyperflexed exposing the proximal tibia. We sized for a # 6 tibial base plate, applied the smokestack and the conical reamer followed by the the Delta fin keel punch. We then hammered into place the Attune RP trial femoral component, drilled the lugs, inserted a  5 mm trial bearing, trial patellar button, and took the knee through range of motion from 0-130 degrees. No thumb pressure was required for patellar Tracking. At this point, the limb was wrapped with an Esmarch bandage and the tourniquet inflated to 350 mmHg. All trial components were removed, mating surfaces irrigated with pulse lavage, and dried with suction and sponges. A double batch of DePuy HV cement with 1500 mg of Zinacef was mixed and applied to all bony metallic mating surfaces except for the posterior condyles of the femur itself. In order, we  hammered into place the tibial tray and removed excess cement, the femoral component and removed excess cement. The final Attune RP bearing  was inserted, and the knee brought to full extension with compression.  The patellar button was clamped into place, and excess cement  removed. While the cement cured the wound was irrigated out with normal saline solution pulse lavage.  Ligament stability and patellar tracking were checked and found to be excellent. The parapatellar arthrotomy was closed with  running #1 Vicryl suture. The subcutaneous tissue with 0 and 2-0 undyed  Vicryl suture, and the skin with running 3-0 SQ vicryl. A dressing of Xeroform,  4 x 4, dressing sponges, Webril, and Ace wrap applied. The patient  awakened, and taken to recovery room without difficulty.   Chloe Baig J 11/13/2016, 11:18 AM

## 2016-11-13 NOTE — Anesthesia Postprocedure Evaluation (Signed)
Anesthesia Post Note  Patient: Rebecca Ford  Procedure(s) Performed: Procedure(s) (LRB): TOTAL KNEE ARTHROPLASTY (Right)  Patient location during evaluation: PACU Anesthesia Type: Spinal and Regional Level of consciousness: awake and alert, oriented and patient cooperative Pain management: pain level controlled Vital Signs Assessment: post-procedure vital signs reviewed and stable Respiratory status: spontaneous breathing, nonlabored ventilation and respiratory function stable Cardiovascular status: blood pressure returned to baseline and stable Postop Assessment: no headache, patient able to bend at knees, spinal receding and no signs of nausea or vomiting Anesthetic complications: no    Last Vitals:  Vitals:   11/13/16 1428 11/13/16 1434  BP: (!) 181/86 (!) 189/73  Pulse: (!) 56 (!) 56  Resp: 10 11  Temp:      Last Pain:  Vitals:   11/13/16 0812  TempSrc: Oral                 Gavynn Duvall,E. Klaira Pesci

## 2016-11-13 NOTE — Evaluation (Signed)
Physical Therapy Evaluation Patient Details Name: Rebecca Ford MRN: [id number] DOB: June 24, 1930 Today's Date: 11/13/2016   History of Present Illness  Patient is a 82 y/o female with hx of MI, HTN, HLD, dementia and CAD presents s/p Rt TKA.  Clinical Impression  Patient presents with post surgical deficits s/p above surgery. Tolerated gait training and transfers with Min A-min guard assist for safety. Pt Mod I PTA using rollator and RW but lives alone. Education re: knee precautions, exercises, positioning and zero degree knee. Would benefit from ST SNF to maximize independence and mobility prior to return home. Will follow acutely.     Follow Up Recommendations SNF    Equipment Recommendations  None recommended by PT    Recommendations for Other Services OT consult     Precautions / Restrictions Precautions Precautions: Knee Precaution Booklet Issued: No Precaution Comments: Reviewed no pillow under knee and precautions Restrictions Weight Bearing Restrictions: Yes RLE Weight Bearing: Weight bearing as tolerated      Mobility  Bed Mobility Overal bed mobility: Needs Assistance Bed Mobility: Supine to Sit     Supine to sit: Supervision;HOB elevated     General bed mobility comments: No assist needed. Use of rail for support. No dizziness.   Transfers Overall transfer level: Needs assistance Equipment used: Rolling walker (2 wheeled) Transfers: Sit to/from Stand Sit to Stand: Min guard         General transfer comment: Min guard for safety. Stood from [employer], from toilet x1. Transferred to chair post ambulation bout.  Ambulation/Gait Ambulation/Gait assistance: Min guard Ambulation Distance (Feet): 25 Feet Assistive device: Rolling walker (2 wheeled) Gait Pattern/deviations: Step-to pattern;Step-through pattern;Decreased stance time - right;Decreased step length - left;Trunk flexed Gait velocity: decreased   General Gait Details: Slow, mostly steady gait with  cues for RW management. Increased WB through Gunter. No knee instability noted.   Stairs            Wheelchair Mobility    Modified Rankin (Stroke Patients Only)       Balance Overall balance assessment: Needs assistance Sitting-balance support: Feet supported;No upper extremity supported Sitting balance-Leahy Scale: Good     Standing balance support: During functional activity Standing balance-Leahy Scale: Fair Standing balance comment: Able to wash hands at sink without UE support.                              Pertinent Vitals/Pain Pain Assessment: No/denies pain    Home Living Family/patient expects to be discharged to:: Skilled nursing facility Living Arrangements: Alone                    Prior Function Level of Independence: Independent with assistive device(s)         Comments: Uses rollator and RW for ambulation. Has someone come in to help with cleaning once/week. Does not cook much.     Hand Dominance   Dominant Hand: Right    Extremity/Trunk Assessment   Upper Extremity Assessment: Defer to OT evaluation           Lower Extremity Assessment: RLE deficits/detail RLE Deficits / Details: Able to perform LAQ and QS without difficulty.        Communication   Communication: No difficulties  Cognition Arousal/Alertness: Awake/alert Behavior During Therapy: WFL for tasks assessed/performed Overall Cognitive Status: No family/caregiver present to determine baseline cognitive functioning  General Comments      Exercises Total Joint Exercises Ankle Circles/Pumps: Both;10 reps;Supine Quad Sets: Both;10 reps;Seated Gluteal Sets: Both;10 reps;Seated   Assessment/Plan    PT Assessment Patient needs continued PT services  PT Problem List Decreased strength;Decreased mobility;Decreased range of motion;Decreased balance;Decreased activity tolerance;Decreased knowledge of use of DME          PT  Treatment Interventions DME instruction;Therapeutic activities;Gait training;Therapeutic exercise;Patient/family education;Balance training;Functional mobility training    PT Goals (Current goals can be found in the Care Plan section)  Acute Rehab PT Goals Patient Stated Goal: to be able to walk PT Goal Formulation: With patient Time For Goal Achievement: 11/27/16 Potential to Achieve Goals: Good    Frequency 7X/week   Barriers to discharge Decreased caregiver support lives alone    Co-evaluation               End of Session Equipment Utilized During Treatment: Gait belt Activity Tolerance: Patient tolerated treatment well Patient left: in chair;with call bell/phone within reach Nurse Communication: Mobility status         Time: [id number] PT Time Calculation (min) (ACUTE ONLY): 25 min   Charges:   PT Evaluation $PT Eval Low Complexity: 1 Procedure PT Treatments $Gait Training: 8-22 mins   PT G Codes:        Jaeleen Inzunza A Nirvaan Frett 11/13/2016, 4:41 PM Wray Kearns, [place], DPT [phone]

## 2016-11-13 NOTE — Progress Notes (Signed)
Orthopedic Tech Progress Note Patient Details:  Rebecca Ford 25-Jul-1930 [id number]  Ortho Devices Type of Ortho Device:  (Footsie Roll (Right Leg)) Ortho Device/Splint Location: Provided Bone Foam Zero Degree For Right Leg/ Knee   Kristopher Oppenheim 11/13/2016, 4:12 PM

## 2016-11-14 ENCOUNTER — Encounter (HOSPITAL_COMMUNITY): Payer: Self-pay | Admitting: Orthopedic Surgery

## 2016-11-14 LAB — CBC
HCT: 27.1 % — ABNORMAL LOW (ref 36.0–46.0)
HEMOGLOBIN: 9.2 g/dL — AB (ref 12.0–15.0)
MCH: 32.4 pg (ref 26.0–34.0)
MCHC: 33.9 g/dL (ref 30.0–36.0)
MCV: 95.4 fL (ref 78.0–100.0)
Platelets: 156 10*3/uL (ref 150–400)
RBC: 2.84 MIL/uL — ABNORMAL LOW (ref 3.87–5.11)
RDW: 12.1 % (ref 11.5–15.5)
WBC: 8.2 10*3/uL (ref 4.0–10.5)

## 2016-11-14 LAB — BASIC METABOLIC PANEL
Anion gap: 8 (ref 5–15)
BUN: 11 mg/dL (ref 6–20)
CHLORIDE: 96 mmol/L — AB (ref 101–111)
CO2: 29 mmol/L (ref 22–32)
CREATININE: 0.74 mg/dL (ref 0.44–1.00)
Calcium: 8.3 mg/dL — ABNORMAL LOW (ref 8.9–10.3)
GFR calc Af Amer: >60 mL/min (ref >60)
GFR calc non Af Amer: >60 mL/min (ref >60)
GLUCOSE: 126 mg/dL — AB (ref 65–99)
Potassium: 3.2 mmol/L — ABNORMAL LOW (ref 3.5–5.1)
SODIUM: 133 mmol/L — AB (ref 135–145)

## 2016-11-14 NOTE — Progress Notes (Signed)
PATIENT ID: Rebecca Ford  MRN: [id number]  DOB/AGE:  December 03, 1930 / 82 y.o.  1 Day Post-Op Procedure(s) (LRB): TOTAL KNEE ARTHROPLASTY (Right)    PROGRESS NOTE Subjective: Patient is alert, oriented, no Nausea, no Vomiting, yes passing gas. Taking PO well. Denies SOB, Chest or Calf Pain. Using Incentive Spirometer, PAS in place. Ambulate 19ft, no CPM Patient reports pain as 2/10 .    Objective: Vital signs in last 24 hours: Vitals:   11/13/16 1445 11/13/16 2022 11/13/16 2057 11/14/16 0434  BP:   (!) 170/61 (!) 144/62  Pulse:  71  76  Resp:  16  17  Temp: 97.8 F (36.6 C) 97.9 F (36.6 C)  98.2 F (36.8 C)  TempSrc:  Oral  Oral  SpO2:  100%  93%  Weight:          Intake/Output from previous day: I/O last 3 completed shifts: In: 1640 [P.O.:240; I.V.:1400] Out: 525 [Urine:500; Blood:25]   Intake/Output this shift: No intake/output data recorded.   LABORATORY DATA:  Recent Labs  11/13/16 0836 11/14/16 0505  WBC  --  8.2  HGB 9.9* 9.2*  HCT 29.0* 27.1*  PLT  --  156  NA 135 133*  K 3.3* 3.2*  CL  --  96*  CO2  --  29  BUN  --  11  CREATININE  --  0.74  GLUCOSE 101* 126*  CALCIUM  --  8.3*    Examination: Neurologically intact ABD soft Neurovascular intact Sensation intact distally Intact pulses distally Dorsiflexion/Plantar flexion intact Incision: dressing C/D/I No cellulitis present Compartment soft} Range of motion to manual testing 5/85 degrees  Assessment:   1 Day Post-Op Procedure(s) (LRB): TOTAL KNEE ARTHROPLASTY (Right) ADDITIONAL DIAGNOSIS: Expected Acute Blood Loss Anemia, Hypertension,History of coronary artery disease  Plan: PT/OT WBAT, No.  CPM DVT Prophylaxis:  SCDx72hrs, ASA 325 mg BID x 2 weeks DISCHARGE PLAN: Skilled Nursing Facility/Rehab,Patient lives by herself with little family support DISCHARGE NEEDS: HHPT, Walker and 3-in-1 comode seat     Rebecca Ford J 11/14/2016, 7:59 AM Patient ID: Rebecca Ford, female   DOB:  07/22/1930, 82 y.o.   MRN: [id number]

## 2016-11-14 NOTE — NC FL2 (Signed)
[place] LEVEL OF CARE SCREENING TOOL     IDENTIFICATION  Patient Name: Rebecca Ford Birthdate: Sep 28, 1930 Sex: female Admission Date (Current Location): 11/13/2016  [facility] and [place] Number:  Herbalist and Address:  [facility]. [facility], [place] [address] [place], [place] [address]      Provider Number: [id number]  Attending Physician Name and Address:  Frederik Pear, MD  Relative Name and Phone Number:       Current Level of Care: Hospital Recommended Level of Care: [place] Prior Approval Number:    Date Approved/Denied: 11/14/16 PASRR Number: [id number]    Discharge Plan: SNF    Current Diagnoses: Patient Active Problem List   Diagnosis Date Noted  . Localized osteoarthritis of right knee 11/13/2016  . Primary osteoarthritis of right knee [id number]  . Syncope 11/16/2015  . 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 HYPERTENSION, BENIGN 09/20/2010  . CAD, NATIVE VESSEL 09/20/2010    Orientation RESPIRATION BLADDER Height & Weight     Self, Time, Situation, Place  Normal Continent Weight: 140 lb (63.5 kg) Height:     BEHAVIORAL SYMPTOMS/MOOD NEUROLOGICAL BOWEL NUTRITION STATUS      Continent  (Please see discharge summary)  AMBULATORY STATUS COMMUNICATION OF NEEDS Skin   Limited Assist Verbally Surgical wounds (Closed incision right leg, compression wrap dressing.)                       Personal Care Assistance Level of Assistance  Bathing, Feeding, Dressing Bathing Assistance: Limited assistance Feeding assistance: Independent Dressing Assistance: Limited assistance     Functional Limitations Info  Sight, Hearing, Speech Sight Info: Adequate Hearing Info: Adequate Speech Info: Adequate    SPECIAL CARE FACTORS FREQUENCY  PT (By licensed PT), OT (By licensed OT)     PT Frequency: 7x week OT Frequency: 7x week            Contractures Contractures Info: Not present    Additional  Factors Info  Code Status, Allergies Code Status Info: Full Allergies Info: Codeine           Current Medications (11/14/2016):  This is the current hospital active medication list Current Facility-Administered Medications  Medication Dose Route Frequency Provider Last Rate Last Dose  . acetaminophen (TYLENOL) tablet 650 mg  650 mg Oral Q6H PRN Leighton Parody, PA-C   650 mg at 11/14/16 1356   Or  . acetaminophen (TYLENOL) suppository 650 mg  650 mg Rectal Q6H PRN Leighton Parody, PA-C      . alum & mag hydroxide-simeth (MAALOX/MYLANTA) 200-200-20 MG/5ML suspension 30 mL  30 mL Oral Q4H PRN Leighton Parody, PA-C      . amitriptyline (ELAVIL) tablet 100 mg  100 mg Oral QHS Leighton Parody, PA-C   100 mg at 11/13/16 2144  . amoxicillin (AMOXIL) capsule 500 mg  500 mg Oral Q12H Leighton Parody, PA-C   500 mg at 11/14/16 0847  . aspirin EC tablet 325 mg  325 mg Oral Q breakfast Leighton Parody, PA-C   325 mg at 11/14/16 0847  . bisacodyl (DULCOLAX) EC tablet 5 mg  5 mg Oral Daily PRN Leighton Parody, PA-C      . dextrose 5 % and 0.45 % NaCl with KCl 20 mEq/L infusion   Intravenous Continuous Leighton Parody, PA-C 125 mL/hr at 11/13/16 1614    . diphenhydrAMINE (BENADRYL) 12.5 MG/5ML elixir 12.5-25 mg  12.5-25 mg Oral Q4H PRN Leighton Parody, PA-C      . docusate sodium (COLACE) capsule 100 mg  100 mg Oral BID Leighton Parody, PA-C   100 mg at 11/14/16 [id number]  . furosemide (LASIX) tablet 20 mg  20 mg Oral Daily Leighton Parody, PA-C   20 mg at 11/14/16 0848  . hydrochlorothiazide (HYDRODIURIL) tablet 25 mg  25 mg Oral Daily Frederik Pear, MD   25 mg at 11/14/16 0848  . HYDROmorphone (DILAUDID) injection 1 mg  1 mg Intravenous Q2H PRN Leighton Parody, PA-C      . losartan (COZAAR) tablet 100 mg  100 mg Oral Daily Frederik Pear, MD   100 mg at 11/14/16 0848  . menthol-cetylpyridinium (CEPACOL) lozenge 3 mg  1 lozenge Oral PRN Leighton Parody, PA-C       Or  . phenol (CHLORASEPTIC) mouth spray 1 spray   1 spray Mouth/Throat PRN Leighton Parody, PA-C      . methocarbamol (ROBAXIN) tablet 500 mg  500 mg Oral Q6H PRN Leighton Parody, PA-C       Or  . methocarbamol (ROBAXIN) 500 mg in dextrose 5 % 50 mL IVPB  500 mg Intravenous Q6H PRN Leighton Parody, PA-C      . metoCLOPramide (REGLAN) tablet 5 mg  5 mg Oral Q8H PRN Leighton Parody, PA-C       Or  . metoCLOPramide (REGLAN) injection 5 mg  5 mg Intravenous Q8H PRN Leighton Parody, PA-C      . metoprolol (LOPRESSOR) tablet 50 mg  50 mg Oral BID Leighton Parody, PA-C   50 mg at 11/14/16 0847  . ondansetron (ZOFRAN) tablet 4 mg  4 mg Oral Q6H PRN Leighton Parody, PA-C       Or  . ondansetron [facility]) injection 4 mg  4 mg Intravenous Q6H PRN Leighton Parody, PA-C      . oxyCODONE (Oxy IR/ROXICODONE) immediate release tablet 5-10 mg  5-10 mg Oral Q3H PRN Leighton Parody, PA-C   10 mg at 11/14/16 1356  . potassium chloride SA (K-DUR,KLOR-CON) CR tablet 20 mEq  20 mEq Oral Daily Leighton Parody, PA-C   20 mEq at 11/14/16 0847  . senna-docusate (Senokot-S) tablet 1 tablet  1 tablet Oral QHS PRN Leighton Parody, PA-C      . sodium phosphate (FLEET) 7-19 GM/118ML enema 1 enema  1 enema Rectal Once PRN Leighton Parody, PA-C      . traMADol Veatrice Bourbon) tablet 50 mg  50 mg Oral Q12H PRN Leighton Parody, PA-C   50 mg at 11/14/16 [id number]     Discharge Medications: Please see discharge summary for a list of discharge medications.  Relevant Imaging Results:  Relevant Lab Results:   Additional Information SSN: [id number]  Alla German, LCSW

## 2016-11-14 NOTE — Plan of Care (Signed)
Problem: Pain Management: Goal: Pain level will decrease with appropriate interventions Outcome: Progressing Pain well managed, medicated once for pain with full relief, resting in the bed and sleeping at moment  Problem: Activity: Goal: Range of joint motion will improve Outcome: Progressing Tolerated CPM well Goal: Will remain free from falls Outcome: Progressing Fall preventions and safety precautions mainatined  Problem: Education: Goal: Knowledge of the prescribed therapeutic regimen will improve Outcome: Progressing No s/s of dvt noted  Problem: Physical Regulation: Goal: Postoperative complications will be avoided or minimized Outcome: Progressing No post op complications noted

## 2016-11-14 NOTE — Progress Notes (Signed)
Physical Therapy Treatment Patient Details Name: RUSTI HARDEN MRN: [id number] DOB: 1930-10-07 Today's Date: 11/14/2016    History of Present Illness Patient is a 82 y/o female with hx of MI, HTN, HLD, dementia and CAD presents s/p Rt TKA.    PT Comments    Patient is progressing toward mobility goals. Continue to recommend SNF for further skilled PT services to maximize independence and safety with mobility prior to d/c home alone.   Follow Up Recommendations  SNF     Equipment Recommendations  None recommended by PT    Recommendations for Other Services OT consult     Precautions / Restrictions Precautions Precautions: Knee Precaution Comments: Reviewed no pillow under knee and precautions Restrictions Weight Bearing Restrictions: Yes RLE Weight Bearing: Weight bearing as tolerated    Mobility  Bed Mobility Overal bed mobility: Needs Assistance Bed Mobility: Supine to Sit     Supine to sit: Supervision;HOB elevated     General bed mobility comments: supervision for safety; use of rails  Transfers Overall transfer level: Needs assistance Equipment used: Rolling walker (2 wheeled) Transfers: Sit to/from Stand Sit to Stand: Min guard         General transfer comment: cues for safe hand placement and technique to safely descend onto recliner; stood from EOB and BSC  Ambulation/Gait Ambulation/Gait assistance: Min guard Ambulation Distance (Feet): 70 Feet Assistive device: Rolling walker (2 wheeled) Gait Pattern/deviations: Step-through pattern;Decreased stance time - right;Decreased step length - left;Decreased weight shift to right;Trunk flexed Gait velocity: decreased   General Gait Details: cues for posture, sequencing, and safe use of AD; pt with slow, steady gait   Stairs            Wheelchair Mobility    Modified Rankin (Stroke Patients Only)       Balance                                    Cognition Arousal/Alertness:  Awake/alert Behavior During Therapy: WFL for tasks assessed/performed Overall Cognitive Status: Within Functional Limits for tasks assessed                      Exercises Total Joint Exercises Quad Sets: AROM;Both;10 reps Heel Slides: AROM;Right;10 reps Hip ABduction/ADduction: AROM;Right;10 reps Straight Leg Raises: AROM;Right;10 reps Goniometric ROM: 75 degrees of flexion    General Comments        Pertinent Vitals/Pain Pain Assessment: Faces Faces Pain Scale: Hurts little more Pain Location: R knee Pain Descriptors / Indicators: Aching;Grimacing Pain Intervention(s): Limited activity within patient's tolerance;Monitored during session;Premedicated before session;Repositioned;Ice applied    Home Living                      Prior Function            PT Goals (current goals can now be found in the care plan section) Acute Rehab PT Goals Patient Stated Goal: to be able to walk Progress towards PT goals: Progressing toward goals    Frequency    7X/week      PT Plan Current plan remains appropriate    Co-evaluation             End of Session Equipment Utilized During Treatment: Gait belt Activity Tolerance: Patient tolerated treatment well Patient left: in chair;with call bell/phone within reach     Time: [id number] PT Time Calculation (min) (ACUTE ONLY):  31 min  Charges:  $Gait Training: 8-22 mins $Therapeutic Exercise: 8-22 mins                    G Codes:      Salina April, PTA Pager: [phone]   11/14/2016, 11:46 AM

## 2016-11-14 NOTE — Clinical Social Work Note (Signed)
Clinical Social Work Assessment  Patient Details  Name: Rebecca Ford MRN: [id number] Date of Birth: 27-Mar-1930  Date of referral:  11/14/16               Reason for consult:  Facility Placement                Permission sought to share information with:  Family Supports Permission granted to share information::  Yes, Verbal Permission Granted  Name::     Abigail Butts  Agency::     Relationship::  God daughter  Contact Information:  [phone]  Housing/Transportation Living arrangements for the past 2 months:  [place] of Information:  Patient Patient Interpreter Needed:  None Criminal Activity/Legal Involvement Pertinent to Current Situation/Hospitalization:  No - Comment as needed Significant Relationships:  Other Family Members Lives with:  Self Do you feel safe going back to the place where you live?  Yes Need for family participation in patient care:  Yes (Comment)  Care giving concerns:  No family or friends at bedside at this time. Pt granted CSW verbal permission to contact God daughter, contact information in the chart.   Social Worker assessment / plan:  CSW spoke with pt at bedside. [name] agreeable to SNF placement at this time. Pt does not know anything about the facilities. Pt agreeable to CSW sending referral to [place] facilities. CSW will continue to facilitate SNF placement.  Employment status:  Retired Forensic scientist:  Medicare PT Recommendations:  [place] / Referral to community resources:  [place]  Patient/Family's Response to care:  Pt verbalized understanding of CSW role and appreciation of support. Pt denies any concern regarding pt care.  Patient/Family's Understanding of and Emotional Response to Diagnosis, Current Treatment, and Prognosis:  Pt understanding and realistic regarding physical limitations. Pt agreeable to SNF placement at this time. Pt denies any concern regarding  treatment plan.  Emotional Assessment Appearance:  Appears stated age Attitude/Demeanor/Rapport:   (Patient is appropriate.) Affect (typically observed):  Accepting, Appropriate, Pleasant Orientation:  Oriented to Self, Oriented to Place, Oriented to  Time, Oriented to Situation Alcohol / Substance use:  Not Applicable Psych involvement (Current and /or in the community):  No (Comment)  Discharge Needs  Concerns to be addressed:  Care Coordination Readmission within the last 30 days:  No Current discharge risk:  Dependent with Mobility, Lack of support system Barriers to Discharge:  Continued Medical Work up   [employer], LCSW 11/14/2016, 4:26 PM

## 2016-11-14 NOTE — Progress Notes (Signed)
OT Cancellation Note  Patient Details Name: SERIAH DEGIDIO MRN: [id number] DOB: 1930-01-20   Cancelled Treatment:    Reason Eval/Treat Not Completed: Other (comment) Pt's current D/C plan is SNF. No apparent immediate acute care OT needs, therefore will defer OT to SNF. If OT eval is needed please call Acute Rehab Dept. at [phone] or text page OT at [phone].   [place], OTR/L  [id number] 11/14/2016 11/14/2016, 7:42 AM

## 2016-11-15 LAB — CBC
HEMATOCRIT: 28.3 % — AB (ref 36.0–46.0)
HEMOGLOBIN: 9.6 g/dL — AB (ref 12.0–15.0)
MCH: 32.5 pg (ref 26.0–34.0)
MCHC: 33.9 g/dL (ref 30.0–36.0)
MCV: 95.9 fL (ref 78.0–100.0)
Platelets: 142 10*3/uL — ABNORMAL LOW (ref 150–400)
RBC: 2.95 MIL/uL — ABNORMAL LOW (ref 3.87–5.11)
RDW: 12.5 % (ref 11.5–15.5)
WBC: 8.7 10*3/uL (ref 4.0–10.5)

## 2016-11-15 NOTE — Progress Notes (Addendum)
PATIENT ID: Rebecca Ford  MRN: [id number]  DOB/AGE:  1930-12-03 / 82 y.o.  2 Days Post-Op Procedure(s) (LRB): TOTAL KNEE ARTHROPLASTY (Right)    PROGRESS NOTE Subjective: Patient is alert, oriented, no Nausea, no Vomiting, yes passing gas. Taking PO well. Denies SOB, Chest or Calf Pain. Using Incentive Spirometer, PAS in place. Ambulate WBAT with pt walking 70 ft, no CPM  Patient reports pain as mild at rest .    Objective: Vital signs in last 24 hours: Vitals:   11/14/16 1300 11/14/16 1600 11/14/16 2033 11/15/16 0414  BP: (!) 124/55  (!) 167/54 (!) 188/80  Pulse: 74  77 82  Resp: 16  18 16   Temp: (!) 101.5 F (38.6 C) 98.8 F (37.1 C) 98.8 F (37.1 C) 100.3 F (37.9 C)  TempSrc: Oral Oral Oral Oral  SpO2: 94%  96% 93%  Weight:          Intake/Output from previous day: I/O last 3 completed shifts: In: 2909.2 [P.O.:480; I.V.:2429.2] Out: -    Intake/Output this shift: No intake/output data recorded.   LABORATORY DATA:  Recent Labs  11/13/16 0836 11/14/16 0505 11/15/16 0502  WBC  --  8.2 8.7  HGB 9.9* 9.2* 9.6*  HCT 29.0* 27.1* 28.3*  PLT  --  156 142*  NA 135 133*  --   K 3.3* 3.2*  --   CL  --  96*  --   CO2  --  29  --   BUN  --  11  --   CREATININE  --  0.74  --   GLUCOSE 101* 126*  --   CALCIUM  --  8.3*  --     Examination: Neurologically intact Neurovascular intact Sensation intact distally Intact pulses distally Dorsiflexion/Plantar flexion intact Incision: dressing C/D/I No cellulitis present Compartment soft}  Assessment:   2 Days Post-Op Procedure(s) (LRB): TOTAL KNEE ARTHROPLASTY (Right) ADDITIONAL DIAGNOSIS: Expected Acute Blood Loss Anemia, Hypertension  Plan: PT/OT WBAT, no CPM  DVT Prophylaxis:  SCDx72hrs, ASA 325 mg BID x 2 weeks DISCHARGE PLAN: Skilled Nursing Facility/Rehab DISCHARGE NEEDS: HHPT, Walker and 3-in-1 comode seat     Gilda Abboud R 11/15/2016, 7:22 AM

## 2016-11-15 NOTE — Clinical Social Work Note (Addendum)
CSW spoke with pt and pt's daughter. Pt has chosen [facility] at this time. CSW spoke with Ivin Booty at [facility] and she will have a bed available for pt tomorrow. CSW called MD office and left a message. CSW has spoken with Sommar with [employer] and insurance will be authorized for placement on 12/7 and it has been started at this time. CSW will facilitate d/c to SNF tomorrow, 12/7.  [address], [place]

## 2016-11-15 NOTE — Discharge Summary (Deleted)
Patient ID: Rebecca Ford MRN: [id number] DOB/AGE: 16-Nov-1930 82 y.o.  Admit date: 11/13/2016 Discharge date: 11/15/2016  Admission Diagnoses:  Principal Problem:   Primary osteoarthritis of right knee Active Problems:   Localized osteoarthritis of right knee   Discharge Diagnoses:  Same  Past Medical History:  Diagnosis Date  . Arthritis   . CAD (coronary artery disease)    s/p cath 09/07/10 w/3 vessel CAD, DES in RCA, Diagonal & LAD  . Dementia   . HTN (hypertension)   . Hyperlipemia   . MI (myocardial infarction)    Non-ST elevation 9/11    Surgeries: Procedure(s): TOTAL KNEE ARTHROPLASTY on 11/13/2016   Consultants:   Discharged Condition: Improved  Hospital Course: Rebecca Ford is an 82 y.o. female who was admitted 11/13/2016 for operative treatment ofPrimary osteoarthritis of right knee. Patient has severe unremitting pain that affects sleep, daily activities, and work/hobbies. After pre-op clearance the patient was taken to the operating room on 11/13/2016 and underwent  Procedure(s): TOTAL KNEE ARTHROPLASTY.    Patient was given perioperative antibiotics: Anti-infectives    Start     Dose/Rate Route Frequency Ordered Stop   11/13/16 2200  amoxicillin (AMOXIL) capsule 500 mg     500 mg Oral Every 12 hours 11/13/16 1542     11/13/16 1105  cefUROXime (ZINACEF) injection  Status:  Discontinued       As needed 11/13/16 1106 11/13/16 1147   11/13/16 0824  ceFAZolin (ANCEF) 2-4 GM/100ML-% IVPB    Comments:  Henrine Screws   : cabinet override      11/13/16 0824 11/13/16 1012   11/13/16 0821  ceFAZolin (ANCEF) IVPB 2g/100 mL premix     2 g 200 mL/hr over 30 Minutes Intravenous On call to O.R. 11/13/16 0821 11/13/16 1012       Patient was given sequential compression devices, early ambulation, and chemoprophylaxis to prevent DVT.  Patient benefited maximally from hospital stay and there were no complications.    Recent vital signs: Patient Vitals for the past 24  hrs:  BP Temp Temp src Pulse Resp SpO2  11/15/16 0414 (!) 188/80 100.3 F (37.9 C) Oral 82 16 93 %  11/14/16 2033 (!) 167/54 98.8 F (37.1 C) Oral 77 18 96 %  11/14/16 1600 - 98.8 F (37.1 C) Oral - - -  11/14/16 1300 (!) 124/55 (!) 101.5 F (38.6 C) Oral 74 16 94 %     Recent laboratory studies:  Recent Labs  11/13/16 0836 11/14/16 0505 11/15/16 0502  WBC  --  8.2 8.7  HGB 9.9* 9.2* 9.6*  HCT 29.0* 27.1* 28.3*  PLT  --  156 142*  NA 135 133*  --   K 3.3* 3.2*  --   CL  --  96*  --   CO2  --  29  --   BUN  --  11  --   CREATININE  --  0.74  --   GLUCOSE 101* 126*  --   CALCIUM  --  8.3*  --      Discharge Medications:     Medication List    STOP taking these medications   aspirin 81 MG tablet Replaced by:  aspirin EC 325 MG tablet   naproxen 500 MG tablet Commonly known as:  NAPROSYN     TAKE these medications   amitriptyline 100 MG tablet Commonly known as:  ELAVIL Take 100 mg by mouth at bedtime.   amLODipine 5 MG tablet Commonly known as:  NORVASC TAKE 1 TABLET BY MOUTH EVERY DAY   amoxicillin 500 MG capsule Commonly known as:  AMOXIL Take 500 mg by mouth every 12 (twelve) hours. For 7 days   aspirin EC 325 MG tablet Take 1 tablet (325 mg total) by mouth 2 (two) times daily. Replaces:  aspirin 81 MG tablet   atorvastatin 80 MG tablet Commonly known as:  LIPITOR TAKE 1 TABLET BY MOUTH EVERY DAY What changed:  how much to take  how to take this  when to take this  additional instructions   fish oil-omega-3 fatty acids 1000 MG capsule Take 1 g by mouth 2 (two) times daily.   furosemide 20 MG tablet Commonly known as:  LASIX Take 20 mg by mouth daily.   losartan-hydrochlorothiazide 100-25 MG tablet Commonly known as:  HYZAAR Take 1 tablet by mouth every morning.   metoprolol 50 MG tablet Commonly known as:  LOPRESSOR Take 50 mg by mouth 2 (two) times daily.   nitroGLYCERIN 0.4 MG SL tablet Commonly known as:  NITROSTAT Place 1  tablet (0.4 mg total) under the tongue every 5 (five) minutes as needed for chest pain.   oxyCODONE-acetaminophen 5-325 MG tablet Commonly known as:  ROXICET Take 1 tablet by mouth every 4 (four) hours as needed.   potassium chloride SA 20 MEQ tablet Commonly known as:  K-DUR,KLOR-CON Take 20 mEq by mouth daily.   PRESERVISION AREDS PO Take 1 tablet by mouth 2 (two) times daily.   tiZANidine 2 MG tablet Commonly known as:  ZANAFLEX Take 1 tablet (2 mg total) by mouth every 6 (six) hours as needed for muscle spasms.   traMADol 50 MG tablet Commonly known as:  ULTRAM Take 1 tablet (50 mg total) by mouth every 12 (twelve) hours as needed. What changed:  reasons to take this   VITAMIN D-3 PO Take 1 tablet by mouth every morning.            Durable Medical Equipment        Start     Ordered   11/13/16 1542  DME Walker rolling  Once    Question:  Patient needs a walker to treat with the following condition  Answer:  Localized osteoarthritis of right knee   11/13/16 1542   11/13/16 1542  DME 3 n 1  Once     11/13/16 1542   11/13/16 1542  DME Bedside commode  Once    Question:  Patient needs a bedside commode to treat with the following condition  Answer:  Localized osteoarthritis of right knee   11/13/16 1542      Diagnostic Studies: Dg Chest 2 View  Result Date: 10/31/2016 CLINICAL DATA:  Preop for total knee arthroplasty. History of hypertension. EXAM: CHEST  2 VIEW COMPARISON:  09/07/2010 FINDINGS: Normal heart size and pulmonary vascularity. Nodular prominence in the right cardiophrenic angle is unchanged since prior study, likely representing cyst or lipoma. Lungs are clear. No blunting of costophrenic angles. No pneumothorax. Degenerative changes in the spine and shoulders. IMPRESSION: No active cardiopulmonary disease. Electronically Signed   By: Lucienne Capers M.D.   On: 10/31/2016 23:39    Disposition: 01-Home or Self Care  Discharge Instructions    Call MD  / Call 911    Complete by:  As directed    If you experience chest pain or shortness of breath, CALL 911 and be transported to the hospital emergency room.  If you develope a fever above 101 F, pus (white drainage)  or increased drainage or redness at the wound, or calf pain, call your surgeon's office.   Change dressing    Complete by:  As directed    Change dressing on 5, then change the dressing daily with sterile 4 x 4 inch gauze dressing and apply TED hose.  You may clean the incision with alcohol prior to redressing.   Constipation Prevention    Complete by:  As directed    Drink plenty of fluids.  Prune juice may be helpful.  You may use a stool softener, such as Colace (over the counter) 100 mg twice a day.  Use MiraLax (over the counter) for constipation as needed.   Diet - low sodium heart healthy    Complete by:  As directed    Driving restrictions    Complete by:  As directed    No driving for 2 weeks   Increase activity slowly as tolerated    Complete by:  As directed    Patient may shower    Complete by:  As directed    You may shower without a dressing once there is no drainage.  Do not wash over the wound.  If drainage remains, cover wound with plastic wrap and then shower.      Follow-up Information    Kerin Salen, MD Follow up in 2 week(s).   Specialty:  Orthopedic Surgery Contact information: [place] [address] [phone]            Signed: Hardin Negus Doneshia Hill R 11/15/2016, 7:25 AM

## 2016-11-15 NOTE — Progress Notes (Signed)
Physical Therapy Treatment Patient Details Name: Rebecca Ford MRN: [id number] DOB: 1930/11/26 Today's Date: 11/15/2016    History of Present Illness Patient is a 82 y/o female with hx of MI, HTN, HLD, dementia and CAD presents s/p Rt TKA.    PT Comments    Patient tolerated gait this session but limited by lethargy (?medications). Family present end of session. Continue to progress as tolerated with anticipated d/c to SNF for further skilled PT services.    Follow Up Recommendations  SNF     Equipment Recommendations  None recommended by PT    Recommendations for Other Services OT consult     Precautions / Restrictions Precautions Precautions: Knee Precaution Comments: Reviewed no pillow under knee and precautions Restrictions Weight Bearing Restrictions: Yes RLE Weight Bearing: Weight bearing as tolerated    Mobility  Bed Mobility               General bed mobility comments: pt received standing in bathroom with NT  Transfers Overall transfer level: Needs assistance Equipment used: Rolling walker (2 wheeled) Transfers: Sit to/from Stand Sit to Stand: Min assist         General transfer comment: pt began sitting prematurely leaving RW far away; min A and max cues for sequencing, technique, and safe use of AD  Ambulation/Gait Ambulation/Gait assistance: Min guard Ambulation Distance (Feet): 75 Feet Assistive device: Rolling walker (2 wheeled) Gait Pattern/deviations: Step-through pattern;Decreased stride length;Antalgic;Trunk flexed Gait velocity: decreased   General Gait Details: cues for sequencing, forward gaze, and proximity of RW   Stairs            Wheelchair Mobility    Modified Rankin (Stroke Patients Only)       Balance                                    Cognition Arousal/Alertness: Lethargic Behavior During Therapy: WFL for tasks assessed/performed Overall Cognitive Status: Impaired/Different from  baseline Area of Impairment: Problem solving             Problem Solving: Slow processing;Requires verbal cues      Exercises      General Comments General comments (skin integrity, edema, etc.): family present end of session      Pertinent Vitals/Pain Pain Assessment: 0-10 Pain Score: 5  Pain Location: R knee Pain Descriptors / Indicators: Aching Pain Intervention(s): Limited activity within patient's tolerance;Monitored during session;Patient requesting pain meds-RN notified    Home Living                      Prior Function            PT Goals (current goals can now be found in the care plan section) Acute Rehab PT Goals Patient Stated Goal: to be able to walk Progress towards PT goals: Progressing toward goals    Frequency    7X/week      PT Plan Current plan remains appropriate    Co-evaluation             End of Session Equipment Utilized During Treatment: Gait belt Activity Tolerance: Patient limited by lethargy Patient left: in chair;with call bell/phone within reach;with family/visitor present     Time: 1152-1210 PT Time Calculation (min) (ACUTE ONLY): 18 min  Charges:  $Gait Training: 8-22 mins  G Codes:      Salina April, PTA Pager: [phone]   11/15/2016, 3:12 PM

## 2016-11-16 DIAGNOSIS — E876 Hypokalemia: Secondary | ICD-10-CM | POA: Diagnosis not present

## 2016-11-16 DIAGNOSIS — E785 Hyperlipidemia, unspecified: Secondary | ICD-10-CM | POA: Diagnosis not present

## 2016-11-16 DIAGNOSIS — M1990 Unspecified osteoarthritis, unspecified site: Secondary | ICD-10-CM | POA: Diagnosis not present

## 2016-11-16 DIAGNOSIS — R2689 Other abnormalities of gait and mobility: Secondary | ICD-10-CM | POA: Diagnosis not present

## 2016-11-16 DIAGNOSIS — M1711 Unilateral primary osteoarthritis, right knee: Secondary | ICD-10-CM | POA: Diagnosis not present

## 2016-11-16 DIAGNOSIS — Z96651 Presence of right artificial knee joint: Secondary | ICD-10-CM | POA: Diagnosis not present

## 2016-11-16 DIAGNOSIS — I10 Essential (primary) hypertension: Secondary | ICD-10-CM | POA: Diagnosis not present

## 2016-11-16 DIAGNOSIS — R531 Weakness: Secondary | ICD-10-CM | POA: Diagnosis not present

## 2016-11-16 DIAGNOSIS — E871 Hypo-osmolality and hyponatremia: Secondary | ICD-10-CM | POA: Diagnosis not present

## 2016-11-16 DIAGNOSIS — M25561 Pain in right knee: Secondary | ICD-10-CM | POA: Diagnosis not present

## 2016-11-16 DIAGNOSIS — Z471 Aftercare following joint replacement surgery: Secondary | ICD-10-CM | POA: Diagnosis not present

## 2016-11-16 DIAGNOSIS — I2510 Atherosclerotic heart disease of native coronary artery without angina pectoris: Secondary | ICD-10-CM | POA: Diagnosis not present

## 2016-11-16 DIAGNOSIS — D62 Acute posthemorrhagic anemia: Secondary | ICD-10-CM | POA: Diagnosis not present

## 2016-11-16 DIAGNOSIS — R278 Other lack of coordination: Secondary | ICD-10-CM | POA: Diagnosis not present

## 2016-11-16 DIAGNOSIS — K5901 Slow transit constipation: Secondary | ICD-10-CM | POA: Diagnosis not present

## 2016-11-16 DIAGNOSIS — F329 Major depressive disorder, single episode, unspecified: Secondary | ICD-10-CM | POA: Diagnosis not present

## 2016-11-16 DIAGNOSIS — R2681 Unsteadiness on feet: Secondary | ICD-10-CM | POA: Diagnosis not present

## 2016-11-16 LAB — CBC
HCT: 24.7 % — ABNORMAL LOW (ref 36.0–46.0)
HEMOGLOBIN: 8.5 g/dL — AB (ref 12.0–15.0)
MCH: 32.4 pg (ref 26.0–34.0)
MCHC: 34.4 g/dL (ref 30.0–36.0)
MCV: 94.3 fL (ref 78.0–100.0)
Platelets: 176 10*3/uL (ref 150–400)
RBC: 2.62 MIL/uL — ABNORMAL LOW (ref 3.87–5.11)
RDW: 12.4 % (ref 11.5–15.5)
WBC: 8.6 10*3/uL (ref 4.0–10.5)

## 2016-11-16 NOTE — Clinical Social Work Note (Signed)
Clinical Social Worker facilitated patient discharge including contacting patient family and facility to confirm patient discharge plans.  Clinical information faxed to facility and family agreeable with plan.  CSW arranged ambulance transport via [facility] to [facility] .  RN to call [phone] for  report prior to discharge.  Clinical Social Worker will sign off for now as social work intervention is no longer needed. Please consult [place] again if new need arises.  [address] [place]

## 2016-11-16 NOTE — Clinical Social Work Placement (Signed)
   CLINICAL SOCIAL WORK PLACEMENT  NOTE  Date:  11/16/2016  Patient Details  Name: LISET MCALEESE MRN: [id number] Date of Birth: [birth year]  Clinical Social Work is seeking post-discharge placement for this patient at the [place] level of care (*CSW will initial, date and re-position this form in  chart as items are completed):      Patient/family provided with [facility] Work Department's list of facilities offering this level of care within the geographic area requested by the patient (or if unable, by the patient's family).  Yes   Patient/family informed of their freedom to choose among providers that offer the needed level of care, that participate in Medicare, Medicaid or managed care program needed by the patient, have an available bed and are willing to accept the patient.      Patient/family informed of [facility]'s ownership interest in [facility] and [facility], as well as of the fact that they are under no obligation to receive care at these facilities.  PASRR submitted to EDS on       PASRR number received on 11/14/16     Existing PASRR number confirmed on       FL2 transmitted to all facilities in geographic area requested by pt/family on 11/14/16     FL2 transmitted to all facilities within larger geographic area on       Patient informed that his/her managed care company has contracts with or will negotiate with certain facilities, including the following:        Yes   Patient/family informed of bed offers received.  Patient chooses bed at [facility]     Physician recommends and patient chooses bed at      Patient to be transferred to [facility] on 11/16/16.  Patient to be transferred to facility by [facility]     Patient family notified on 11/16/16 of transfer.  Name of family member notified:  Abigail Butts     PHYSICIAN       Additional Comment:    _______________________________________________ Alla German,  LCSW 11/16/2016, 1:07 PM

## 2016-11-16 NOTE — Progress Notes (Signed)
PATIENT ID: Rebecca Ford  MRN: [id number]  DOB/AGE:  November 09, 1930 / 82 y.o.  3 Days Post-Op Procedure(s) (LRB): TOTAL KNEE ARTHROPLASTY (Right)    PROGRESS NOTE Subjective: Patient is alert, oriented, no Nausea, no Vomiting, yes passing gas. Taking PO well. Denies SOB, Chest or Calf Pain. Using Incentive Spirometer, PAS in place. Ambulate 73ft Patient reports pain as 2/10 .    Objective: Vital signs in last 24 hours: Vitals:   11/15/16 1452 11/15/16 2052 11/15/16 2320 11/16/16 0658  BP: (!) 141/60 (!) 171/67  (!) 142/50  Pulse: 81 91  73  Resp:  16  16  Temp: 98.7 F (37.1 C) (!) 101.1 F (38.4 C) 98.2 F (36.8 C) 98.3 F (36.8 C)  TempSrc: Oral Oral Oral Oral  SpO2: 96% 94%  96%  Weight:          Intake/Output from previous day: No intake/output data recorded.   Intake/Output this shift: No intake/output data recorded.   LABORATORY DATA:  Recent Labs  11/13/16 0836  11/14/16 0505 11/15/16 0502 11/16/16 0439  WBC  --   < > 8.2 8.7 8.6  HGB 9.9*  --  9.2* 9.6* 8.5*  HCT 29.0*  --  27.1* 28.3* 24.7*  PLT  --   < > 156 142* 176  NA 135  --  133*  --   --   K 3.3*  --  3.2*  --   --   CL  --   --  96*  --   --   CO2  --   --  29  --   --   BUN  --   --  11  --   --   CREATININE  --   --  0.74  --   --   GLUCOSE 101*  --  126*  --   --   CALCIUM  --   --  8.3*  --   --   < > = values in this interval not displayed.  Examination: Neurologically intact ABD soft Neurovascular intact Sensation intact distally Intact pulses distally Dorsiflexion/Plantar flexion intact Incision: dressing C/D/I No cellulitis present Compartment soft}  Assessment:   3 Days Post-Op Procedure(s) (LRB): TOTAL KNEE ARTHROPLASTY (Right) ADDITIONAL DIAGNOSIS: Expected Acute Blood Loss Anemia, Hypertension, hx CAD, hx syncope  Plan: PT/OT WBAT, CPM 5/hrs day until ROM 0-90 degrees, then D/C CPM DVT Prophylaxis:  SCDx72hrs, ASA 325 mg BID x 2 weeks DISCHARGE PLAN: Skilled Nursing  Facility/Rehab, [facility] today DISCHARGE NEEDS: HHPT, Walker and 3-in-1 comode seat     Rebecca Ford 11/16/2016, 8:15 AM Patient ID: Rebecca Ford, female   DOB: 03-28-1930, 82 y.o.   MRN: [id number]

## 2016-11-16 NOTE — Clinical Social Work Note (Signed)
CSW has received insurance authorization at this time and this information has been provided to [facility] [place]. Authorization number: [id number].  [address] [place]

## 2016-11-16 NOTE — Discharge Summary (Signed)
Patient ID: Rebecca Ford MRN: [id number] DOB/AGE: August 22, 1930 82 y.o.  Admit date: 11/13/2016 Discharge date: 11/16/2016  Admission Diagnoses:  Principal Problem:   Primary osteoarthritis of right knee Active Problems:   Localized osteoarthritis of right knee   Discharge Diagnoses:  Same  Past Medical History:  Diagnosis Date  . Arthritis   . CAD (coronary artery disease)    s/p cath 09/07/10 w/3 vessel CAD, DES in RCA, Diagonal & LAD  . Dementia   . HTN (hypertension)   . Hyperlipemia   . MI (myocardial infarction)    Non-ST elevation 9/11    Surgeries: Procedure(s): TOTAL KNEE ARTHROPLASTY on 11/13/2016   Consultants:   Discharged Condition: Improved  Hospital Course: Rebecca Ford is an 82 y.o. female who was admitted 11/13/2016 for operative treatment ofPrimary osteoarthritis of right knee. Patient has severe unremitting pain that affects sleep, daily activities, and work/hobbies. After pre-op clearance the patient was taken to the operating room on 11/13/2016 and underwent  Procedure(s): TOTAL KNEE ARTHROPLASTY.    Patient was given perioperative antibiotics: Anti-infectives    Start     Dose/Rate Route Frequency Ordered Stop   11/13/16 2200  amoxicillin (AMOXIL) capsule 500 mg     500 mg Oral Every 12 hours 11/13/16 1542     11/13/16 1105  cefUROXime (ZINACEF) injection  Status:  Discontinued       As needed 11/13/16 1106 11/13/16 1147   11/13/16 0824  ceFAZolin (ANCEF) 2-4 GM/100ML-% IVPB    Comments:  Rebecca Ford   : cabinet override      11/13/16 0824 11/13/16 1012   11/13/16 0821  ceFAZolin (ANCEF) IVPB 2g/100 mL premix     2 g 200 mL/hr over 30 Minutes Intravenous On call to O.R. 11/13/16 0821 11/13/16 1012       Patient was given sequential compression devices, early ambulation, and chemoprophylaxis to prevent DVT.  Patient benefited maximally from hospital stay and there were no complications.    Recent vital signs: Patient Vitals for the past 24  hrs:  BP Temp Temp src Pulse Resp SpO2  11/16/16 1019 (!) 143/59 - - 88 16 99 %  11/16/16 0658 (!) 142/50 98.3 F (36.8 C) Oral 73 16 96 %  11/15/16 2320 - 98.2 F (36.8 C) Oral - - -  11/15/16 2052 (!) 171/67 (!) 101.1 F (38.4 C) Oral 91 16 94 %  11/15/16 1452 (!) 141/60 98.7 F (37.1 C) Oral 81 - 96 %     Recent laboratory studies:  Recent Labs  11/14/16 0505 11/15/16 0502 11/16/16 0439  WBC 8.2 8.7 8.6  HGB 9.2* 9.6* 8.5*  HCT 27.1* 28.3* 24.7*  PLT 156 142* 176  NA 133*  --   --   K 3.2*  --   --   CL 96*  --   --   CO2 29  --   --   BUN 11  --   --   CREATININE 0.74  --   --   GLUCOSE 126*  --   --   CALCIUM 8.3*  --   --      Discharge Medications:     Medication List    STOP taking these medications   aspirin 81 MG tablet Replaced by:  aspirin EC 325 MG tablet   naproxen 500 MG tablet Commonly known as:  NAPROSYN     TAKE these medications   amitriptyline 100 MG tablet Commonly known as:  ELAVIL Take 100 mg  by mouth at bedtime.   amLODipine 5 MG tablet Commonly known as:  NORVASC TAKE 1 TABLET BY MOUTH EVERY DAY   amoxicillin 500 MG capsule Commonly known as:  AMOXIL Take 500 mg by mouth every 12 (twelve) hours. For 7 days   aspirin EC 325 MG tablet Take 1 tablet (325 mg total) by mouth 2 (two) times daily. Replaces:  aspirin 81 MG tablet   atorvastatin 80 MG tablet Commonly known as:  LIPITOR TAKE 1 TABLET BY MOUTH EVERY DAY What changed:  how much to take  how to take this  when to take this  additional instructions   fish oil-omega-3 fatty acids 1000 MG capsule Take 1 g by mouth 2 (two) times daily.   furosemide 20 MG tablet Commonly known as:  LASIX Take 20 mg by mouth daily.   losartan-hydrochlorothiazide 100-25 MG tablet Commonly known as:  HYZAAR Take 1 tablet by mouth every morning.   metoprolol 50 MG tablet Commonly known as:  LOPRESSOR Take 50 mg by mouth 2 (two) times daily.   nitroGLYCERIN 0.4 MG SL  tablet Commonly known as:  NITROSTAT Place 1 tablet (0.4 mg total) under the tongue every 5 (five) minutes as needed for chest pain.   oxyCODONE-acetaminophen 5-325 MG tablet Commonly known as:  ROXICET Take 1 tablet by mouth every 4 (four) hours as needed.   potassium chloride SA 20 MEQ tablet Commonly known as:  K-DUR,KLOR-CON Take 20 mEq by mouth daily.   PRESERVISION AREDS PO Take 1 tablet by mouth 2 (two) times daily.   tiZANidine 2 MG tablet Commonly known as:  ZANAFLEX Take 1 tablet (2 mg total) by mouth every 6 (six) hours as needed for muscle spasms.   traMADol 50 MG tablet Commonly known as:  ULTRAM Take 1 tablet (50 mg total) by mouth every 12 (twelve) hours as needed. What changed:  reasons to take this   VITAMIN D-3 PO Take 1 tablet by mouth every morning.            Durable Medical Equipment        Start     Ordered   11/13/16 1542  DME Walker rolling  Once    Question:  Patient needs a walker to treat with the following condition  Answer:  Localized osteoarthritis of right knee   11/13/16 1542   11/13/16 1542  DME 3 n 1  Once     11/13/16 1542   11/13/16 1542  DME Bedside commode  Once    Question:  Patient needs a bedside commode to treat with the following condition  Answer:  Localized osteoarthritis of right knee   11/13/16 1542      Diagnostic Studies: Dg Chest 2 View  Result Date: 10/31/2016 CLINICAL DATA:  Preop for total knee arthroplasty. History of hypertension. EXAM: CHEST  2 VIEW COMPARISON:  09/07/2010 FINDINGS: Normal heart size and pulmonary vascularity. Nodular prominence in the right cardiophrenic angle is unchanged since prior study, likely representing cyst or lipoma. Lungs are clear. No blunting of costophrenic angles. No pneumothorax. Degenerative changes in the spine and shoulders. IMPRESSION: No active cardiopulmonary disease. Electronically Signed   By: Lucienne Capers M.D.   On: 10/31/2016 23:39    Disposition: 01-Home or  Self Care  Discharge Instructions    Call MD / Call 911    Complete by:  As directed    If you experience chest pain or shortness of breath, CALL 911 and be transported to the hospital  emergency room.  If you develope a fever above 101 F, pus (white drainage) or increased drainage or redness at the wound, or calf pain, call your surgeon's office.   Change dressing    Complete by:  As directed    Change dressing on 5, then change the dressing daily with sterile 4 x 4 inch gauze dressing and apply TED hose.  You may clean the incision with alcohol prior to redressing.   Constipation Prevention    Complete by:  As directed    Drink plenty of fluids.  Prune juice may be helpful.  You may use a stool softener, such as Colace (over the counter) 100 mg twice a day.  Use MiraLax (over the counter) for constipation as needed.   Diet - low sodium heart healthy    Complete by:  As directed    Driving restrictions    Complete by:  As directed    No driving for 2 weeks   Increase activity slowly as tolerated    Complete by:  As directed    Patient may shower    Complete by:  As directed    You may shower without a dressing once there is no drainage.  Do not wash over the wound.  If drainage remains, cover wound with plastic wrap and then shower.       Contact information for follow-up providers    Kerin Salen, MD Follow up in 2 week(s).   Specialty:  Orthopedic Surgery Contact information: [place] [address] [phone]            Contact information for after-discharge care    Destination    [facility] .   Specialty:  Skilled Nursing Facility Contact information: [address] [place] [phone]                   Signed: Hardin Negus, Artesha Wemhoff R 11/16/2016, 11:02 AM

## 2016-11-16 NOTE — Progress Notes (Signed)
Attempted to call report to the number provided by CSW but staff person unable to take report at that time.  Called main number for [employer], provided report to [place], [place].  Patient transported by [facility] to facility.

## 2016-11-17 ENCOUNTER — Encounter: Payer: Self-pay | Admitting: Internal Medicine

## 2016-11-17 ENCOUNTER — Non-Acute Institutional Stay (SKILLED_NURSING_FACILITY): Payer: Commercial Managed Care - HMO | Admitting: Internal Medicine

## 2016-11-17 DIAGNOSIS — D62 Acute posthemorrhagic anemia: Secondary | ICD-10-CM

## 2016-11-17 DIAGNOSIS — E785 Hyperlipidemia, unspecified: Secondary | ICD-10-CM

## 2016-11-17 DIAGNOSIS — R2681 Unsteadiness on feet: Secondary | ICD-10-CM | POA: Diagnosis not present

## 2016-11-17 DIAGNOSIS — I10 Essential (primary) hypertension: Secondary | ICD-10-CM | POA: Diagnosis not present

## 2016-11-17 DIAGNOSIS — F32A Depression, unspecified: Secondary | ICD-10-CM

## 2016-11-17 DIAGNOSIS — E871 Hypo-osmolality and hyponatremia: Secondary | ICD-10-CM

## 2016-11-17 DIAGNOSIS — F329 Major depressive disorder, single episode, unspecified: Secondary | ICD-10-CM | POA: Diagnosis not present

## 2016-11-17 DIAGNOSIS — M1711 Unilateral primary osteoarthritis, right knee: Secondary | ICD-10-CM | POA: Diagnosis not present

## 2016-11-17 DIAGNOSIS — E876 Hypokalemia: Secondary | ICD-10-CM

## 2016-11-17 DIAGNOSIS — K5901 Slow transit constipation: Secondary | ICD-10-CM

## 2016-11-17 DIAGNOSIS — I2510 Atherosclerotic heart disease of native coronary artery without angina pectoris: Secondary | ICD-10-CM | POA: Diagnosis not present

## 2016-11-17 NOTE — Progress Notes (Signed)
LOCATION: [facility]  PCP: Vena Austria, MD   Code Status: Full Code  Goals of care: Advanced Directive information Advanced Directives 10/31/2016  Does Patient Have a Medical Advance Directive? Yes  Type of Advance Directive [place] in Chart? No - copy requested       Extended Emergency Contact Information Primary Emergency Contact: Jensen,Wendy Address: [address] [place], [place] [address] Johnnette Litter of [place] Phone: [phone] Work Phone: [phone] Mobile Phone: [phone] Relation: Other Secondary Emergency Contact: Marijo [place] of [place] Phone: [phone] Relation: Niece   Allergies  Allergen Reactions  . Codeine Other (See Comments)    Makes her severely depressed     Chief Complaint  Patient presents with  . New Admit To SNF    New Admission Visit     HPI:  Patient is a 82 y.o. female seen today for short term rehabilitation post hospital admission from 11/13/2016-11/16/2016 with right knee osteoarthritis. She underwent right total knee arthroplasty on 11/13/2016. She is seen in her room today.  Review of Systems:  Constitutional: Negative for fever, chills, diaphoresis.  HENT: Negative for headache, congestion, nasal discharge Eyes: Negative for blurred vision, double vision and discharge. Wears glasses. Respiratory: Negative for shortness of breath and wheezing. Positive for dry cough for 1 week.  Cardiovascular: Negative for chest pain, palpitations, leg swelling.  Gastrointestinal: Negative for heartburn, nausea, vomiting, abdominal pain. Last bowel movement was Monday. At home had a bowel movement every day.  Genitourinary: Negative for dysuria and flank pain.  Musculoskeletal: Negative for fall in the facility.  Skin: Negative for itching, rash.  Neurological: Negative for dizziness. Psychiatric/Behavioral: Negative  for depression   Past Medical History:  Diagnosis Date  . Arthritis   . CAD (coronary artery disease)    s/p cath 09/07/10 w/3 vessel CAD, DES in RCA, Diagonal & LAD  . Dementia   . HTN (hypertension)   . Hyperlipemia   . MI (myocardial infarction)    Non-ST elevation 9/11   Past Surgical History:  Procedure Laterality Date  . CARDIAC CATHETERIZATION     YRS AGO   . CATARACT EXTRACTION W/ INTRAOCULAR LENS  IMPLANT, BILATERAL    . PARTIAL HYSTERECTOMY    . RETINAL DETACHMENT SURGERY     LEFT  . TOTAL KNEE ARTHROPLASTY Right 11/13/2016   Procedure: TOTAL KNEE ARTHROPLASTY;  Surgeon: Frederik Pear, MD;  Location: [facility];  Service: Orthopedics;  Laterality: Right;   Social History:   reports that she has never smoked. She has never used smokeless tobacco. She reports that she does not drink alcohol or use drugs.  Family History  Problem Relation Age of Onset  . Heart attack Mother   . Coronary artery disease Other     diagnosis of CAD but not premature diagnosis  . Coronary artery disease Brother     Medications:   Medication List       Accurate as of 11/17/16  3:13 PM. Always use your most recent med list.          amitriptyline 100 MG tablet Commonly known as:  ELAVIL Take 100 mg by mouth at bedtime.   amLODipine 5 MG tablet Commonly known as:  NORVASC TAKE 1 TABLET BY MOUTH EVERY DAY   amoxicillin 500 MG capsule Commonly known as:  AMOXIL Take 500 mg by mouth every 12 (twelve)  hours. For 7 days   aspirin EC 325 MG tablet Take 1 tablet (325 mg total) by mouth 2 (two) times daily.   atorvastatin 80 MG tablet Commonly known as:  LIPITOR Take 80 mg by mouth at bedtime.   fish oil-omega-3 fatty acids 1000 MG capsule Take 1 g by mouth 2 (two) times daily.   furosemide 20 MG tablet Commonly known as:  LASIX Take 20 mg by mouth daily.   losartan-hydrochlorothiazide 100-25 MG tablet Commonly known as:  HYZAAR Take 1 tablet by mouth every morning.     metoprolol 50 MG tablet Commonly known as:  LOPRESSOR Take 50 mg by mouth 2 (two) times daily.   nitroGLYCERIN 0.4 MG SL tablet Commonly known as:  NITROSTAT Place 1 tablet (0.4 mg total) under the tongue every 5 (five) minutes as needed for chest pain.   oxyCODONE-acetaminophen 5-325 MG tablet Commonly known as:  ROXICET Take 1 tablet by mouth every 4 (four) hours as needed.   potassium chloride SA 20 MEQ tablet Commonly known as:  K-DUR,KLOR-CON Take 20 mEq by mouth daily.   PRESERVISION AREDS PO Take 1 tablet by mouth 2 (two) times daily.   tiZANidine 2 MG tablet Commonly known as:  ZANAFLEX Take 1 tablet (2 mg total) by mouth every 6 (six) hours as needed for muscle spasms.   traMADol 50 MG tablet Commonly known as:  ULTRAM Take 1 tablet (50 mg total) by mouth every 12 (twelve) hours as needed.   VITAMIN D-3 PO Take 1 tablet by mouth every morning.       Immunizations:  There is no immunization history on file for this patient.   Physical Exam: Vitals:   11/17/16 1509  BP: (!) 132/56  Pulse: 79  Resp: 18  Temp: 99.7 F (37.6 C)  TempSrc: Oral  SpO2: 96%  Weight: 140 lb (63.5 kg)  Height: 5\' 2"  (1.575 m)   Body mass index is 25.61 kg/m.  General- elderly female, well built, in no acute distress Head- normocephalic, atraumatic Throat- moist mucus membrane Eyes- PERRLA, EOMI, no pallor, no icterus Neck- no cervical lymphadenopathy Cardiovascular- normal s1,s2, no murmur Respiratory- bilateral clear to auscultation, no wheeze, no rhonchi, no crackles, no use of accessory muscles Abdomen- bowel sounds present, soft, non tender Musculoskeletal- able to move all 4 extremities, limited right knee range of motion, Ace wrap dressing to her right leg, trace leg edema Neurological- alert and oriented to person, place and time Skin- warm and dry Psychiatry- normal mood and affect    Labs reviewed: Basic Metabolic Panel:  Recent Labs  10/31/16 1530  11/13/16 0836 11/14/16 0505  NA 135 135 133*  K 2.9* 3.3* 3.2*  CL 99*  --  96*  CO2 28  --  29  GLUCOSE 89 101* 126*  BUN 16  --  11  CREATININE 0.97  --  0.74  CALCIUM 9.4  --  8.3*   Liver Function Tests: No results for input(s): AST, ALT, ALKPHOS, BILITOT, PROT, ALBUMIN in the last 8760 hours. No results for input(s): LIPASE, AMYLASE in the last 8760 hours. No results for input(s): AMMONIA in the last 8760 hours. CBC:  Recent Labs  10/31/16 1530  11/14/16 0505 11/15/16 0502 11/16/16 0439  WBC 6.9  --  8.2 8.7 8.6  NEUTROABS 4.9  --   --   --   --   HGB 12.5  < > 9.2* 9.6* 8.5*  HCT 36.1  < > 27.1* 28.3* 24.7*  MCV  93.8  --  95.4 95.9 94.3  PLT 184  --  156 142* 176  < > = values in this interval not displayed. Cardiac Enzymes: No results for input(s): CKTOTAL, CKMB, CKMBINDEX, TROPONINI in the last 8760 hours. BNP: Invalid input(s): POCBNP CBG: No results for input(s): GLUCAP in the last 8760 hours.  Radiological Exams: Dg Chest 2 View  Result Date: 10/31/2016 CLINICAL DATA:  Preop for total knee arthroplasty. History of hypertension. EXAM: CHEST  2 VIEW COMPARISON:  09/07/2010 FINDINGS: Normal heart size and pulmonary vascularity. Nodular prominence in the right cardiophrenic angle is unchanged since prior study, likely representing cyst or lipoma. Lungs are clear. No blunting of costophrenic angles. No pneumothorax. Degenerative changes in the spine and shoulders. IMPRESSION: No active cardiopulmonary disease. Electronically Signed   By: Lucienne Capers M.D.   On: 10/31/2016 23:39    Assessment/Plan  Unsteady gait With recent right knee replacement surgery. Will have patient work with physical therapy and occupational therapy to help with gait training.  Right knee osteoarthritis Status post right total knee arthroplasty on 11/13/2016. Patient to follow-up with orthopedic. Continue Ultram 50 mg every 12 hours as needed for pain. Continue Percocet 5-3 25 mg 1  tablet every 4 hours as needed for pain. Continue Zanaflex 2 mg every 6 hours as needed for muscle spasm. TED hose to her legs to help with edema. Continue aspirin 325 mg twice a day for DVT prophylaxis for 2 weeks. PMR consult.  Acute blood loss anemia Postoperative, monitor H&H  Slow transit constipation Start senna as 2 tabs daily at bedtime and MiraLAX daily for 2 days from now then change to MiraLAX daily as needed.  Hyponatremia With her on Lasix and hydrochlorothiazide, check BMP.  Hypokalemia Currently on Lasix. Continue potassium chloride supplement. Check BMP.  Hypertension Continue Lopressor 50 mg twice a day, Hyzaar 100-25 milligrams daily and amlodipine 5 mg every other day. Continue Lasix 20 mg daily as well. Monitor blood pressure reading.  Coronary artery disease Remains chest pain-free. Continue losartan and Lopressor. Continue when necessary nitroglycerin. Continue daily aspirin and statin.  Chronic depression Continue amitriptyline  Hyperlipidemia Continue Lipitor    Goals of care: short term rehabilitation   Labs/tests ordered: CBC, CMP 11-20-16  Family/ staff Communication: reviewed care plan with patient and nursing supervisor    Blanchie Serve, MD Internal Medicine [place], [place] [address] Cell Phone (Monday-Friday 8 am - 5 pm): [phone] On Call: [phone] and follow prompts after 5 pm and on weekends Office Phone: [phone] Office Fax: [phone]

## 2016-11-20 LAB — HEPATIC FUNCTION PANEL
ALK PHOS: 127 U/L — AB (ref 25–125)
ALT: 51 U/L — AB (ref 7–35)
AST: 62 U/L — AB (ref 13–35)
Bilirubin, Total: 0.7 mg/dL

## 2016-11-20 LAB — CBC AND DIFFERENTIAL
HCT: 27 % — AB (ref 36–46)
Hemoglobin: 9.1 g/dL — AB (ref 12.0–16.0)
Neutrophils Absolute: 6 /uL
Platelets: 281 K/µL (ref 150–399)
WBC: 7 10*3/mL

## 2016-11-20 LAB — BASIC METABOLIC PANEL
BUN: 14 mg/dL (ref 4–21)
CREATININE: 0.7 mg/dL (ref 0.5–1.1)
Glucose: 122 mg/dL
POTASSIUM: 2.6 mmol/L — AB (ref 3.4–5.3)
Sodium: 125 mmol/L — AB (ref 137–147)

## 2016-11-21 DIAGNOSIS — M25561 Pain in right knee: Secondary | ICD-10-CM | POA: Diagnosis not present

## 2016-11-21 DIAGNOSIS — R2681 Unsteadiness on feet: Secondary | ICD-10-CM | POA: Diagnosis not present

## 2016-11-21 DIAGNOSIS — Z96651 Presence of right artificial knee joint: Secondary | ICD-10-CM | POA: Diagnosis not present

## 2016-11-21 LAB — BASIC METABOLIC PANEL
BUN: 14 mg/dL (ref 4–21)
CREATININE: 0.6 mg/dL (ref 0.5–1.1)
Glucose: 94 mg/dL
Potassium: 3.7 mmol/L (ref 3.4–5.3)
SODIUM: 135 mmol/L — AB (ref 137–147)

## 2016-11-27 DIAGNOSIS — R2681 Unsteadiness on feet: Secondary | ICD-10-CM | POA: Diagnosis not present

## 2016-11-27 DIAGNOSIS — Z96651 Presence of right artificial knee joint: Secondary | ICD-10-CM | POA: Diagnosis not present

## 2016-11-27 DIAGNOSIS — M25561 Pain in right knee: Secondary | ICD-10-CM | POA: Diagnosis not present

## 2016-11-28 ENCOUNTER — Encounter: Payer: Self-pay | Admitting: Adult Health

## 2016-11-28 ENCOUNTER — Non-Acute Institutional Stay (SKILLED_NURSING_FACILITY): Payer: Commercial Managed Care - HMO | Admitting: Adult Health

## 2016-11-28 DIAGNOSIS — F32A Depression, unspecified: Secondary | ICD-10-CM

## 2016-11-28 DIAGNOSIS — M1711 Unilateral primary osteoarthritis, right knee: Secondary | ICD-10-CM | POA: Diagnosis not present

## 2016-11-28 DIAGNOSIS — R2681 Unsteadiness on feet: Secondary | ICD-10-CM | POA: Diagnosis not present

## 2016-11-28 DIAGNOSIS — E876 Hypokalemia: Secondary | ICD-10-CM | POA: Diagnosis not present

## 2016-11-28 DIAGNOSIS — I10 Essential (primary) hypertension: Secondary | ICD-10-CM | POA: Diagnosis not present

## 2016-11-28 DIAGNOSIS — F329 Major depressive disorder, single episode, unspecified: Secondary | ICD-10-CM

## 2016-11-28 DIAGNOSIS — E785 Hyperlipidemia, unspecified: Secondary | ICD-10-CM

## 2016-11-28 DIAGNOSIS — K5901 Slow transit constipation: Secondary | ICD-10-CM | POA: Diagnosis not present

## 2016-11-28 NOTE — Progress Notes (Signed)
DATE:  11/28/2016   MRN:  [id number]  BIRTHDAY: July 11, 1930  Facility:  [facility] Location:  [facility] Room Number: [id number]  LEVEL OF CARE:  SNF [phone])  Contact Information    Name Relation Home Work [place] [phone]   Zyriana, [place] Niece [phone]         Code Status History    Date Active Date Inactive Code Status Order ID Comments User Context   11/13/2016  3:42 PM 11/16/2016  4:43 PM Full Code [id number]  Leighton Parody, PA-C Inpatient       Chief Complaint  Patient presents with  . Discharge Note    HISTORY OF PRESENT ILLNESS:  This is an 82 year old Ford being seen for a discharge visit.  She is discharging home on 11/30/16 with home health PT, OT, SW and CNA .  She has been admitted to [facility] on 11/16/16 for short-term rehabilitation following an admission at [facility] from 11/13/16-11/16/16 for a total knee arthroplasty.    Patient was admitted to this facility for short-term rehabilitation after the patient's recent hospitalization.  Patient has completed SNF rehabilitation and therapy has cleared the patient for discharge.   PAST MEDICAL HISTORY:  Past Medical History:  Diagnosis Date  . Arthritis   . CAD (coronary artery disease)    s/p cath 09/07/10 w/3 vessel CAD, DES in RCA, Diagonal & LAD  . Dementia   . HTN (hypertension)   . Hyperlipemia   . MI (myocardial infarction)    Non-ST elevation 9/11  . Primary osteoarthritis of right knee 09/2016     CURRENT MEDICATIONS: Reviewed  Patient's Medications  New Prescriptions   No medications on file  Previous Medications   AMITRIPTYLINE (ELAVIL) 100 MG TABLET    Take 100 mg by mouth at bedtime.   AMLODIPINE (NORVASC) 5 MG TABLET    Take 5 mg by mouth every other day.   ASPIRIN EC 325 MG TABLET    Take 1 tablet (325 mg total) by mouth 2 (two) times daily.   ATORVASTATIN (LIPITOR) 80 MG TABLET     Take 80 mg by mouth at bedtime.    CHOLECALCIFEROL (VITAMIN D-3 PO)    Take 1 tablet by mouth every morning.    FISH OIL-OMEGA-3 FATTY ACIDS 1000 MG CAPSULE    Take 1 g by mouth 2 (two) times daily.     FUROSEMIDE (LASIX) 20 MG TABLET    Take 20 mg by mouth daily.    LOSARTAN-HYDROCHLOROTHIAZIDE (HYZAAR) 100-25 MG PER TABLET    Take 1 tablet by mouth every morning.    METHOCARBAMOL (ROBAXIN) 500 MG TABLET    Take 500 mg by mouth every 6 (six) hours as needed for muscle spasms.   METOPROLOL (LOPRESSOR) 50 MG TABLET    Take 50 mg by mouth 2 (two) times daily.    MULTIPLE VITAMINS-MINERALS (PRESERVISION AREDS PO)    Take 1 tablet by mouth 2 (two) times daily.    NITROGLYCERIN (NITROSTAT) 0.4 MG SL TABLET    Place 1 tablet (0.4 mg total) under the tongue every 5 (five) minutes as needed for chest pain.   OXYCODONE-ACETAMINOPHEN (ROXICET) 5-325 MG TABLET    Take 1 tablet by mouth every 4 (four) hours as needed.   POLYETHYLENE GLYCOL (MIRALAX / GLYCOLAX) PACKET    Take 17 g by mouth daily.   POTASSIUM CHLORIDE SA (K-DUR,KLOR-CON) 20 MEQ TABLET  Take 20 mEq by mouth daily.   SENNOSIDES-DOCUSATE SODIUM (SENOKOT-S) 8.6-50 MG TABLET    Take 2 tablets by mouth at bedtime.   TRAMADOL (ULTRAM) 50 MG TABLET    Take 1 tablet (50 mg total) by mouth every 12 (twelve) hours as needed.  Modified Medications   No medications on file  Discontinued Medications   AMLODIPINE (NORVASC) 5 MG TABLET    TAKE 1 TABLET BY MOUTH EVERY DAY   AMOXICILLIN (AMOXIL) 500 MG CAPSULE    Take 500 mg by mouth every 12 (twelve) hours. For 7 days   TIZANIDINE (ZANAFLEX) 2 MG TABLET    Take 1 tablet (2 mg total) by mouth every 6 (six) hours as needed for muscle spasms.     Allergies  Allergen Reactions  . Codeine Other (See Comments)    Makes her severely depressed      REVIEW OF SYSTEMS:  GENERAL: no change in appetite, no fatigue, no weight changes, no fever, chills or weakness EYES: Denies change in vision, dry eyes, eye  pain, itching or discharge EARS: Denies change in hearing, ringing in ears, or earache NOSE: Denies nasal congestion or epistaxis MOUTH and THROAT: Denies oral discomfort, gingival pain or bleeding, pain from teeth or hoarseness   RESPIRATORY: no cough, SOB, DOE, wheezing, hemoptysis CARDIAC: no chest pain, edema or palpitations GI: no abdominal pain, diarrhea, constipation, heart burn, nausea or vomiting GU: Denies dysuria, frequency, hematuria, incontinence, or discharge PSYCHIATRIC: Denies feeling of depression or anxiety. No report of hallucinations, insomnia, paranoia, or agitation    PHYSICAL EXAMINATION  GENERAL APPEARANCE: Well nourished. In no acute distress. Normal body habitus SKIN:  Right knee surgical incision is dry, no erythema HEAD: Normal in size and contour. No evidence of trauma EYES: Lids open and close normally. No blepharitis, entropion or ectropion. PERRL. Conjunctivae are clear and sclerae are white. Lenses are without opacity EARS: Pinnae are normal. Patient hears normal voice tunes of the examiner MOUTH and THROAT: Lips are without lesions. Oral mucosa is moist and without lesions. Tongue is normal in shape, size, and color and without lesions NECK: supple, trachea midline, no neck masses, no thyroid tenderness, no thyromegaly LYMPHATICS: no LAN in the neck, no supraclavicular LAN RESPIRATORY: breathing is even & unlabored, BS CTAB CARDIAC: RRR, no murmur,no extra heart sounds, no edema GI: abdomen soft, normal BS, no masses, no tenderness, no hepatomegaly, no splenomegaly EXTREMITIES:  Able to move 4 extremities PSYCHIATRIC: Alert and oriented X 3. Affect and behavior are appropriate  LABS/RADIOLOGY: Labs reviewed: Basic Metabolic Panel:  Recent Labs  10/31/16 1530 11/13/16 0836 11/14/16 0505 11/20/16 11/21/16  NA 135 135 133* 125* 135*  K 2.9* 3.3* 3.2* 2.6* 3.7  CL 99*  --  96*  --   --   CO2 28  --  29  --   --   GLUCOSE 89 101* 126*  --   --     BUN 16  --  11 14 14   CREATININE 0.97  --  0.74 0.7 0.6  CALCIUM 9.4  --  8.3*  --   --    Liver Function Tests:  Recent Labs  11/20/16  AST 62*  ALT 51*  ALKPHOS 127*    CBC:  Recent Labs  10/31/16 1530  11/14/16 0505 11/15/16 0502 11/16/16 0439 11/20/16  WBC 6.9  --  8.2 8.7 8.6 7.0  NEUTROABS 4.9  --   --   --   --  6  HGB 12.5  < >  9.2* 9.6* 8.5* 9.1*  HCT 36.1  < > 27.1* 28.3* 24.7* 27*  MCV 93.8  --  95.4 95.9 94.3  --   PLT 184  --  156 142* 176 281  < > = values in this interval not displayed.   Dg Chest 2 View  Result Date: 10/31/2016 CLINICAL DATA:  Preop for total knee arthroplasty. History of hypertension. EXAM: CHEST  2 VIEW COMPARISON:  09/07/2010 FINDINGS: Normal heart size and pulmonary vascularity. Nodular prominence in the right cardiophrenic angle is unchanged since prior study, likely representing cyst or lipoma. Lungs are clear. No blunting of costophrenic angles. No pneumothorax. Degenerative changes in the spine and shoulders. IMPRESSION: No active cardiopulmonary disease. Electronically Signed   By: Lucienne Capers M.D.   On: 10/31/2016 23:39    ASSESSMENT/PLAN:  Unsteady gait - for home health PT, OT, CNA and Education officer, museum; for therapeutic strengthening exercises; fall precaution  Osteoarthritis of right knee  S/P right total knee arthroplasty - for home health PT, OT, CNA and social worker, for therapeutic strengthening exercises; continue Ultram 50 mg 1 tab by mouth every 12 hours when necessary and Percocet 5/325 mg 1 tab by mouth every 4 hours when necessary for pain; Robaxin 500 mg 1 tab by mouth every 6 hours when necessary for muscle spasm; follow-up with orthopedic surgeon  Hypertension - well controlled; continue Lopressor 50 mg 1 tab by mouth twice a day, Norvasc 5 mg 1 tab by mouth every other day, Hyzaar 100-25 mg 1 tab by mouth daily and Lasix 20 mg 1 tab by mouth daily  Hypokalemia - continue KCl ER 10 MG Q by mouth daily at 4 PM  and KCl ER 20 meq 1 tab by mouth every morning Lab Results  Component Value Date   K 3.7 11/21/2016   Constipation - continue MiraLAX 17 g by mouth daily when necessary, senna S 2 tabs by mouth daily at bedtime  Depression - continue amitriptyline 100 mg 1 tab by mouth daily at bedtime  Hyperlipidemia - continue Lipitor 80 mg 1 tab by mouth daily at bedtime  Anemia, acute blood loss - stable Lab Results  Component Value Date   HGB 9.1 (A) 11/20/2016        I have filled out patient's discharge paperwork and written prescriptions.  Patient will receive home health PT, OT, SW and CNA.  DME provided:  None  Total discharge time: Greater than 30 minutes Greater than 50% was spent in counseling and coordination of care with the patient.  Discharge time involved coordination of the discharge process with social worker, nursing staff and therapy department. Medical justification for home health services verified.    [employer] [phone]

## 2016-12-01 DIAGNOSIS — I2510 Atherosclerotic heart disease of native coronary artery without angina pectoris: Secondary | ICD-10-CM | POA: Diagnosis not present

## 2016-12-01 DIAGNOSIS — I252 Old myocardial infarction: Secondary | ICD-10-CM | POA: Diagnosis not present

## 2016-12-01 DIAGNOSIS — F0390 Unspecified dementia without behavioral disturbance: Secondary | ICD-10-CM | POA: Diagnosis not present

## 2016-12-01 DIAGNOSIS — Z96651 Presence of right artificial knee joint: Secondary | ICD-10-CM | POA: Diagnosis not present

## 2016-12-01 DIAGNOSIS — Z471 Aftercare following joint replacement surgery: Secondary | ICD-10-CM | POA: Diagnosis not present

## 2016-12-01 DIAGNOSIS — Z7982 Long term (current) use of aspirin: Secondary | ICD-10-CM | POA: Diagnosis not present

## 2016-12-01 DIAGNOSIS — I10 Essential (primary) hypertension: Secondary | ICD-10-CM | POA: Diagnosis not present

## 2016-12-01 DIAGNOSIS — M1990 Unspecified osteoarthritis, unspecified site: Secondary | ICD-10-CM | POA: Diagnosis not present

## 2016-12-01 DIAGNOSIS — E785 Hyperlipidemia, unspecified: Secondary | ICD-10-CM | POA: Diagnosis not present

## 2016-12-03 DIAGNOSIS — F32A Depression, unspecified: Secondary | ICD-10-CM | POA: Insufficient documentation

## 2016-12-03 DIAGNOSIS — F329 Major depressive disorder, single episode, unspecified: Secondary | ICD-10-CM | POA: Insufficient documentation

## 2016-12-06 DIAGNOSIS — I2510 Atherosclerotic heart disease of native coronary artery without angina pectoris: Secondary | ICD-10-CM | POA: Diagnosis not present

## 2016-12-06 DIAGNOSIS — F0390 Unspecified dementia without behavioral disturbance: Secondary | ICD-10-CM | POA: Diagnosis not present

## 2016-12-06 DIAGNOSIS — Z7982 Long term (current) use of aspirin: Secondary | ICD-10-CM | POA: Diagnosis not present

## 2016-12-06 DIAGNOSIS — Z471 Aftercare following joint replacement surgery: Secondary | ICD-10-CM | POA: Diagnosis not present

## 2016-12-06 DIAGNOSIS — I252 Old myocardial infarction: Secondary | ICD-10-CM | POA: Diagnosis not present

## 2016-12-06 DIAGNOSIS — M1990 Unspecified osteoarthritis, unspecified site: Secondary | ICD-10-CM | POA: Diagnosis not present

## 2016-12-06 DIAGNOSIS — E785 Hyperlipidemia, unspecified: Secondary | ICD-10-CM | POA: Diagnosis not present

## 2016-12-06 DIAGNOSIS — Z96651 Presence of right artificial knee joint: Secondary | ICD-10-CM | POA: Diagnosis not present

## 2016-12-06 DIAGNOSIS — I10 Essential (primary) hypertension: Secondary | ICD-10-CM | POA: Diagnosis not present

## 2016-12-07 DIAGNOSIS — M1990 Unspecified osteoarthritis, unspecified site: Secondary | ICD-10-CM | POA: Diagnosis not present

## 2016-12-07 DIAGNOSIS — Z471 Aftercare following joint replacement surgery: Secondary | ICD-10-CM | POA: Diagnosis not present

## 2016-12-07 DIAGNOSIS — F0390 Unspecified dementia without behavioral disturbance: Secondary | ICD-10-CM | POA: Diagnosis not present

## 2016-12-07 DIAGNOSIS — Z7982 Long term (current) use of aspirin: Secondary | ICD-10-CM | POA: Diagnosis not present

## 2016-12-07 DIAGNOSIS — I252 Old myocardial infarction: Secondary | ICD-10-CM | POA: Diagnosis not present

## 2016-12-07 DIAGNOSIS — E785 Hyperlipidemia, unspecified: Secondary | ICD-10-CM | POA: Diagnosis not present

## 2016-12-07 DIAGNOSIS — Z96651 Presence of right artificial knee joint: Secondary | ICD-10-CM | POA: Diagnosis not present

## 2016-12-07 DIAGNOSIS — I2510 Atherosclerotic heart disease of native coronary artery without angina pectoris: Secondary | ICD-10-CM | POA: Diagnosis not present

## 2016-12-07 DIAGNOSIS — I10 Essential (primary) hypertension: Secondary | ICD-10-CM | POA: Diagnosis not present

## 2016-12-08 DIAGNOSIS — E785 Hyperlipidemia, unspecified: Secondary | ICD-10-CM | POA: Diagnosis not present

## 2016-12-08 DIAGNOSIS — Z96651 Presence of right artificial knee joint: Secondary | ICD-10-CM | POA: Diagnosis not present

## 2016-12-08 DIAGNOSIS — F0390 Unspecified dementia without behavioral disturbance: Secondary | ICD-10-CM | POA: Diagnosis not present

## 2016-12-08 DIAGNOSIS — I252 Old myocardial infarction: Secondary | ICD-10-CM | POA: Diagnosis not present

## 2016-12-08 DIAGNOSIS — I2510 Atherosclerotic heart disease of native coronary artery without angina pectoris: Secondary | ICD-10-CM | POA: Diagnosis not present

## 2016-12-08 DIAGNOSIS — Z471 Aftercare following joint replacement surgery: Secondary | ICD-10-CM | POA: Diagnosis not present

## 2016-12-08 DIAGNOSIS — M1990 Unspecified osteoarthritis, unspecified site: Secondary | ICD-10-CM | POA: Diagnosis not present

## 2016-12-08 DIAGNOSIS — Z7982 Long term (current) use of aspirin: Secondary | ICD-10-CM | POA: Diagnosis not present

## 2016-12-08 DIAGNOSIS — I10 Essential (primary) hypertension: Secondary | ICD-10-CM | POA: Diagnosis not present

## 2017-01-02 DIAGNOSIS — M1711 Unilateral primary osteoarthritis, right knee: Secondary | ICD-10-CM | POA: Diagnosis not present

## 2017-01-04 DIAGNOSIS — R69 Illness, unspecified: Secondary | ICD-10-CM | POA: Diagnosis not present

## 2017-01-09 ENCOUNTER — Other Ambulatory Visit: Payer: Self-pay | Admitting: Adult Health

## 2017-01-10 ENCOUNTER — Other Ambulatory Visit: Payer: Self-pay | Admitting: Adult Health

## 2017-01-11 ENCOUNTER — Other Ambulatory Visit: Payer: Self-pay | Admitting: Adult Health

## 2017-01-12 DIAGNOSIS — L820 Inflamed seborrheic keratosis: Secondary | ICD-10-CM | POA: Diagnosis not present

## 2017-01-23 DIAGNOSIS — E2839 Other primary ovarian failure: Secondary | ICD-10-CM | POA: Diagnosis not present

## 2017-01-23 DIAGNOSIS — L259 Unspecified contact dermatitis, unspecified cause: Secondary | ICD-10-CM | POA: Diagnosis not present

## 2017-01-23 DIAGNOSIS — I10 Essential (primary) hypertension: Secondary | ICD-10-CM | POA: Diagnosis not present

## 2017-01-23 DIAGNOSIS — G4700 Insomnia, unspecified: Secondary | ICD-10-CM | POA: Diagnosis not present

## 2017-01-23 DIAGNOSIS — Z955 Presence of coronary angioplasty implant and graft: Secondary | ICD-10-CM | POA: Diagnosis not present

## 2017-01-23 DIAGNOSIS — Z0000 Encounter for general adult medical examination without abnormal findings: Secondary | ICD-10-CM | POA: Diagnosis not present

## 2017-01-23 DIAGNOSIS — M545 Low back pain: Secondary | ICD-10-CM | POA: Diagnosis not present

## 2017-01-23 DIAGNOSIS — E7800 Pure hypercholesterolemia, unspecified: Secondary | ICD-10-CM | POA: Diagnosis not present

## 2017-01-23 DIAGNOSIS — R609 Edema, unspecified: Secondary | ICD-10-CM | POA: Diagnosis not present

## 2017-02-13 DIAGNOSIS — M1711 Unilateral primary osteoarthritis, right knee: Secondary | ICD-10-CM | POA: Diagnosis not present

## 2017-04-04 DIAGNOSIS — I10 Essential (primary) hypertension: Secondary | ICD-10-CM | POA: Diagnosis not present

## 2017-04-04 DIAGNOSIS — E876 Hypokalemia: Secondary | ICD-10-CM | POA: Diagnosis not present

## 2017-04-04 DIAGNOSIS — R2689 Other abnormalities of gait and mobility: Secondary | ICD-10-CM | POA: Diagnosis not present

## 2017-04-17 ENCOUNTER — Encounter: Payer: Self-pay | Admitting: Cardiovascular Disease

## 2017-05-03 ENCOUNTER — Encounter: Payer: Self-pay | Admitting: Cardiovascular Disease

## 2017-05-03 ENCOUNTER — Ambulatory Visit (INDEPENDENT_AMBULATORY_CARE_PROVIDER_SITE_OTHER): Payer: Medicare HMO | Admitting: Cardiovascular Disease

## 2017-05-03 ENCOUNTER — Encounter (INDEPENDENT_AMBULATORY_CARE_PROVIDER_SITE_OTHER): Payer: Self-pay

## 2017-05-03 VITALS — BP 140/50 | HR 62 | Ht 62.0 in | Wt 141.4 lb

## 2017-05-03 DIAGNOSIS — E7800 Pure hypercholesterolemia, unspecified: Secondary | ICD-10-CM

## 2017-05-03 DIAGNOSIS — R011 Cardiac murmur, unspecified: Secondary | ICD-10-CM

## 2017-05-03 DIAGNOSIS — I10 Essential (primary) hypertension: Secondary | ICD-10-CM | POA: Diagnosis not present

## 2017-05-03 DIAGNOSIS — I2510 Atherosclerotic heart disease of native coronary artery without angina pectoris: Secondary | ICD-10-CM

## 2017-05-03 MED ORDER — ASPIRIN EC 81 MG PO TBEC: 81.0000 mg | DELAYED_RELEASE_TABLET | Freq: Every day | ORAL | 3 refills | Status: DC

## 2017-05-03 NOTE — Progress Notes (Signed)
Chief Complaint  Patient presents with  . Follow-up    CAD    History of Present Illness: 83 yo WF with history of HTN, hyperlipidemia and CAD here today for cardiac follow up. She had a NSTEMI and underwent cardiac cath on 09/07/10 and was found to have three vessel CAD. Drug eluting stents were placed in the RCA, LAD and Diagonal branch. She has done well since then. Syncopal event in December 2016. Event monitor with sinus, no arrhythmias. No recurrence of syncope. Echo September 2017 with normal LV function, no valve disease.   She is here today for follow up. The patient denies any chest pain, dyspnea, palpitations, lower extremity edema, orthopnea, PND, dizziness, near syncope or syncope.     Primary Care Physician: [name], MD  Past Medical History:  Diagnosis Date  . Arthritis   . CAD (coronary artery disease)    s/p cath 09/07/10 w/3 vessel CAD, DES in RCA, Diagonal & LAD  . Dementia   . HTN (hypertension)   . Hyperlipemia   . MI (myocardial infarction) ([facility])    Non-ST elevation 9/11  . Primary osteoarthritis of right knee 09/2016    Past Surgical History:  Procedure Laterality Date  . CARDIAC CATHETERIZATION     YRS AGO   . CATARACT EXTRACTION W/ INTRAOCULAR LENS  IMPLANT, BILATERAL    . PARTIAL HYSTERECTOMY    . RETINAL DETACHMENT SURGERY     LEFT  . TOTAL KNEE ARTHROPLASTY Right 11/13/2016   Procedure: TOTAL KNEE ARTHROPLASTY;  Surgeon: Frederik Pear, MD;  Location: [facility];  Service: Orthopedics;  Laterality: Right;    Current Outpatient Prescriptions  Medication Sig Dispense Refill  . amitriptyline (ELAVIL) 100 MG tablet Take 100 mg by mouth at bedtime.    [place] Kitchen amLODipine (NORVASC) 5 MG tablet Take 5 mg by mouth every other day.    [place] Kitchen atorvastatin (LIPITOR) 80 MG tablet Take 80 mg by mouth at bedtime.     . Cholecalciferol (VITAMIN D-3 PO) Take 1 tablet by mouth every morning.     . fish oil-omega-3 fatty acids 1000 MG capsule Take 1 g by mouth 2 (two) times  daily.      . furosemide (LASIX) 20 MG tablet Take 20 mg by mouth daily.     [place] Kitchen losartan-hydrochlorothiazide (HYZAAR) 100-25 MG per tablet Take 1 tablet by mouth every morning.     . metoprolol (LOPRESSOR) 50 MG tablet Take 50 mg by mouth 2 (two) times daily.     . Multiple Vitamins-Minerals (PRESERVISION AREDS PO) Take 1 tablet by mouth 2 (two) times daily.     . nitroGLYCERIN (NITROSTAT) 0.4 MG SL tablet Place 1 tablet (0.4 mg total) under the tongue every 5 (five) minutes as needed for chest pain. 25 tablet 6  . oxyCODONE-acetaminophen (ROXICET) 5-325 MG tablet Take 1 tablet by mouth every 4 (four) hours as needed. 60 tablet 0  . polyethylene glycol (MIRALAX / GLYCOLAX) packet Take 17 g by mouth daily.    . potassium chloride SA (K-DUR,KLOR-CON) 20 MEQ tablet Take 20 mEq by mouth daily.    . traMADol (ULTRAM) 50 MG tablet Take 1 tablet (50 mg total) by mouth every 12 (twelve) hours as needed. 10 tablet 0  . aspirin EC 81 MG tablet Take 1 tablet (81 mg total) by mouth daily. 90 tablet 3   No current facility-administered medications for this visit.     Allergies  Allergen Reactions  . Codeine Other (See Comments)  Makes her severely depressed     Social History   Social History  . Marital status: Single    Spouse name: N/A  . Number of children: N/A  . Years of education: N/A   Occupational History  . Not on file.   Social History Main Topics  . Smoking status: Never Smoker  . Smokeless tobacco: Never Used  . Alcohol use No  . Drug use: No  . Sexual activity: Not on file   Other Topics Concern  . Not on file   Social History Narrative  . No narrative on file    Family History  Problem Relation Age of Onset  . Heart attack Mother   . Coronary artery disease Other        diagnosis of CAD but not premature diagnosis  . Coronary artery disease Brother     Review of Systems:  As stated in the HPI and otherwise negative.   BP (!) 140/50   Pulse 62   Ht 5\' 2"   (1.575 m)   Wt 141 lb 6.4 oz (64.1 kg)   SpO2 96%   BMI 25.86 kg/m   Physical Examination: General: Well developed, well nourished, NAD  HEENT: OP clear, mucus membranes moist  SKIN: warm, dry. No rashes. Neuro: No focal deficits  Musculoskeletal: Muscle strength 5/5 all ext  Psychiatric: Mood and affect normal  Neck: No JVD, no carotid bruits, no thyromegaly, no lymphadenopathy.  Lungs:Clear bilaterally, no wheezes, rhonci, crackles Cardiovascular: Regular rate and rhythm. No murmurs, gallops or rubs. Abdomen:Soft. Bowel sounds present. Non-tender.  Extremities: No lower extremity edema. Pulses are 2 + in the bilateral DP/PT.  Echo 09/08/16: Left ventricle: The cavity size was normal. Wall thickness was   increased in a pattern of moderate LVH. Systolic function was   normal. The estimated ejection fraction was in the range of 50%   to 55%. Wall motion was normal; there were no regional wall   motion abnormalities. Doppler parameters are consistent with   abnormal left ventricular relaxation (grade 1 diastolic   dysfunction). - Mitral valve: Calcified annulus. Mildly thickened leaflets . - Left atrium: The atrium was mildly dilated.  EKG:  EKG is not ordered today. The ekg ordered today demonstrates   Recent Labs: 11/20/2016: ALT 51; Hemoglobin 9.1; Platelets 281 11/21/2016: BUN 14; Creatinine 0.6; Potassium 3.7; Sodium 135   Lipid Panel Followed in primary care   Wt Readings from Last 3 Encounters:  05/03/17 141 lb 6.4 oz (64.1 kg)  11/17/16 140 lb (63.5 kg)  11/13/16 140 lb (63.5 kg)     Other studies Reviewed: Additional studies/ records that were reviewed today include: . Review of the above records demonstrates:    Assessment and Plan:   1. CAD without angina: She has no chest pain suggestive of angina. Will continue ASA, statin and beta blocker. Given advanced age, will not plan stress testing unless she has symptoms. Change ASA to 81 mg daily.  2. HLD:  lipids followed in primary care. Continue statin.   3. HTN: BP is controlled. No changes.    4. Cardiac murmur: likely flow murmur. No valve disease on echo Sept 2017.   Current medicines are reviewed at length with the patient today.  The patient does not have concerns regarding medicines.  The following changes have been made:  no change  Labs/ tests ordered today include:   No orders of the defined types were placed in this encounter.   Disposition:   FU  with me in 12  months  Signed, Lauree Chandler, MD 05/03/2017 3:50 PM    [facility] [place], [place], [place]  [address] Phone: [phone]; Fax: [phone]

## 2017-05-03 NOTE — Patient Instructions (Signed)
Medication Instructions:  Your physician has recommended you make the following change in your medication: Decrease aspirin to 81 mg by mouth daily.    Labwork: none  Testing/Procedures: none  Follow-Up: Your physician recommends that you schedule a follow-up appointment in: 12 months. Please call our office in about 9 months to schedule this appointment    Any Other Special Instructions Will Be Listed Below (If Applicable).     If you need a refill on your cardiac medications before your next appointment, please call your pharmacy.   

## 2017-07-23 DIAGNOSIS — E7800 Pure hypercholesterolemia, unspecified: Secondary | ICD-10-CM | POA: Diagnosis not present

## 2017-07-23 DIAGNOSIS — Z6825 Body mass index (BMI) 25.0-25.9, adult: Secondary | ICD-10-CM | POA: Diagnosis not present

## 2017-07-23 DIAGNOSIS — Z955 Presence of coronary angioplasty implant and graft: Secondary | ICD-10-CM | POA: Diagnosis not present

## 2017-07-23 DIAGNOSIS — I10 Essential (primary) hypertension: Secondary | ICD-10-CM | POA: Diagnosis not present

## 2017-07-23 DIAGNOSIS — H6123 Impacted cerumen, bilateral: Secondary | ICD-10-CM | POA: Diagnosis not present

## 2017-07-23 DIAGNOSIS — L720 Epidermal cyst: Secondary | ICD-10-CM | POA: Diagnosis not present

## 2017-07-23 DIAGNOSIS — R609 Edema, unspecified: Secondary | ICD-10-CM | POA: Diagnosis not present

## 2017-07-23 DIAGNOSIS — M545 Low back pain: Secondary | ICD-10-CM | POA: Diagnosis not present

## 2017-07-23 DIAGNOSIS — G4700 Insomnia, unspecified: Secondary | ICD-10-CM | POA: Diagnosis not present

## 2017-08-27 DIAGNOSIS — R350 Frequency of micturition: Secondary | ICD-10-CM | POA: Diagnosis not present

## 2017-08-27 DIAGNOSIS — R32 Unspecified urinary incontinence: Secondary | ICD-10-CM | POA: Diagnosis not present

## 2017-09-03 DIAGNOSIS — R32 Unspecified urinary incontinence: Secondary | ICD-10-CM | POA: Diagnosis not present

## 2017-09-10 DIAGNOSIS — Z23 Encounter for immunization: Secondary | ICD-10-CM | POA: Diagnosis not present

## 2017-09-10 DIAGNOSIS — R32 Unspecified urinary incontinence: Secondary | ICD-10-CM | POA: Diagnosis not present

## 2017-09-10 DIAGNOSIS — E876 Hypokalemia: Secondary | ICD-10-CM | POA: Diagnosis not present

## 2017-10-30 DIAGNOSIS — L720 Epidermal cyst: Secondary | ICD-10-CM | POA: Diagnosis not present

## 2017-12-15 ENCOUNTER — Other Ambulatory Visit: Payer: Self-pay | Admitting: Adult Health

## 2018-01-28 DIAGNOSIS — H348112 Central retinal vein occlusion, right eye, stable: Secondary | ICD-10-CM | POA: Diagnosis not present

## 2018-01-28 DIAGNOSIS — Z9889 Other specified postprocedural states: Secondary | ICD-10-CM | POA: Diagnosis not present

## 2018-01-28 DIAGNOSIS — H353132 Nonexudative age-related macular degeneration, bilateral, intermediate dry stage: Secondary | ICD-10-CM | POA: Diagnosis not present

## 2018-01-28 DIAGNOSIS — Z961 Presence of intraocular lens: Secondary | ICD-10-CM | POA: Diagnosis not present

## 2018-01-29 DIAGNOSIS — N390 Urinary tract infection, site not specified: Secondary | ICD-10-CM | POA: Diagnosis not present

## 2018-01-29 DIAGNOSIS — G4700 Insomnia, unspecified: Secondary | ICD-10-CM | POA: Diagnosis not present

## 2018-01-29 DIAGNOSIS — I10 Essential (primary) hypertension: Secondary | ICD-10-CM | POA: Diagnosis not present

## 2018-01-29 DIAGNOSIS — E7800 Pure hypercholesterolemia, unspecified: Secondary | ICD-10-CM | POA: Diagnosis not present

## 2018-01-29 DIAGNOSIS — L259 Unspecified contact dermatitis, unspecified cause: Secondary | ICD-10-CM | POA: Diagnosis not present

## 2018-01-29 DIAGNOSIS — Z955 Presence of coronary angioplasty implant and graft: Secondary | ICD-10-CM | POA: Diagnosis not present

## 2018-01-29 DIAGNOSIS — R609 Edema, unspecified: Secondary | ICD-10-CM | POA: Diagnosis not present

## 2018-01-29 DIAGNOSIS — M545 Low back pain: Secondary | ICD-10-CM | POA: Diagnosis not present

## 2018-01-29 DIAGNOSIS — Z1389 Encounter for screening for other disorder: Secondary | ICD-10-CM | POA: Diagnosis not present

## 2018-01-29 DIAGNOSIS — Z0000 Encounter for general adult medical examination without abnormal findings: Secondary | ICD-10-CM | POA: Diagnosis not present

## 2018-02-20 DIAGNOSIS — E860 Dehydration: Secondary | ICD-10-CM | POA: Diagnosis not present

## 2018-03-12 DIAGNOSIS — K219 Gastro-esophageal reflux disease without esophagitis: Secondary | ICD-10-CM | POA: Diagnosis not present

## 2018-03-12 DIAGNOSIS — R195 Other fecal abnormalities: Secondary | ICD-10-CM | POA: Diagnosis not present

## 2018-03-20 DIAGNOSIS — M810 Age-related osteoporosis without current pathological fracture: Secondary | ICD-10-CM | POA: Diagnosis not present

## 2018-03-20 DIAGNOSIS — E2839 Other primary ovarian failure: Secondary | ICD-10-CM | POA: Diagnosis not present

## 2018-08-28 DIAGNOSIS — E7800 Pure hypercholesterolemia, unspecified: Secondary | ICD-10-CM | POA: Diagnosis not present

## 2018-08-28 DIAGNOSIS — R609 Edema, unspecified: Secondary | ICD-10-CM | POA: Diagnosis not present

## 2018-08-28 DIAGNOSIS — R55 Syncope and collapse: Secondary | ICD-10-CM | POA: Diagnosis not present

## 2018-08-28 DIAGNOSIS — I10 Essential (primary) hypertension: Secondary | ICD-10-CM | POA: Diagnosis not present

## 2018-08-28 DIAGNOSIS — G4700 Insomnia, unspecified: Secondary | ICD-10-CM | POA: Diagnosis not present

## 2018-08-28 DIAGNOSIS — Z23 Encounter for immunization: Secondary | ICD-10-CM | POA: Diagnosis not present

## 2018-08-28 DIAGNOSIS — L259 Unspecified contact dermatitis, unspecified cause: Secondary | ICD-10-CM | POA: Diagnosis not present

## 2018-08-28 DIAGNOSIS — N390 Urinary tract infection, site not specified: Secondary | ICD-10-CM | POA: Diagnosis not present

## 2018-08-28 DIAGNOSIS — Z955 Presence of coronary angioplasty implant and graft: Secondary | ICD-10-CM | POA: Diagnosis not present

## 2018-08-28 DIAGNOSIS — M545 Low back pain: Secondary | ICD-10-CM | POA: Diagnosis not present

## 2018-09-06 DIAGNOSIS — J029 Acute pharyngitis, unspecified: Secondary | ICD-10-CM | POA: Diagnosis not present

## 2018-09-24 DIAGNOSIS — R531 Weakness: Secondary | ICD-10-CM | POA: Diagnosis not present

## 2018-09-24 DIAGNOSIS — R609 Edema, unspecified: Secondary | ICD-10-CM | POA: Diagnosis not present

## 2018-09-24 DIAGNOSIS — D7589 Other specified diseases of blood and blood-forming organs: Secondary | ICD-10-CM | POA: Diagnosis not present

## 2018-09-24 DIAGNOSIS — E46 Unspecified protein-calorie malnutrition: Secondary | ICD-10-CM | POA: Diagnosis not present

## 2018-10-01 ENCOUNTER — Other Ambulatory Visit: Payer: Self-pay

## 2018-10-01 ENCOUNTER — Emergency Department (HOSPITAL_COMMUNITY): Payer: Medicare HMO

## 2018-10-01 ENCOUNTER — Inpatient Hospital Stay (HOSPITAL_COMMUNITY)
Admission: EM | Admit: 2018-10-01 | Discharge: 2018-10-09 | DRG: 292 | Disposition: A | Discharge status: Skilled Nursing Facility | Payer: Medicare HMO | Attending: Internal Medicine | Admitting: Internal Medicine

## 2018-10-01 ENCOUNTER — Encounter (HOSPITAL_COMMUNITY): Payer: Self-pay | Admitting: Emergency Medicine

## 2018-10-01 ENCOUNTER — Telehealth: Payer: Self-pay | Admitting: Cardiovascular Disease

## 2018-10-01 DIAGNOSIS — I2510 Atherosclerotic heart disease of native coronary artery without angina pectoris: Secondary | ICD-10-CM | POA: Diagnosis not present

## 2018-10-01 DIAGNOSIS — I4510 Unspecified right bundle-branch block: Secondary | ICD-10-CM | POA: Diagnosis present

## 2018-10-01 DIAGNOSIS — R140 Abdominal distension (gaseous): Secondary | ICD-10-CM | POA: Diagnosis present

## 2018-10-01 DIAGNOSIS — F329 Major depressive disorder, single episode, unspecified: Secondary | ICD-10-CM | POA: Diagnosis present

## 2018-10-01 DIAGNOSIS — J90 Pleural effusion, not elsewhere classified: Secondary | ICD-10-CM | POA: Diagnosis not present

## 2018-10-01 DIAGNOSIS — R7989 Other specified abnormal findings of blood chemistry: Secondary | ICD-10-CM | POA: Diagnosis not present

## 2018-10-01 DIAGNOSIS — R188 Other ascites: Secondary | ICD-10-CM | POA: Diagnosis not present

## 2018-10-01 DIAGNOSIS — Z79899 Other long term (current) drug therapy: Secondary | ICD-10-CM

## 2018-10-01 DIAGNOSIS — I110 Hypertensive heart disease with heart failure: Principal | ICD-10-CM | POA: Diagnosis present

## 2018-10-01 DIAGNOSIS — R1310 Dysphagia, unspecified: Secondary | ICD-10-CM | POA: Diagnosis not present

## 2018-10-01 DIAGNOSIS — I5033 Acute on chronic diastolic (congestive) heart failure: Secondary | ICD-10-CM | POA: Diagnosis present

## 2018-10-01 DIAGNOSIS — K449 Diaphragmatic hernia without obstruction or gangrene: Secondary | ICD-10-CM | POA: Diagnosis not present

## 2018-10-01 DIAGNOSIS — R531 Weakness: Secondary | ICD-10-CM | POA: Diagnosis present

## 2018-10-01 DIAGNOSIS — E785 Hyperlipidemia, unspecified: Secondary | ICD-10-CM | POA: Diagnosis present

## 2018-10-01 DIAGNOSIS — R609 Edema, unspecified: Secondary | ICD-10-CM | POA: Diagnosis not present

## 2018-10-01 DIAGNOSIS — K219 Gastro-esophageal reflux disease without esophagitis: Secondary | ICD-10-CM | POA: Diagnosis not present

## 2018-10-01 DIAGNOSIS — E46 Unspecified protein-calorie malnutrition: Secondary | ICD-10-CM | POA: Diagnosis present

## 2018-10-01 DIAGNOSIS — K224 Dyskinesia of esophagus: Secondary | ICD-10-CM | POA: Diagnosis not present

## 2018-10-01 DIAGNOSIS — I5032 Chronic diastolic (congestive) heart failure: Secondary | ICD-10-CM | POA: Diagnosis not present

## 2018-10-01 DIAGNOSIS — D539 Nutritional anemia, unspecified: Secondary | ICD-10-CM | POA: Diagnosis not present

## 2018-10-01 DIAGNOSIS — R55 Syncope and collapse: Secondary | ICD-10-CM | POA: Diagnosis present

## 2018-10-01 DIAGNOSIS — Z7983 Long term (current) use of bisphosphonates: Secondary | ICD-10-CM | POA: Diagnosis not present

## 2018-10-01 DIAGNOSIS — E876 Hypokalemia: Secondary | ICD-10-CM | POA: Diagnosis not present

## 2018-10-01 DIAGNOSIS — D509 Iron deficiency anemia, unspecified: Secondary | ICD-10-CM | POA: Diagnosis present

## 2018-10-01 DIAGNOSIS — R41841 Cognitive communication deficit: Secondary | ICD-10-CM | POA: Diagnosis not present

## 2018-10-01 DIAGNOSIS — I10 Essential (primary) hypertension: Secondary | ICD-10-CM | POA: Diagnosis not present

## 2018-10-01 DIAGNOSIS — I509 Heart failure, unspecified: Secondary | ICD-10-CM

## 2018-10-01 DIAGNOSIS — Z96651 Presence of right artificial knee joint: Secondary | ICD-10-CM | POA: Diagnosis present

## 2018-10-01 DIAGNOSIS — F0390 Unspecified dementia without behavioral disturbance: Secondary | ICD-10-CM | POA: Diagnosis present

## 2018-10-01 DIAGNOSIS — T502X5A Adverse effect of carbonic-anhydrase inhibitors, benzothiadiazides and other diuretics, initial encounter: Secondary | ICD-10-CM | POA: Diagnosis not present

## 2018-10-01 DIAGNOSIS — Z7982 Long term (current) use of aspirin: Secondary | ICD-10-CM

## 2018-10-01 DIAGNOSIS — I5030 Unspecified diastolic (congestive) heart failure: Secondary | ICD-10-CM | POA: Diagnosis not present

## 2018-10-01 DIAGNOSIS — M6281 Muscle weakness (generalized): Secondary | ICD-10-CM | POA: Diagnosis not present

## 2018-10-01 DIAGNOSIS — I5031 Acute diastolic (congestive) heart failure: Secondary | ICD-10-CM | POA: Diagnosis not present

## 2018-10-01 DIAGNOSIS — I252 Old myocardial infarction: Secondary | ICD-10-CM

## 2018-10-01 DIAGNOSIS — Z955 Presence of coronary angioplasty implant and graft: Secondary | ICD-10-CM

## 2018-10-01 DIAGNOSIS — Z6824 Body mass index (BMI) 24.0-24.9, adult: Secondary | ICD-10-CM

## 2018-10-01 DIAGNOSIS — R278 Other lack of coordination: Secondary | ICD-10-CM | POA: Diagnosis not present

## 2018-10-01 DIAGNOSIS — M2550 Pain in unspecified joint: Secondary | ICD-10-CM | POA: Diagnosis not present

## 2018-10-01 DIAGNOSIS — Z885 Allergy status to narcotic agent status: Secondary | ICD-10-CM

## 2018-10-01 DIAGNOSIS — Z8249 Family history of ischemic heart disease and other diseases of the circulatory system: Secondary | ICD-10-CM

## 2018-10-01 DIAGNOSIS — Z7401 Bed confinement status: Secondary | ICD-10-CM | POA: Diagnosis not present

## 2018-10-01 DIAGNOSIS — R0902 Hypoxemia: Secondary | ICD-10-CM | POA: Diagnosis not present

## 2018-10-01 DIAGNOSIS — R1312 Dysphagia, oropharyngeal phase: Secondary | ICD-10-CM | POA: Diagnosis not present

## 2018-10-01 LAB — CBC
HEMATOCRIT: 32.3 % — AB (ref 36.0–46.0)
HEMOGLOBIN: 10.2 g/dL — AB (ref 12.0–15.0)
MCH: 32.1 pg (ref 26.0–34.0)
MCHC: 31.6 g/dL (ref 30.0–36.0)
MCV: 101.6 fL — AB (ref 80.0–100.0)
NRBC: 0 % (ref 0.0–0.2)
Platelets: 319 10*3/uL (ref 150–400)
RBC: 3.18 MIL/uL — AB (ref 3.87–5.11)
RDW: 12.2 % (ref 11.5–15.5)
WBC: 8.5 10*3/uL (ref 4.0–10.5)

## 2018-10-01 LAB — COMPREHENSIVE METABOLIC PANEL
ALT: 47 U/L — ABNORMAL HIGH (ref 0–44)
ANION GAP: 10 (ref 5–15)
AST: 71 U/L — ABNORMAL HIGH (ref 15–41)
Albumin: 2.5 g/dL — ABNORMAL LOW (ref 3.5–5.0)
Alkaline Phosphatase: 147 U/L — ABNORMAL HIGH (ref 38–126)
BUN: 18 mg/dL (ref 8–23)
CHLORIDE: 100 mmol/L (ref 98–111)
CO2: 22 mmol/L (ref 22–32)
Calcium: 8 mg/dL — ABNORMAL LOW (ref 8.9–10.3)
Creatinine, Ser: 0.71 mg/dL (ref 0.44–1.00)
Glucose, Bld: 105 mg/dL — ABNORMAL HIGH (ref 70–99)
POTASSIUM: 3.7 mmol/L (ref 3.5–5.1)
SODIUM: 132 mmol/L — AB (ref 135–145)
Total Bilirubin: 0.8 mg/dL (ref 0.3–1.2)
Total Protein: 6.3 g/dL — ABNORMAL LOW (ref 6.5–8.1)

## 2018-10-01 LAB — RETICULOCYTES
IMMATURE RETIC FRACT: 13.3 % (ref 2.3–15.9)
RBC.: 3.42 MIL/uL — ABNORMAL LOW (ref 3.87–5.11)
Retic Count, Absolute: 54 10*3/uL (ref 19.0–186.0)
Retic Ct Pct: 1.6 % (ref 0.4–3.1)

## 2018-10-01 LAB — BRAIN NATRIURETIC PEPTIDE: B Natriuretic Peptide: 109.8 pg/mL — ABNORMAL HIGH (ref 0.0–100.0)

## 2018-10-01 LAB — D-DIMER, QUANTITATIVE (NOT AT ARMC): D DIMER QUANT: 11.46 ug{FEU}/mL — AB (ref 0.00–0.50)

## 2018-10-01 LAB — LIPASE, BLOOD: LIPASE: 34 U/L (ref 11–51)

## 2018-10-01 MED ORDER — IRBESARTAN 150 MG PO TABS
150.0000 mg | ORAL_TABLET | Freq: Every day | ORAL | Status: DC
  Administered 2018-10-02 – 2018-10-09 (×8): 150 mg via ORAL
  Filled 2018-10-01 (×8): qty 1

## 2018-10-01 MED ORDER — ASPIRIN EC 81 MG PO TBEC
81.0000 mg | DELAYED_RELEASE_TABLET | Freq: Every day | ORAL | Status: DC
  Administered 2018-10-02 – 2018-10-09 (×8): 81 mg via ORAL
  Filled 2018-10-01 (×8): qty 1

## 2018-10-01 MED ORDER — FUROSEMIDE 10 MG/ML IJ SOLN
40.0000 mg | Freq: Once | INTRAMUSCULAR | Status: AC
  Administered 2018-10-01: 40 mg via INTRAVENOUS
  Filled 2018-10-01: qty 4

## 2018-10-01 MED ORDER — ACETAMINOPHEN 325 MG PO TABS
650.0000 mg | ORAL_TABLET | Freq: Four times a day (QID) | ORAL | Status: DC | PRN
  Administered 2018-10-07: 650 mg via ORAL
  Filled 2018-10-01: qty 2

## 2018-10-01 MED ORDER — NITROGLYCERIN 0.4 MG SL SUBL: 0.4000 mg | SUBLINGUAL_TABLET | SUBLINGUAL | Status: DC | PRN

## 2018-10-01 MED ORDER — FAMOTIDINE 20 MG PO TABS
20.0000 mg | ORAL_TABLET | Freq: Every day | ORAL | Status: DC
  Administered 2018-10-01 – 2018-10-04 (×4): 20 mg via ORAL
  Filled 2018-10-01 (×4): qty 1

## 2018-10-01 MED ORDER — POTASSIUM CHLORIDE CRYS ER 20 MEQ PO TBCR
20.0000 meq | EXTENDED_RELEASE_TABLET | Freq: Every day | ORAL | Status: DC
  Administered 2018-10-01 – 2018-10-03 (×3): 20 meq via ORAL
  Filled 2018-10-01 (×3): qty 1

## 2018-10-01 MED ORDER — ACETAMINOPHEN 650 MG RE SUPP: 650.0000 mg | Freq: Four times a day (QID) | RECTAL | Status: DC | PRN

## 2018-10-01 MED ORDER — ENOXAPARIN SODIUM 40 MG/0.4ML ~~LOC~~ SOLN
40.0000 mg | Freq: Every day | SUBCUTANEOUS | Status: DC
  Administered 2018-10-01 – 2018-10-08 (×8): 40 mg via SUBCUTANEOUS
  Filled 2018-10-01 (×8): qty 0.4

## 2018-10-01 MED ORDER — HYDROCHLOROTHIAZIDE 25 MG PO TABS
25.0000 mg | ORAL_TABLET | Freq: Every morning | ORAL | Status: DC
  Administered 2018-10-02 – 2018-10-09 (×8): 25 mg via ORAL
  Filled 2018-10-01 (×8): qty 1

## 2018-10-01 MED ORDER — ONDANSETRON HCL 4 MG PO TABS: 4.0000 mg | ORAL_TABLET | Freq: Four times a day (QID) | ORAL | Status: DC | PRN

## 2018-10-01 MED ORDER — ATORVASTATIN CALCIUM 40 MG PO TABS
80.0000 mg | ORAL_TABLET | Freq: Every day | ORAL | Status: DC
  Administered 2018-10-01 – 2018-10-08 (×8): 80 mg via ORAL
  Filled 2018-10-01 (×8): qty 2

## 2018-10-01 MED ORDER — METOPROLOL TARTRATE 50 MG PO TABS
50.0000 mg | ORAL_TABLET | Freq: Two times a day (BID) | ORAL | Status: DC
  Administered 2018-10-01 – 2018-10-09 (×16): 50 mg via ORAL
  Filled 2018-10-01 (×16): qty 1

## 2018-10-01 MED ORDER — OMEGA-3-ACID ETHYL ESTERS 1 G PO CAPS
1.0000 g | ORAL_CAPSULE | Freq: Two times a day (BID) | ORAL | Status: DC
  Administered 2018-10-01 – 2018-10-09 (×16): 1 g via ORAL
  Filled 2018-10-01 (×16): qty 1

## 2018-10-01 MED ORDER — FERROUS GLUCONATE 324 (38 FE) MG PO TABS
324.0000 mg | ORAL_TABLET | Freq: Every day | ORAL | Status: DC
  Administered 2018-10-02 – 2018-10-03 (×2): 324 mg via ORAL
  Filled 2018-10-01 (×2): qty 1

## 2018-10-01 MED ORDER — AMITRIPTYLINE HCL 25 MG PO TABS
100.0000 mg | ORAL_TABLET | Freq: Every day | ORAL | Status: DC
  Administered 2018-10-01 – 2018-10-08 (×8): 100 mg via ORAL
  Filled 2018-10-01 (×8): qty 4

## 2018-10-01 MED ORDER — FUROSEMIDE 10 MG/ML IJ SOLN
40.0000 mg | Freq: Two times a day (BID) | INTRAMUSCULAR | Status: DC
  Administered 2018-10-02 – 2018-10-03 (×3): 40 mg via INTRAVENOUS
  Filled 2018-10-01 (×3): qty 4

## 2018-10-01 MED ORDER — ONDANSETRON HCL 4 MG/2ML IJ SOLN: 4.0000 mg | Freq: Four times a day (QID) | INTRAMUSCULAR | Status: DC | PRN

## 2018-10-01 NOTE — ED Triage Notes (Addendum)
Per EMS, patient from [facility], c/o edema to bilateral legs and abdominal distension with "gas pains" x3 weeks. Denies chest pain and SOB. Denies N/V/D. Ambulatory.
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
## 2018-10-01 NOTE — Telephone Encounter (Signed)
Pt daughter Kristen Loader on [place]) called to report that the pt resides at [facility] and has been declining over the past several weeks. She is at the point that she cannot do her ADL's.. She is very fatigued, has bilateral lower and upper extremity edema, and abdominal distention. She was told by her PMD that her HGB has been dangerously low and they are treating her with oral FE. She has a h/o transfusions. She says she was barely responsive a few times that she has visited her recently. I urged her to call EMS and have her taken to the ER. She said her Mom was reluctant to go to the ER with the fear of sitting in the waiting room for an extended period of time but I advised her that she should call EMS. She verbalized understanding and agreed. I advised her that I will forward to Dr. Angelena Form and let him know what is going on. She asked to not cancel her appt with him this Friday until she is sure that she gets admitted and says she will call back and cancel.

## 2018-10-01 NOTE — ED Notes (Signed)
Turkey sandwich and water given to pt.

## 2018-10-01 NOTE — ED Provider Notes (Signed)
Medical screening examination/treatment/procedure(s) were conducted as a shared visit with non-physician practitioner(s) and myself.  I personally evaluated the patient during the encounter.  EKG Interpretation  Date/Time:  Tuesday October 01 2018 18:03:15 EDT Ventricular Rate:  79 PR Interval:    QRS Duration: 100 QT Interval:  389 QTC Calculation: 446 R Axis:   -53 Text Interpretation:  Sinus rhythm Incomplete RBBB and LAFB Low voltage, precordial leads RSR' in V1 or V2, right VCD or RVH Baseline wander in lead(s) I II aVR no acute ischemic changes. normalization of  Twave  inversions since last tracing. Confirmed by Charlesetta Shanks [phone]) on 10/01/2018 7:01:02 PM [name] reports that she is getting increasing swelling in her legs, increasing shortness of breath and increasing abdominal distention.  She reports that she has been seeing her primary care doctor and been instructed to give Lasix.  She has been compliant with Lasix but is under a fine balance between being told that the patient is dehydrated and then taking the Lasix.  Past week is gotten worse in terms of swelling of the legs.  It used to respond to Lasix but now it is just increased and not improving.  She is alert and appropriate.  She is nontoxic.  She does not have respiratory distress at rest.  Heart is regular.  Lungs decreased breath sounds at bases.  Slight crackle in renal.  Abdomen mild diffuse distention with no tenderness to palpation.  Symmetric 2+ edema bilateral lower legs.  CXR x-ray shows some pleural effusion.  Findings are suggestive of CHF.  Patient is daughter denies that she has had echo anytime recently since the symptoms have developed.  Will administer Lasix in the emergency department and plan for admission.     Charlesetta Shanks, MD 10/01/18 2024

## 2018-10-01 NOTE — Telephone Encounter (Signed)
Agree that she should go to the ED for evaluation. Gerald Stabs

## 2018-10-01 NOTE — ED Notes (Signed)
ED TO INPATIENT HANDOFF REPORT  Name/Age/Gender Rebecca Ford 84 y.o. female  Code Status    Code Status Orders  (From admission, onward)         Start     Ordered   10/01/18 2232  Full code  Continuous     10/01/18 2234        Code Status History    Date Active Date Inactive Code Status Order ID Comments User Context   11/13/2016 1542 11/16/2016 1643 Full Code [id number]  Leighton Parody, PA-C Inpatient    Advance Directive Documentation     Most Recent Value  Type of Advance Directive  Healthcare Power of Attorney  Pre-existing out of facility DNR order (yellow form or pink MOST form)  -  "MOST" Form in Place?  -      Home/SNF/Other Skilled nursing facility  Chief Complaint Edema  Level of Care/Admitting Diagnosis ED Disposition    ED Disposition Condition [place]: [facility] [100102]  Level of Care: Telemetry [5]  Admit to tele based on following criteria: Acute CHF  Diagnosis: Acute CHF (congestive heart failure) [facility]) [128786]  Admitting Physician: [name] [phone]  Attending Physician: [name] [email or web address]  PT Class (Do Not Modify): Observation [104]  PT Acc Code (Do Not Modify): Observation [10022]       Medical History Past Medical History:  Diagnosis Date  . Arthritis   . CAD (coronary artery disease)    s/p cath 09/07/10 w/3 vessel CAD, DES in RCA, Diagonal & LAD  . Dementia ([place])   . HTN (hypertension)   . Hyperlipemia   . MI (myocardial infarction) ([place])    Non-ST elevation 9/11  . Primary osteoarthritis of right knee 09/2016    Allergies Allergies  Allergen Reactions  . Codeine Other (See Comments)    Makes her severely depressed     IV Location/Drains/Wounds Patient Lines/Drains/Airways Status   Active Line/Drains/Airways    Name:   Placement date:   Placement time:   Site:   Days:   Peripheral IV 10/01/18 Left Antecubital   10/01/18    2034    Antecubital   less  than 1   External Urinary Catheter   10/01/18    2045    -   less than 1   Airway   11/13/16    1002     687   Incision (Closed) 11/13/16 Leg Right   11/13/16    1048     687          Labs/Imaging Results for orders placed or performed during the hospital encounter of 10/01/18 (from the past 48 hour(s))  Lipase, blood     Status: None   Collection Time: 10/01/18  5:00 PM  Result Value Ref Range   Lipase 34 11 - 51 U/L    Comment: Performed at [facility], [place] [address], [place], [place] [address]  Comprehensive metabolic panel     Status: Abnormal   Collection Time: 10/01/18  5:00 PM  Result Value Ref Range   Sodium 132 (L) 135 - 145 mmol/L   Potassium 3.7 3.5 - 5.1 mmol/L   Chloride 100 98 - 111 mmol/L   CO2 22 22 - 32 mmol/L   Glucose, Bld 105 (H) 70 - 99 mg/dL   BUN 18 8 - 23 mg/dL   Creatinine, Ser 0.71 0.44 - 1.00 mg/dL   Calcium 8.0 (L) 8.9 -  10.3 mg/dL   Total Protein 6.3 (L) 6.5 - 8.1 g/dL   Albumin 2.5 (L) 3.5 - 5.0 g/dL   AST 71 (H) 15 - 41 U/L   ALT 47 (H) 0 - 44 U/L   Alkaline Phosphatase 147 (H) 38 - 126 U/L   Total Bilirubin 0.8 0.3 - 1.2 mg/dL   GFR calc non Af Amer >60 >60 mL/min   GFR calc Af Amer >60 >60 mL/min    Comment: (NOTE) The eGFR has been calculated using the CKD EPI equation. This calculation has not been validated in all clinical situations. eGFR's persistently <60 mL/min signify possible Chronic Kidney Disease.    Anion gap 10 5 - 15    Comment: Performed at [facility], [place] [address], [place], [place] [address]  CBC     Status: Abnormal   Collection Time: 10/01/18  5:00 PM  Result Value Ref Range   WBC 8.5 4.0 - 10.5 K/uL   RBC 3.18 (L) 3.87 - 5.11 MIL/uL   Hemoglobin 10.2 (L) 12.0 - 15.0 g/dL   HCT 32.3 (L) 36.0 - 46.0 %   MCV 101.6 (H) 80.0 - 100.0 fL   MCH 32.1 26.0 - 34.0 pg   MCHC 31.6 30.0 - 36.0 g/dL   RDW 12.2 11.5 - 15.5 %   Platelets 319 150 - 400 K/uL   nRBC 0.0 0.0 - 0.2 %     Comment: Performed at [facility], [place] [address] [place], [place] [address]  Brain natriuretic peptide     Status: Abnormal   Collection Time: 10/01/18  5:00 PM  Result Value Ref Range   B Natriuretic Peptide 109.8 (H) 0.0 - 100.0 pg/mL    Comment: Performed at [facility], [place] [address], [place], [place] [address]   Dg Chest 2 View  Result Date: 10/01/2018 CLINICAL DATA:  84 y/o F; 4-5 days of weakness. Shortness of breath, cough, rales. EXAM: CHEST - 2 VIEW COMPARISON:  10/31/2016 chest radiograph. FINDINGS: Stable mildly enlarged cardiac silhouette given projection and technique. Small right pleural effusion. Right lung base opacification. Left lung base platelike atelectasis. No pneumothorax. Multilevel degenerative changes of the spine. No acute osseous abnormality is evident. IMPRESSION: Small right pleural effusion and basilar opacity which may represent associated atelectasis or pneumonia. Electronically Signed   By: Kristine Garbe M.D.   On: 10/01/2018 19:25    Pending Labs Unresulted Labs (From admission, onward)    Start     Ordered   10/08/18 0500  Creatinine, serum  (enoxaparin (LOVENOX)    CrCl >/= 30 ml/min)  Weekly,   R    Comments:  while on enoxaparin therapy    10/01/18 2234   10/02/18 0160  Basic metabolic panel  Tomorrow morning,   R     10/01/18 2234   10/02/18 0500  CBC  Tomorrow morning,   R     10/01/18 2234   10/02/18 0500  Magnesium  Tomorrow morning,   R     10/01/18 2234   10/02/18 0500  Hepatic function panel  Tomorrow morning,   R     10/01/18 2234   10/01/18 2234  Vitamin B12  (Anemia Panel (PNL))  Once,   R     10/01/18 2234   10/01/18 2234  Folate  (Anemia Panel (PNL))  Once,   R     10/01/18 2234   10/01/18 2234  Iron and TIBC  (Anemia Panel (PNL))  Once,  R     10/01/18 2234   10/01/18 2234  Ferritin  (Anemia Panel (PNL))  Once,   R     10/01/18 2234   10/01/18 2234  Reticulocytes  (Anemia  Panel (PNL))  Once,   R     10/01/18 2234   10/01/18 2234  Troponin I  Once,   R     10/01/18 2234   10/01/18 2234  D-dimer, quantitative (not at [facility])  Once,   R     10/01/18 2234   10/01/18 2232  CBC  (enoxaparin (LOVENOX)    CrCl >/= 30 ml/min)  Once,   R    Comments:  Baseline for enoxaparin therapy IF NOT ALREADY DRAWN.  Notify MD if PLT < 100 K.    10/01/18 2234   10/01/18 2232  Creatinine, serum  (enoxaparin (LOVENOX)    CrCl >/= 30 ml/min)  Once,   R    Comments:  Baseline for enoxaparin therapy IF NOT ALREADY DRAWN.    10/01/18 2234   10/01/18 1606  Urinalysis, Routine w reflex microscopic  STAT,   STAT     10/01/18 1605          Vitals/Pain Today's Vitals   10/01/18 1830 10/01/18 1900 10/01/18 1930 10/01/18 2000  BP: 133/66 (!) 130/58 (!) 142/66 (!) 143/65  Pulse: 80 82 82 82  Resp: _0 Temp:      TempSrc:      SpO2: 97% 96% 96% 96%    Isolation Precautions No active isolations  Medications Medications  aspirin EC tablet 81 mg (has no administration in time range)  atorvastatin (LIPITOR) tablet 80 mg (has no administration in time range)  hydrochlorothiazide (HYDRODIURIL) tablet 25 mg (has no administration in time range)  metoprolol tartrate (LOPRESSOR) tablet 50 mg (has no administration in time range)  nitroGLYCERIN (NITROSTAT) SL tablet 0.4 mg (has no administration in time range)  irbesartan (AVAPRO) tablet 150 mg (has no administration in time range)  amitriptyline (ELAVIL) tablet 100 mg (has no administration in time range)  famotidine (PEPCID) tablet 20 mg (has no administration in time range)  ferrous gluconate (FERGON) tablet 240 mg (has no administration in time range)  fish oil-omega-3 fatty acids capsule 1 g (has no administration in time range)  potassium chloride SA (K-DUR,KLOR-CON) CR tablet 20 mEq (has no administration in time range)  acetaminophen (TYLENOL) tablet 650 mg (has no administration in time range)    Or  acetaminophen  (TYLENOL) suppository 650 mg (has no administration in time range)  ondansetron (ZOFRAN) tablet 4 mg (has no administration in time range)    Or  ondansetron (ZOFRAN) injection 4 mg (has no administration in time range)  enoxaparin (LOVENOX) injection 40 mg (has no administration in time range)  furosemide (LASIX) injection 40 mg (has no administration in time range)  furosemide (LASIX) injection 40 mg (40 mg Intravenous Given 10/01/18 2034)    Mobility walks with device

## 2018-10-01 NOTE — ED Provider Notes (Signed)
[facility] DEPT Provider Note   CSN: [id number] Arrival date & time: 10/01/18  1553   History   Chief Complaint Chief Complaint  Patient presents with  . Leg Swelling    HPI Rebecca Ford is a 84 y.o. female with a past medical history significant for CAD, hypertension who presents for evaluation of bilateral leg swelling and abdominal distention.  Daughter states that patient's legs have been progressively more swollen over the last 3 weeks.  States patient can barely walk as her legs have become so swollen.  Daughter states she has been in contact with patient's primary care provider who has written her prescription for Lasix.  Daughter states that patient leg edema has not changed since beginning the Lasix.  Admits to shortness of breath with exertion.  Admits to PND with mild orthopnea.  Denies history of known CHF or pulmonary edema.  Patient does have an appointment with cardiologist in 3 days and of her symptoms.  Per Note from PCP and Dr. Angelena Form, Cardiology patient was to be sent to the emergency department for evaluation.  Daughter states she noticed that patient's abdomen has been swollen.  Denies fever, chills, nausea, vomiting, chest pain, dysuria, diarrhea, constipation.  Patient daughter states she has been overall weak and feels dehydrated.  History obtained from patient and family members.  No interpreter was used.  HPI  Past Medical History:  Diagnosis Date  . Arthritis   . CAD (coronary artery disease)    s/p cath 09/07/10 w/3 vessel CAD, DES in RCA, Diagonal & LAD  . Dementia ([place])   . HTN (hypertension)   . Hyperlipemia   . MI (myocardial infarction) ([place])    Non-ST elevation 9/11  . Primary osteoarthritis of right knee 09/2016    Patient Active Problem List   Diagnosis Date Noted  . Chronic depression 12/03/2016  . Localized osteoarthritis of right knee 11/13/2016  . Primary osteoarthritis of right knee 1Mar 17, 202017  .  Syncope 11/16/2015  . 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 HYPERTENSION, BENIGN 09/20/2010  . CAD, NATIVE VESSEL 09/20/2010    Past Surgical History:  Procedure Laterality Date  . CARDIAC CATHETERIZATION     YRS AGO   . CATARACT EXTRACTION W/ INTRAOCULAR LENS  IMPLANT, BILATERAL    . PARTIAL HYSTERECTOMY    . RETINAL DETACHMENT SURGERY     LEFT  . TOTAL KNEE ARTHROPLASTY Right 11/13/2016   Procedure: TOTAL KNEE ARTHROPLASTY;  Surgeon: Frederik Pear, MD;  Location: [facility];  Service: Orthopedics;  Laterality: Right;     OB History   None      Home Medications    Prior to Admission medications   Medication Sig Start Date End Date Taking? Authorizing Provider  amitriptyline (ELAVIL) 100 MG tablet Take 100 mg by mouth at bedtime. 08/09/16   [provider]  amLODipine (NORVASC) 5 MG tablet Take 5 mg by mouth every other day.    [provider]  aspirin EC 81 MG tablet Take 1 tablet (81 mg total) by mouth daily. 05/03/17   Burnell Blanks, MD  atorvastatin (LIPITOR) 80 MG tablet Take 80 mg by mouth at bedtime.     [provider]  Cholecalciferol (VITAMIN D-3 PO) Take 1 tablet by mouth every morning.     [provider]  fish oil-omega-3 fatty acids 1000 MG capsule Take 1 g by mouth 2 (two) times daily.      [provider]  furosemide (LASIX) 20  MG tablet Take 20 mg by mouth daily.     [provider]  losartan-hydrochlorothiazide (HYZAAR) 100-25 MG per tablet Take 1 tablet by mouth every morning.  05/25/14   [provider]  metoprolol (LOPRESSOR) 50 MG tablet Take 50 mg by mouth 2 (two) times daily.  03/28/14   [provider]  Multiple Vitamins-Minerals (PRESERVISION AREDS PO) Take 1 tablet by mouth 2 (two) times daily.     [provider]  nitroGLYCERIN (NITROSTAT) 0.4 MG SL tablet Place 1 tablet (0.4 mg total) under the tongue every 5 (five) minutes as needed for chest pain. 06/04/14   Burnell Blanks, MD  oxyCODONE-acetaminophen (ROXICET) 5-325 MG tablet Take 1 tablet by mouth every 4 (four) hours as needed. 11/13/16   Leighton Parody, PA-C  polyethylene glycol [facility] / GLYCOLAX) packet Take 17 g by mouth daily.    [provider]  potassium chloride SA (K-DUR,KLOR-CON) 20 MEQ tablet Take 20 mEq by mouth daily. 11/01/16   [provider]  traMADol (ULTRAM) 50 MG tablet Take 1 tablet (50 mg total) by mouth every 12 (twelve) hours as needed. 08/23/16   Joy, Helane Gunther, PA-C    Family History Family History  Problem Relation Age of Onset  . Heart attack Mother   . Coronary artery disease Other        diagnosis of CAD but not premature diagnosis  . Coronary artery disease Brother     Social History Social History   Tobacco Use  . Smoking status: Never Smoker  . Smokeless tobacco: Never Used  Substance Use Topics  . Alcohol use: No  . Drug use: No     Allergies   Codeine   Review of Systems Review of Systems  Constitutional: Negative.   Respiratory: Positive for shortness of breath. Negative for apnea, cough, choking, chest tightness, wheezing and stridor.   Cardiovascular: Positive for leg swelling. Negative for chest pain and palpitations.  Gastrointestinal: Positive for abdominal distention. Negative for abdominal pain, anal bleeding, blood in stool, constipation, diarrhea, nausea, rectal pain and vomiting.  Genitourinary: Negative.   Musculoskeletal: Negative.   Skin: Negative.   Neurological: Negative.   All other systems reviewed and are negative.    Physical Exam Updated Vital Signs BP (!) 147/65 (BP Location: Right Arm)   Pulse 72   Temp 98.2 F (36.8 C) (Oral)   Resp 16   SpO2 100%   Physical Exam  Constitutional: No distress.  HENT:  Head: Atraumatic.  Mouth/Throat: Oropharynx is clear and moist.  Eyes: Pupils are equal, round, and reactive to light.  Neck: Normal range of motion.  Cardiovascular: Normal rate,  regular rhythm, normal heart sounds and intact distal pulses. Exam reveals no gallop and no friction rub.  No murmur heard. Pulses:      Dorsalis pedis pulses are 2+ on the right side, and 2+ on the left side.  Pulmonary/Chest: Effort normal. No stridor. No respiratory distress. She has no wheezes. She has no rhonchi. She has rales. She exhibits no tenderness.  Decreased lung sounds in bilateral lower bases.  Abdominal: Soft. Bowel sounds are normal. She exhibits distension. She exhibits no shifting dullness, no pulsatile liver, no fluid wave, no abdominal bruit, no ascites, no pulsatile midline mass and no mass. There is no hepatosplenomegaly. There is no tenderness. There is no rigidity, no rebound, no guarding, no CVA tenderness, no tenderness at McBurney's point and negative Murphy's sign. No hernia.  Musculoskeletal: Normal range of  motion.       Right foot: There is normal range of motion and no deformity.       Left foot: There is normal range of motion and no deformity.  Feet:  Right Foot:  Skin Integrity: Negative for ulcer, blister, skin breakdown, erythema or warmth.  Left Foot:  Skin Integrity: Negative for ulcer, blister, skin breakdown, erythema or warmth.  Neurological: She is alert.  Skin: Skin is warm and dry. She is not diaphoretic.  2+ bilateral pitting edema.  Psychiatric: She has a normal mood and affect.  Nursing note and vitals reviewed.    ED Treatments / Results  Labs (all labs ordered are listed, but only abnormal results are displayed) Labs Reviewed  COMPREHENSIVE METABOLIC PANEL - Abnormal; Notable for the following components:      Result Value   Sodium 132 (*)    Glucose, Bld 105 (*)    Calcium 8.0 (*)    Total Protein 6.3 (*)    Albumin 2.5 (*)    AST 71 (*)    ALT 47 (*)    Alkaline Phosphatase 147 (*)    All other components within normal limits  CBC - Abnormal; Notable for the following components:   RBC 3.18 (*)    Hemoglobin 10.2 (*)    HCT  32.3 (*)    MCV 101.6 (*)    All other components within normal limits  LIPASE, BLOOD  URINALYSIS, ROUTINE W REFLEX MICROSCOPIC    EKG None  Radiology No results found.  Procedures Procedures (including critical care time)  Medications Ordered in ED Medications - No data to display   Initial Impression / Assessment and Plan / ED Course  I have reviewed the triage vital signs and the nursing notes.  Pertinent labs & imaging results that were available during my care of the patient were reviewed by me and considered in my medical decision making (see chart for details).  84 year old female presents for evaluation of leg swelling and generalized weakness.  Slight crackles with bilateral rales right worse than left. 2+ bilateral lower extremity pitting edema. Has been seen at PCP for her edema and has been consistent with her Lasix however her leg swelling is not responding to the lasix over the last 2 weeks. Denies history of CHF. Admits to orthopnea. Symptoms with concern for new onset CHF. Chest xray with enlarged heart silhouette with right pleural effusion. Will give Lasix and admit for new onset CHF.   Labs without leukocytosis, hemoglobin 10.2, at baseline compared to previous labs, mild hyponatremia at 132,  BNP at 109.8, Lipase 34.  She is hemodynamically stable.   2030: Dr. Hal Hope with [facility] agree for admission for acute CHF.  Patient has been seen and evaluated by my attending, Dr. Johnney Killian, who agrees with above treatment, plan and disposition of patient.    Final Clinical Impressions(s) / ED Diagnoses   Final diagnoses:  Acute congestive heart failure, unspecified heart failure type [facility])    ED Discharge Orders    None       Escher Harr A, PA-C 10/01/18 2121    Charlesetta Shanks, MD 10/02/18 1729

## 2018-10-02 ENCOUNTER — Observation Stay (HOSPITAL_COMMUNITY): Payer: Medicare HMO

## 2018-10-02 ENCOUNTER — Encounter (HOSPITAL_COMMUNITY): Payer: Self-pay

## 2018-10-02 DIAGNOSIS — E876 Hypokalemia: Secondary | ICD-10-CM | POA: Diagnosis not present

## 2018-10-02 DIAGNOSIS — I5030 Unspecified diastolic (congestive) heart failure: Secondary | ICD-10-CM

## 2018-10-02 DIAGNOSIS — I110 Hypertensive heart disease with heart failure: Secondary | ICD-10-CM | POA: Diagnosis present

## 2018-10-02 DIAGNOSIS — D509 Iron deficiency anemia, unspecified: Secondary | ICD-10-CM | POA: Diagnosis present

## 2018-10-02 DIAGNOSIS — F329 Major depressive disorder, single episode, unspecified: Secondary | ICD-10-CM | POA: Diagnosis present

## 2018-10-02 DIAGNOSIS — E785 Hyperlipidemia, unspecified: Secondary | ICD-10-CM | POA: Diagnosis present

## 2018-10-02 DIAGNOSIS — I10 Essential (primary) hypertension: Secondary | ICD-10-CM | POA: Diagnosis not present

## 2018-10-02 DIAGNOSIS — I5031 Acute diastolic (congestive) heart failure: Secondary | ICD-10-CM | POA: Diagnosis not present

## 2018-10-02 DIAGNOSIS — I509 Heart failure, unspecified: Secondary | ICD-10-CM

## 2018-10-02 DIAGNOSIS — Z8249 Family history of ischemic heart disease and other diseases of the circulatory system: Secondary | ICD-10-CM | POA: Diagnosis not present

## 2018-10-02 DIAGNOSIS — I2510 Atherosclerotic heart disease of native coronary artery without angina pectoris: Secondary | ICD-10-CM | POA: Diagnosis present

## 2018-10-02 DIAGNOSIS — R531 Weakness: Secondary | ICD-10-CM | POA: Diagnosis present

## 2018-10-02 DIAGNOSIS — T502X5A Adverse effect of carbonic-anhydrase inhibitors, benzothiadiazides and other diuretics, initial encounter: Secondary | ICD-10-CM | POA: Diagnosis not present

## 2018-10-02 DIAGNOSIS — K219 Gastro-esophageal reflux disease without esophagitis: Secondary | ICD-10-CM | POA: Diagnosis not present

## 2018-10-02 DIAGNOSIS — I5033 Acute on chronic diastolic (congestive) heart failure: Secondary | ICD-10-CM | POA: Diagnosis present

## 2018-10-02 DIAGNOSIS — R1310 Dysphagia, unspecified: Secondary | ICD-10-CM | POA: Diagnosis present

## 2018-10-02 DIAGNOSIS — I4510 Unspecified right bundle-branch block: Secondary | ICD-10-CM | POA: Diagnosis present

## 2018-10-02 DIAGNOSIS — Z7983 Long term (current) use of bisphosphonates: Secondary | ICD-10-CM | POA: Diagnosis not present

## 2018-10-02 DIAGNOSIS — R55 Syncope and collapse: Secondary | ICD-10-CM | POA: Diagnosis present

## 2018-10-02 DIAGNOSIS — I252 Old myocardial infarction: Secondary | ICD-10-CM | POA: Diagnosis not present

## 2018-10-02 DIAGNOSIS — R140 Abdominal distension (gaseous): Secondary | ICD-10-CM | POA: Diagnosis present

## 2018-10-02 DIAGNOSIS — Z955 Presence of coronary angioplasty implant and graft: Secondary | ICD-10-CM | POA: Diagnosis not present

## 2018-10-02 DIAGNOSIS — K449 Diaphragmatic hernia without obstruction or gangrene: Secondary | ICD-10-CM | POA: Diagnosis present

## 2018-10-02 DIAGNOSIS — F0390 Unspecified dementia without behavioral disturbance: Secondary | ICD-10-CM | POA: Diagnosis present

## 2018-10-02 DIAGNOSIS — D539 Nutritional anemia, unspecified: Secondary | ICD-10-CM | POA: Diagnosis present

## 2018-10-02 DIAGNOSIS — E46 Unspecified protein-calorie malnutrition: Secondary | ICD-10-CM | POA: Diagnosis present

## 2018-10-02 DIAGNOSIS — Z6824 Body mass index (BMI) 24.0-24.9, adult: Secondary | ICD-10-CM | POA: Diagnosis not present

## 2018-10-02 DIAGNOSIS — K224 Dyskinesia of esophagus: Secondary | ICD-10-CM | POA: Diagnosis not present

## 2018-10-02 DIAGNOSIS — R7989 Other specified abnormal findings of blood chemistry: Secondary | ICD-10-CM | POA: Diagnosis not present

## 2018-10-02 LAB — URINALYSIS, ROUTINE W REFLEX MICROSCOPIC
Bilirubin Urine: NEGATIVE
Glucose, UA: NEGATIVE mg/dL
HGB URINE DIPSTICK: NEGATIVE
Ketones, ur: NEGATIVE mg/dL
Leukocytes, UA: NEGATIVE
Nitrite: NEGATIVE
PROTEIN: NEGATIVE mg/dL
Specific Gravity, Urine: 1.005 (ref 1.005–1.030)
pH: 7 (ref 5.0–8.0)

## 2018-10-02 LAB — BASIC METABOLIC PANEL
Anion gap: 10 (ref 5–15)
BUN: 15 mg/dL (ref 8–23)
CALCIUM: 7.8 mg/dL — AB (ref 8.9–10.3)
CHLORIDE: 98 mmol/L (ref 98–111)
CO2: 24 mmol/L (ref 22–32)
CREATININE: 0.65 mg/dL (ref 0.44–1.00)
GFR calc Af Amer: >60 mL/min (ref >60)
GFR calc non Af Amer: >60 mL/min (ref >60)
Glucose, Bld: 91 mg/dL (ref 70–99)
Potassium: 3.5 mmol/L (ref 3.5–5.1)
Sodium: 132 mmol/L — ABNORMAL LOW (ref 135–145)

## 2018-10-02 LAB — HEPATIC FUNCTION PANEL
ALBUMIN: 2.3 g/dL — AB (ref 3.5–5.0)
ALK PHOS: 137 U/L — AB (ref 38–126)
ALT: 42 U/L (ref 0–44)
AST: 67 U/L — ABNORMAL HIGH (ref 15–41)
BILIRUBIN DIRECT: 0.2 mg/dL (ref 0.0–0.2)
BILIRUBIN INDIRECT: 0.7 mg/dL (ref 0.3–0.9)
BILIRUBIN TOTAL: 0.9 mg/dL (ref 0.3–1.2)
Total Protein: 5.9 g/dL — ABNORMAL LOW (ref 6.5–8.1)

## 2018-10-02 LAB — CBC
HEMATOCRIT: 31.7 % — AB (ref 36.0–46.0)
Hemoglobin: 10.3 g/dL — ABNORMAL LOW (ref 12.0–15.0)
MCH: 32.2 pg (ref 26.0–34.0)
MCHC: 32.5 g/dL (ref 30.0–36.0)
MCV: 99.1 fL (ref 80.0–100.0)
Platelets: 289 10*3/uL (ref 150–400)
RBC: 3.2 MIL/uL — ABNORMAL LOW (ref 3.87–5.11)
RDW: 12.1 % (ref 11.5–15.5)
WBC: 7.5 10*3/uL (ref 4.0–10.5)
nRBC: 0 % (ref 0.0–0.2)

## 2018-10-02 LAB — IRON AND TIBC
Iron: 26 ug/dL — ABNORMAL LOW (ref 28–170)
SATURATION RATIOS: 13 % (ref 10.4–31.8)
TIBC: 194 ug/dL — ABNORMAL LOW (ref 250–450)
UIBC: 168 ug/dL

## 2018-10-02 LAB — TROPONIN I: Troponin I: <0.03 ng/mL (ref <0.03)

## 2018-10-02 LAB — FOLATE: FOLATE: 5.8 ng/mL — AB (ref >5.9)

## 2018-10-02 LAB — PROCALCITONIN

## 2018-10-02 LAB — FERRITIN: FERRITIN: 254 ng/mL (ref 11–307)

## 2018-10-02 LAB — VITAMIN B12: Vitamin B-12: 617 pg/mL (ref 180–914)

## 2018-10-02 LAB — MAGNESIUM: Magnesium: 1.5 mg/dL — ABNORMAL LOW (ref 1.7–2.4)

## 2018-10-02 MED ORDER — IOPAMIDOL (ISOVUE-370) INJECTION 76%
INTRAVENOUS | Status: AC
  Filled 2018-10-02: qty 100

## 2018-10-02 MED ORDER — ENSURE ENLIVE PO LIQD
237.0000 mL | ORAL | Status: DC
  Administered 2018-10-02 – 2018-10-07 (×5): 237 mL via ORAL

## 2018-10-02 MED ORDER — IOPAMIDOL (ISOVUE-370) INJECTION 76%
100.0000 mL | Freq: Once | INTRAVENOUS | Status: AC | PRN
  Administered 2018-10-02: 100 mL via INTRAVENOUS

## 2018-10-02 MED ORDER — HYDRALAZINE HCL 20 MG/ML IJ SOLN: 10.0000 mg | INTRAMUSCULAR | Status: DC | PRN

## 2018-10-02 MED ORDER — SODIUM CHLORIDE 0.9 % IJ SOLN
INTRAMUSCULAR | Status: AC
  Filled 2018-10-02: qty 50

## 2018-10-02 NOTE — Progress Notes (Signed)
PROGRESS NOTE    Rebecca Ford  CXK:[id number] DOB: 09/07/1930 DOA: 10/01/2018 PCP: [name], MD    Brief Narrative:  84 year old female who presented with lower extremity edema and dyspnea.  She does have history of coronary artery disease, hypertension, and dyslipidemia.  Reported few weeks of generalized weakness, she was diagnosed with anemia as an outpatient, and prescribed iron tablets.  Her symptoms kept worsening, now associated lower extremity edema and increased abdominal girth.  On the initial physical examination blood pressure 133/66, heart rate 80, respiratory rate 16, oxygen saturation 97%.  Positive JVD, lungs with bilateral rails, heart S1-S2 present and rhythmic, abdomen soft, distended, no guarding, positive lower extremity edema.  Patient was admitted to the hospital with working diagnosis of decompensated heart failure.   Assessment & Plan:   Principal Problem:   Acute CHF (congestive heart failure) (HCC) Active Problems:   HYPERTENSION, BENIGN   CAD, NATIVE VESSEL   Syncope   Macrocytic anemia   CHF (congestive heart failure) ([place])   1. Acute on chronic decompensated heart failure. Will continue aggressive diuresis with furosemide 40 mg IV q12h, to target a negative fluid balance, urine output over last 24 hours 800 ml. Continue metoprolol and irbesartan, follow on echocardiography.   2. CAD. No active chest pain, continue aspirin and atorvastatin.   3. Iron deficiency. Continue iron supplementation, no indication for prbc transfusion.   4. Depression. Continue amitriptyline.    DVT prophylaxis: enoxaparin   Code Status: full Family Communication: I spoke with patient's daughter at the bedside and all questions were addressed.  Disposition Plan/ discharge barriers:  Pending clinical improvement    Consultants:     Procedures:     Antimicrobials:       Subjective: Patient continue to have dyspnea and edema, improved symptoms but not  back at baseline. At home had confusion.   Objective: Vitals:   10/01/18 2000 10/01/18 2328 10/02/18 0420 10/02/18 1407  BP: (!) 143/65 (!) 208/85 132/64 130/70  Pulse: 82 96 78 75  Resp: 17 18 16 16   Temp:  98.3 F (36.8 C) 98 F (36.7 C) 98.8 F (37.1 C)  TempSrc:  Oral Oral Oral  SpO2: 96% 100% 96% 97%  Weight:  65 kg 64.3 kg   Height:  5\' 2"  (1.575 m)      Intake/Output Summary (Last 24 hours) at 10/02/2018 1704 Last data filed at 10/02/2018 1410 Gross per 24 hour  Intake 120 ml  Output 1400 ml  Net -1280 ml   Filed Weights   10/01/18 2328 10/02/18 0420  Weight: 65 kg 64.3 kg    Examination:   General: deconditioned and ill looking appearing  Neurology: Awake and alert, non focal  E ENT: no pallor, no icterus, oral mucosa moist Cardiovascular: mild JVD. S1-S2 present, rhythmic, no gallops, rubs, or murmurs. +++ pitting lower extremity edema. Pulmonary: positive breath sounds bilaterally, decreased air movement, no wheezing or rhonchi, positive bilateral rales. Gastrointestinal. Abdomen witth no organomegaly, non tender, no rebound or guarding Skin. No rashes Musculoskeletal: no joint deformities     Data Reviewed: I have personally reviewed following labs and imaging studies  CBC: Recent Labs  Lab 10/01/18 1700 10/02/18 0422  WBC 8.5 7.5  HGB 10.2* 10.3*  HCT 32.3* 31.7*  MCV 101.6* 99.1  PLT 319 970   Basic Metabolic Panel: Recent Labs  Lab 10/01/18 1700 10/02/18 0422  NA 132* 132*  K 3.7 3.5  CL 100 98  CO2 22 24  GLUCOSE 105* 91  BUN 18 15  CREATININE 0.71 0.65  CALCIUM 8.0* 7.8*  MG  --  1.5*   GFR: Estimated Creatinine Clearance: 44.5 mL/min (by C-G formula based on SCr of 0.65 mg/dL). Liver Function Tests: Recent Labs  Lab 10/01/18 1700 10/02/18 0422  AST 71* 67*  ALT 47* 42  ALKPHOS 147* 137*  BILITOT 0.8 0.9  PROT 6.3* 5.9*  ALBUMIN 2.5* 2.3*   Recent Labs  Lab 10/01/18 1700  LIPASE 34   No results for input(s):  AMMONIA in the last 168 hours. Coagulation Profile: No results for input(s): INR, PROTIME in the last 168 hours. Cardiac Enzymes: Recent Labs  Lab 10/01/18 2330  TROPONINI <0.03   BNP (last 3 results) No results for input(s): PROBNP in the last 8760 hours. HbA1C: No results for input(s): HGBA1C in the last 72 hours. CBG: No results for input(s): GLUCAP in the last 168 hours. Lipid Profile: No results for input(s): CHOL, HDL, LDLCALC, TRIG, CHOLHDL, LDLDIRECT in the last 72 hours. Thyroid Function Tests: No results for input(s): TSH, T4TOTAL, FREET4, T3FREE, THYROIDAB in the last 72 hours. Anemia Panel: Recent Labs    10/01/18 2330  VITAMINB12 617  FOLATE 5.8*  FERRITIN 254  TIBC 194*  IRON 26*  RETICCTPCT 1.6      Radiology Studies: I have reviewed all of the imaging during this hospital visit personally     Scheduled Meds: . amitriptyline  100 mg Oral QHS  . aspirin EC  81 mg Oral Daily  . atorvastatin  80 mg Oral QHS  . enoxaparin (LOVENOX) injection  40 mg Subcutaneous QHS  . famotidine  20 mg Oral QHS  . feeding supplement (ENSURE ENLIVE)  237 mL Oral Q24H  . ferrous gluconate  324 mg Oral Q breakfast  . furosemide  40 mg Intravenous Q12H  . hydrochlorothiazide  25 mg Oral q morning - 10a  . iopamidol      . irbesartan  150 mg Oral Daily  . metoprolol tartrate  50 mg Oral BID  . omega-3 acid ethyl esters  1 g Oral BID  . potassium chloride SA  20 mEq Oral Daily  . sodium chloride       Continuous Infusions:   LOS: 0 days        Mauricio Gerome Apley, MD [facility] Pager [phone]

## 2018-10-02 NOTE — Progress Notes (Signed)
Initial Nutrition Assessment  DOCUMENTATION CODES:   (Will assess for malnutrition at follow-up. )  INTERVENTION:  - Will order Ensure Enlive once/day, this supplement provides 350 kcal and 20 grams of protein. - Continue to encourage PO intakes. - Will attempt NFPE at follow-up.    NUTRITION DIAGNOSIS:   Inadequate oral intake related to acute illness as evidenced by meal completion < 50%.  GOAL:   Patient will meet greater than or equal to 90% of their needs  MONITOR:   PO intake, Supplement acceptance, Weight trends, Labs  REASON FOR ASSESSMENT:   Malnutrition Screening Tool  ASSESSMENT:   84 y.o. female with history of CAD s/p stenting in 2011, HTN, syncope, and hyperlipidemia. She has been feeling weak and fatigued for past few weeks and had gone to her PCP and was found to be anemic and was prescribed iron pills. Patient was instructed to stay well-hydrated. Over the last 1 week patient has been having increasing lower extremity edema, increased abdominal girth, and easily gets SOB with minimal exertion to the point that patient is not even able to tie her shoes.  BMI indicates overweight status, appropriate for advanced age. No intakes documented since admission. Patient and a visitor sleeping and another visitor sitting in chair, awake, at the time of RD visit. Visitor in the chair requests that patient not be awoken and that RD stop another time.   Spoke with RN outside of patient's room. She denies patient being SOB or complaining of being SOB during this shift. She states that patient ate all of toast and ~50% each of scrambled egg, grits, and fruit for breakfast. RN reports that patient is now NPO for ultrasound.   Per review of MST, patient had reported decreased appetite and unsure about weight changes PTA. Per chart review, weight has been stable since 04/2017.   Medications reviewed; 20 mg oral Pepcid/day, 40 mg IV Lasix BID, 25 mg Hydrodiuril/day, 20 mEq  K-Dur/day.  Labs reviewed; Na: 132 mmol/L, Ca: 7.8 mg/dL, Mg: 1.5 mg/dL, AST elevated.      NUTRITION - FOCUSED PHYSICAL EXAM:  Will attempt at follow-up.   Diet Order:   Diet Order            Diet Heart Room service appropriate? Yes; Fluid consistency: Thin; Fluid restriction: 1200 mL Fluid  Diet effective now              EDUCATION NEEDS:   Not appropriate for education at this time  Skin:  Skin Assessment: Reviewed RN Assessment  Last BM:  PTA/unknown  Height:   Ht Readings from Last 1 Encounters:  10/01/18 5\' 2"  (1.575 m)    Weight:   Wt Readings from Last 1 Encounters:  10/02/18 64.3 kg    Ideal Body Weight:  50 kg  BMI:  Body mass index is 25.93 kg/m.  Estimated Nutritional Needs:   Kcal:  1475-1610  Protein:  55-65 grams  Fluid:  >/= 1.5 L/day     Jarome Matin, MS, RD, LDN, [facility] Inpatient Clinical Dietitian Pager # [phone] After hours/weekend pager # [phone]

## 2018-10-02 NOTE — Progress Notes (Signed)
PROGRESS NOTE    Rebecca Ford   QQP:[id number]  DOB: 02/06/1930 DOA: 10/01/2018 PCP: [name], MD  Outpatient Specialists: [facility] speciality and name if known)    Brief Narrative: Rebecca Ford is a 84 y.o female, admitted to the hospital because of CHF. She has a history of CAD, HTN, syncope, hyperlipidemia, and macrocytic anemia. Patient has been feeling increased bilateral leg edema, abdominal distention, and SOB over the last week. Patient has been taking Lasix prescribed by her primary care provider with no improvement. CT angio chest showed a moderate right and a small left pleural effusion as well as ascites in the upper abdomen. Labs were positive for macrocytic anemia.   Assessment & Plan:   1. Acute CHF: Patient seems to be responding to medication . Lower leg edema and abdominal distention are lowering down, with net out put of -880 ml in the last 24 hrs. However, patient continues to have SOB and cough. Echo test is done this morning, result pending. Continue on lasix 40 mg BID.  2. CAD status post stent: Patient do not have active symptoms such as chest pain. Also, cardiac enzymes are not elevated.  Continue on metoprolol, Asprin, and atorvastatin.  3. Hypertension: Controlled, continue metoprolol.   4. Macrocytic anemia: Patient is on iron pills per PCP order. Continue her on Iron pills. Moniter CBC.   5. Dysphasia: Patient indicated that she has been having pain with swallowing solid starting 3-4 days ago. No treatment is required as this point, will investing further if it progress.   DVT prophylaxis: Lovenox  Code Status:  Full code  Family Communication:  [name] was present at bed side.   Disposition Plan:    Consultants:  None  Procedures:  Echo, CT angio chest  Antimicrobials:  None  Subjective: Patient still have SOB. Reported that she has been cough continuously through the night. The cough was non-productive. Denies chest pain.  Admitted that she has dysphagia with swallowing solid food.    Objective: Vitals:   10/01/18 1930 10/01/18 2000 10/01/18 2328 10/02/18 0420  BP: (!) 142/66 (!) 143/65 (!) 208/85 132/64  Pulse: 82 82 96 78  Resp: 18 17 18 16   Temp:   98.3 F (36.8 C) 98 F (36.7 C)  TempSrc:   Oral Oral  SpO2: 96% 96% 100% 96%  Weight:   65 kg 64.3 kg  Height:   5\' 2"  (1.575 m)     Intake/Output Summary (Last 24 hours) at 10/02/2018 1255 Last data filed at 10/02/2018 0736 Gross per 24 hour  Intake 120 ml  Output 1000 ml  Net -880 ml   Filed Weights   10/01/18 2328 10/02/18 0420  Weight: 65 kg 64.3 kg    Examination:  General exam: Appears calm, but with mild discomfort Respiratory system: Clear to auscultation. Respiratory effort normal. Cardiovascular system: S1 & S2 heard, RRR. mild JVD, no murmurs, rubs, gallops or clicks. No pedal edema. Gastrointestinal system: Abdomen is distended. No organomegaly or masses felt. Normal bowel sounds heard. Central nervous system: Alert and oriented. No focal neurological deficits. Extremities: Symmetric 5 x 5 power. 2+ bilateral edema on lower exeremities Skin: No rashes, lesions or ulcers Psychiatry: Judgement and insight appear normal. Mood & affect appropriate.     Data Reviewed: I have personally reviewed following labs and imaging studies  CBC: Recent Labs  Lab 10/01/18 1700 10/02/18 0422  WBC 8.5 7.5  HGB 10.2* 10.3*  HCT 32.3* 31.7*  MCV 101.6* 99.1  PLT 319 161   Basic Metabolic Panel: Recent Labs  Lab 10/01/18 1700 10/02/18 0422  NA 132* 132*  K 3.7 3.5  CL 100 98  CO2 22 24  GLUCOSE 105* 91  BUN 18 15  CREATININE 0.71 0.65  CALCIUM 8.0* 7.8*  MG  --  1.5*   GFR: Estimated Creatinine Clearance: 44.5 mL/min (by C-G formula based on SCr of 0.65 mg/dL). Liver Function Tests: Recent Labs  Lab 10/01/18 1700 10/02/18 0422  AST 71* 67*  ALT 47* 42  ALKPHOS 147* 137*  BILITOT 0.8 0.9  PROT 6.3* 5.9*  ALBUMIN  2.5* 2.3*   Recent Labs  Lab 10/01/18 1700  LIPASE 34   No results for input(s): AMMONIA in the last 168 hours. Coagulation Profile: No results for input(s): INR, PROTIME in the last 168 hours. Cardiac Enzymes: Recent Labs  Lab 10/01/18 2330  TROPONINI <0.03   BNP (last 3 results) No results for input(s): PROBNP in the last 8760 hours. HbA1C: No results for input(s): HGBA1C in the last 72 hours. CBG: No results for input(s): GLUCAP in the last 168 hours. Lipid Profile: No results for input(s): CHOL, HDL, LDLCALC, TRIG, CHOLHDL, LDLDIRECT in the last 72 hours. Thyroid Function Tests: No results for input(s): TSH, T4TOTAL, FREET4, T3FREE, THYROIDAB in the last 72 hours. Anemia Panel: Recent Labs    10/01/18 2330  VITAMINB12 617  FOLATE 5.8*  FERRITIN 254  TIBC 194*  IRON 26*  RETICCTPCT 1.6   Urine analysis:    Component Value Date/Time   COLORURINE STRAW (A) 10/01/2018 2338   APPEARANCEUR CLEAR 10/01/2018 2338   LABSPEC 1.005 10/01/2018 2338   PHURINE 7.0 10/01/2018 [place] 10/01/2018 [place] 10/01/2018 2338   [place] 10/01/2018 [place] 10/01/2018 2338   PROTEINUR NEGATIVE 10/01/2018 2338   NITRITE NEGATIVE 10/01/2018 2338   LEUKOCYTESUR NEGATIVE 10/01/2018 2338   Sepsis Labs: @LABRCNTIP (procalcitonin:4,lacticidven:4)  )No results found for this or any previous visit (from the past 240 hour(s)).       Radiology Studies: Dg Chest 2 View  Result Date: 10/01/2018 CLINICAL DATA:  84 y/o F; 4-5 days of weakness. Shortness of breath, cough, rales. EXAM: CHEST - 2 VIEW COMPARISON:  10/31/2016 chest radiograph. FINDINGS: Stable mildly enlarged cardiac silhouette given projection and technique. Small right pleural effusion. Right lung base opacification. Left lung base platelike atelectasis. No pneumothorax. Multilevel degenerative changes of the spine. No acute osseous abnormality is evident.  IMPRESSION: Small right pleural effusion and basilar opacity which may represent associated atelectasis or pneumonia. Electronically Signed   By: Kristine Garbe M.D.   On: 10/01/2018 19:25   Ct Angio Chest Pe W Or Wo Contrast  Result Date: 10/02/2018 CLINICAL DATA:  84 year old with bilateral leg edema and fatigue. Elevated D-dimer. EXAM: CT ANGIOGRAPHY CHEST WITH CONTRAST TECHNIQUE: Multidetector CT imaging of the chest was performed using the standard protocol during bolus administration of intravenous contrast. Multiplanar CT image reconstructions and MIPs were obtained to evaluate the vascular anatomy. CONTRAST:  111mL ISOVUE-370 IOPAMIDOL (ISOVUE-370) INJECTION 76% COMPARISON:  Chest radiograph earlier this day. FINDINGS: Cardiovascular: There are no filling defects within the pulmonary arteries to suggest pulmonary embolus. Aortic atherosclerosis and tortuosity without dissection. Conventional branching pattern from the aortic arch. Mild multi chamber cardiomegaly. There are coronary artery calcifications. No significant pericardial effusion. Mediastinum/Nodes: Multiple small epicardial nodes, nonspecific. No enlarged mediastinal or hilar lymph nodes. Small to moderate hiatal hernia. Upper esophagus is patulous.  3 cm right thyroid or parathyroid nodule. Enlargement of the right thyroid lobe compared to left. Lungs/Pleura: Moderate right pleural effusion is partially loculated. Associated compressive atelectasis in the right lower and middle lobes. There is a small left pleural effusion with adjacent atelectasis. Vague ground-glass opacities with minimal septal thickening at the apices. No suspicious pulmonary mass. Upper Abdomen: Upper abdominal ascites adjacent to the liver and spleen. Musculoskeletal: Lucency in the upper sternal body with surrounding calcification, suggesting subacute fracture. Degenerative change in the spine. Review of the MIP images confirms the above findings. IMPRESSION:  1. No pulmonary embolus. 2. Moderate right pleural effusion, partially loculated. There is adjacent compressive atelectasis. Small left pleural effusion. 3. Minimal ground-glass opacities and septal thickening at the apices, possible early pulmonary edema. Multi chamber cardiomegaly. 4. Ascites in the upper abdomen. 5. Sternal lucency with surrounding calcifications, likely subacute or remote fracture. Focal lytic bone lesion is not excluded on imaging findings alone, but felt less likely particularly in the absence of known malignancy. 6. A 3 cm hypodense nodule abutting the right lobe of the thyroid gland may be an exophytic thyroid nodule or parathyroid nodule. Thyroid ultrasound may be of value, giving consideration for patient's advanced age. 7. Aortic Atherosclerosis (ICD10-I70.0). Coronary artery calcifications. Electronically Signed   By: Keith Rake M.D.   On: 10/02/2018 05:51        Scheduled Meds: . amitriptyline  100 mg Oral QHS  . aspirin EC  81 mg Oral Daily  . atorvastatin  80 mg Oral QHS  . enoxaparin (LOVENOX) injection  40 mg Subcutaneous QHS  . famotidine  20 mg Oral QHS  . feeding supplement (ENSURE ENLIVE)  237 mL Oral Q24H  . ferrous gluconate  324 mg Oral Q breakfast  . furosemide  40 mg Intravenous Q12H  . hydrochlorothiazide  25 mg Oral q morning - 10a  . iopamidol      . irbesartan  150 mg Oral Daily  . metoprolol tartrate  50 mg Oral BID  . omega-3 acid ethyl esters  1 g Oral BID  . potassium chloride SA  20 mEq Oral Daily  . sodium chloride       Continuous Infusions:   LOS: 0 days    Time spent:     Sterling Big, MD [facility] Pager [phone]  If 7PM-7AM, please contact night-coverage [email or web address] Password [facility] 10/02/2018, 12:55 PM

## 2018-10-02 NOTE — Progress Notes (Signed)
  Echocardiogram 2D Echocardiogram has been performed.  Tiondra Fang L Androw 10/02/2018, 12:51 PM

## 2018-10-03 LAB — BASIC METABOLIC PANEL
Anion gap: 12 (ref 5–15)
BUN: 20 mg/dL (ref 8–23)
CHLORIDE: 97 mmol/L — AB (ref 98–111)
CO2: 25 mmol/L (ref 22–32)
Calcium: 8 mg/dL — ABNORMAL LOW (ref 8.9–10.3)
Creatinine, Ser: 0.83 mg/dL (ref 0.44–1.00)
GFR calc non Af Amer: >60 mL/min (ref >60)
Glucose, Bld: 108 mg/dL — ABNORMAL HIGH (ref 70–99)
POTASSIUM: 3.2 mmol/L — AB (ref 3.5–5.1)
SODIUM: 134 mmol/L — AB (ref 135–145)

## 2018-10-03 LAB — ECHOCARDIOGRAM COMPLETE
Height: 62 in
WEIGHTICAEL: 2268.09 [oz_av]

## 2018-10-03 MED ORDER — FUROSEMIDE 10 MG/ML IJ SOLN
40.0000 mg | Freq: Every day | INTRAMUSCULAR | Status: DC
  Administered 2018-10-04: 40 mg via INTRAVENOUS
  Filled 2018-10-03: qty 4

## 2018-10-03 MED ORDER — SODIUM CHLORIDE 0.9 % IV SOLN
510.0000 mg | Freq: Once | INTRAVENOUS | Status: AC
  Administered 2018-10-03: 510 mg via INTRAVENOUS
  Filled 2018-10-03: qty 17

## 2018-10-03 MED ORDER — POTASSIUM CHLORIDE 20 MEQ/15ML (10%) PO SOLN
40.0000 meq | ORAL | Status: AC
  Administered 2018-10-03 (×2): 40 meq via ORAL
  Filled 2018-10-03 (×2): qty 30

## 2018-10-03 MED ORDER — SUCRALFATE 1 G PO TABS
1.0000 g | ORAL_TABLET | Freq: Three times a day (TID) | ORAL | Status: DC
  Administered 2018-10-03 – 2018-10-07 (×18): 1 g via ORAL
  Filled 2018-10-03 (×18): qty 1

## 2018-10-03 NOTE — Plan of Care (Signed)
  Problem: Education: Goal: Knowledge of General Education information will improve Description Including pain rating scale, medication(s)/side effects and non-pharmacologic comfort measures Outcome: Progressing   Problem: Clinical Measurements: Goal: Will remain free from infection Outcome: Progressing Goal: Respiratory complications will improve Outcome: Progressing Goal: Cardiovascular complication will be avoided Outcome: Progressing   Problem: Activity: Goal: Risk for activity intolerance will decrease Outcome: Progressing   Problem: Nutrition: Goal: Adequate nutrition will be maintained Outcome: Progressing   Problem: Elimination: Goal: Will not experience complications related to bowel motility Outcome: Progressing Goal: Will not experience complications related to urinary retention Outcome: Progressing   Problem: Safety: Goal: Ability to remain free from injury will improve Outcome: Progressing   Problem: Skin Integrity: Goal: Risk for impaired skin integrity will decrease Outcome: Progressing

## 2018-10-03 NOTE — Progress Notes (Signed)
PROGRESS NOTE    Rebecca Ford  PHX:[id number] DOB: 1930-02-08 DOA: 10/01/2018 PCP: [name], MD    Brief Narrative:  84 year old female who presented with lower extremity edema and dyspnea.  She does have history of coronary artery disease, hypertension, and dyslipidemia.  Reported few weeks of generalized weakness, she was diagnosed with anemia as an outpatient, and prescribed iron tablets.  Her symptoms kept worsening, now associated lower extremity edema and increased abdominal girth.  On the initial physical examination blood pressure 133/66, heart rate 80, respiratory rate 16, oxygen saturation 97%.  Positive JVD, lungs with bilateral rails, heart S1-S2 present and rhythmic, abdomen soft, distended, no guarding, positive lower extremity edema.  Patient was admitted to the hospital with working diagnosis of decompensated heart failure.   Assessment & Plan:   Principal Problem:   Acute CHF (congestive heart failure) (HCC) Active Problems:   HYPERTENSION, BENIGN   CAD, NATIVE VESSEL   Syncope   Macrocytic anemia   CHF (congestive heart failure) ([place])   1. Acute on chronic decompensated heart failure. Continue heart failure management with metoprolol and irbesartan. Improved volume status, will decrease furosemide to 40 mg Iv daily. Out of bed as tolerated and physical therapy evaluation. Urine output over last 24 hours 1,700 ml.   2. CAD. On aspirin and atorvastatin, patient has remained chest pain free.   3. Iron deficiency. Patient with persistent deficiency despite oral supplementation per many months per her daughter report at the bedside, will order one dose IV Iron.   4. Depression. Reported confusion but no agitation, will continue neuro checks per unit protocol, continue amitriptyline.   5. HTN. Continue HTCZ and irbesartan for blood pressure control. Systolic blood pressure 016 mmHg.   6. Hypokalemia. Likely due to diuretic use, will correct with Kcl 80 meq  in 2 divided doses. Renal function is preserved, follow on renal panel in am.   DVT prophylaxis: enoxaparin   Code Status: full Family Communication: I spoke with patient's daughter at the bedside and all questions were addressed.  Disposition Plan/ discharge barriers:  Pending clinical improvement, follow with physical therapy evaluation.     Consultants:     Procedures:     Antimicrobials:      Subjective: Dyspnea has improved along with lower extremity edema, but not back to baseline, positive confusion and generalized weakness, not able to swallow due to pain and reflux.   Objective: Vitals:   10/02/18 1407 10/02/18 2216 10/03/18 0641 10/03/18 0937  BP: 130/70 (!) 125/58 (!) 109/92 135/66  Pulse: 75 98 88 90  Resp: 16 20 18 16   Temp: 98.8 F (37.1 C) 98.3 F (36.8 C) 98.8 F (37.1 C) 98.4 F (36.9 C)  TempSrc: Oral Oral Oral Oral  SpO2: 97% 96% 95% 94%  Weight:      Height:        Intake/Output Summary (Last 24 hours) at 10/03/2018 1253 Last data filed at 10/03/2018 0700 Gross per 24 hour  Intake 100 ml  Output 1300 ml  Net -1200 ml   Filed Weights   10/01/18 2328 10/02/18 0420  Weight: 65 kg 64.3 kg    Examination:   General: deconditioned  Neurology: Awake and alert, non focal  E ENT: mild pallor, no icterus, oral mucosa moist Cardiovascular: No JVD. S1-S2 present, rhythmic, no gallops, rubs, or murmurs. +/++ lower extremity edema. Pulmonary: positive breath sounds bilaterally, adequate air movement, no wheezing,or rhonchi, mild bibasilar rales. Gastrointestinal. Abdomen mild distended, no organomegaly, non tender,  no rebound or guarding Skin. No rashes Musculoskeletal: no joint deformities     Data Reviewed: I have personally reviewed following labs and imaging studies  CBC: Recent Labs  Lab 10/01/18 1700 10/02/18 0422  WBC 8.5 7.5  HGB 10.2* 10.3*  HCT 32.3* 31.7*  MCV 101.6* 99.1  PLT 319 263   Basic Metabolic  Panel: Recent Labs  Lab 10/01/18 1700 10/02/18 0422 10/03/18 0411  NA 132* 132* 134*  K 3.7 3.5 3.2*  CL 100 98 97*  CO2 22 24 25   GLUCOSE 105* 91 108*  BUN 18 15 20   CREATININE 0.71 0.65 0.83  CALCIUM 8.0* 7.8* 8.0*  MG  --  1.5*  --    GFR: Estimated Creatinine Clearance: 42.9 mL/min (by C-G formula based on SCr of 0.83 mg/dL). Liver Function Tests: Recent Labs  Lab 10/01/18 1700 10/02/18 0422  AST 71* 67*  ALT 47* 42  ALKPHOS 147* 137*  BILITOT 0.8 0.9  PROT 6.3* 5.9*  ALBUMIN 2.5* 2.3*   Recent Labs  Lab 10/01/18 1700  LIPASE 34   No results for input(s): AMMONIA in the last 168 hours. Coagulation Profile: No results for input(s): INR, PROTIME in the last 168 hours. Cardiac Enzymes: Recent Labs  Lab 10/01/18 2330  TROPONINI <0.03   BNP (last 3 results) No results for input(s): PROBNP in the last 8760 hours. HbA1C: No results for input(s): HGBA1C in the last 72 hours. CBG: No results for input(s): GLUCAP in the last 168 hours. Lipid Profile: No results for input(s): CHOL, HDL, LDLCALC, TRIG, CHOLHDL, LDLDIRECT in the last 72 hours. Thyroid Function Tests: No results for input(s): TSH, T4TOTAL, FREET4, T3FREE, THYROIDAB in the last 72 hours. Anemia Panel: Recent Labs    10/01/18 2330  VITAMINB12 617  FOLATE 5.8*  FERRITIN 254  TIBC 194*  IRON 26*  RETICCTPCT 1.6      Radiology Studies: I have reviewed all of the imaging during this hospital visit personally     Scheduled Meds: . amitriptyline  100 mg Oral QHS  . aspirin EC  81 mg Oral Daily  . atorvastatin  80 mg Oral QHS  . enoxaparin (LOVENOX) injection  40 mg Subcutaneous QHS  . famotidine  20 mg Oral QHS  . feeding supplement (ENSURE ENLIVE)  237 mL Oral Q24H  . [START ON 10/04/2018] furosemide  40 mg Intravenous Daily  . hydrochlorothiazide  25 mg Oral q morning - 10a  . irbesartan  150 mg Oral Daily  . metoprolol tartrate  50 mg Oral BID  . omega-3 acid ethyl esters  1 g  Oral BID  . potassium chloride  40 mEq Oral Q4H  . sucralfate  1 g Oral TID WC & HS   Continuous Infusions:   LOS: 1 day        Corri Delapaz Gerome Apley, MD [facility] Pager [phone]
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
## 2018-10-03 NOTE — Progress Notes (Deleted)
No chief complaint on file.   History of Present Illness: 84 yo female with history of HTN, hyperlipidemia and CAD here today for cardiac follow up. She had a NSTEMI and underwent cardiac cath on 09/07/10 and was found to have three vessel CAD. Drug eluting stents were placed in the RCA, LAD and Diagonal branch. She has done well since then. Syncopal event in December 2016. Event monitor with sinus, no arrhythmias. No recurrence of syncope. Echo September 2017 with normal LV function, no valve disease.   She is here today for follow up. The patient denies any chest pain, dyspnea, palpitations, lower extremity edema, orthopnea, PND, dizziness, near syncope or syncope.   Primary Care Physician: [name], MD  Past Medical History:  Diagnosis Date  . Arthritis   . CAD (coronary artery disease)    s/p cath 09/07/10 w/3 vessel CAD, DES in RCA, Diagonal & LAD  . Dementia ([place])   . HTN (hypertension)   . Hyperlipemia   . MI (myocardial infarction) ([place])    Non-ST elevation 9/11  . Primary osteoarthritis of right knee 09/2016    Past Surgical History:  Procedure Laterality Date  . CARDIAC CATHETERIZATION     YRS AGO   . CATARACT EXTRACTION W/ INTRAOCULAR LENS  IMPLANT, BILATERAL    . PARTIAL HYSTERECTOMY    . RETINAL DETACHMENT SURGERY     LEFT  . TOTAL KNEE ARTHROPLASTY Right 11/13/2016   Procedure: TOTAL KNEE ARTHROPLASTY;  Surgeon: Frederik Pear, MD;  Location: [facility];  Service: Orthopedics;  Laterality: Right;    No current facility-administered medications for this visit.    No current outpatient medications on file.   Facility-Administered Medications Ordered in Other Visits  Medication Dose Route Frequency Provider Last Rate Last Dose  . acetaminophen (TYLENOL) tablet 650 mg  650 mg Oral Q6H PRN [name], MD       Or  . acetaminophen (TYLENOL) suppository 650 mg  650 mg Rectal Q6H PRN [name], MD      . amitriptyline (ELAVIL) tablet 100 mg  100 mg  Oral QHS [name], MD   100 mg at 10/02/18 2251  . aspirin EC tablet 81 mg  81 mg Oral Daily [name], MD   81 mg at 10/03/18 6073  . atorvastatin (LIPITOR) tablet 80 mg  80 mg Oral QHS [name], MD   80 mg at 10/02/18 2251  . enoxaparin (LOVENOX) injection 40 mg  40 mg Subcutaneous QHS [name], MD   40 mg at 10/02/18 2251  . famotidine (PEPCID) tablet 20 mg  20 mg Oral QHS [name], MD   20 mg at 10/02/18 2251  . feeding supplement (ENSURE ENLIVE) (ENSURE ENLIVE) liquid 237 mL  237 mL Oral Q24H Tawni Millers, MD   237 mL at 10/02/18 2021  . [START ON 10/04/2018] furosemide (LASIX) injection 40 mg  40 mg Intravenous Daily Arrien, Jimmy Picket, MD      . hydrALAZINE (APRESOLINE) injection 10 mg  10 mg Intravenous Q4H PRN [name], MD      . hydrochlorothiazide (HYDRODIURIL) tablet 25 mg  25 mg Oral q morning - 10a [name], MD   25 mg at 10/03/18 7106  . irbesartan (AVAPRO) tablet 150 mg  150 mg Oral Daily [name], MD   150 mg at 10/03/18 2694  . metoprolol tartrate (LOPRESSOR) tablet 50 mg  50 mg Oral BID Gean Birchwood  N, MD   50 mg at 10/03/18 0939  . nitroGLYCERIN (NITROSTAT) SL tablet 0.4 mg  0.4 mg Sublingual Q5 min PRN [name], MD      . omega-3 acid ethyl esters (LOVAZA) capsule 1 g  1 g Oral BID [name], MD   1 g at 10/03/18 0939  . ondansetron (ZOFRAN) tablet 4 mg  4 mg Oral Q6H PRN [name], MD       Or  . ondansetron [facility]) injection 4 mg  4 mg Intravenous Q6H PRN [name], MD      . potassium chloride 20 MEQ/15ML (10%) solution 40 mEq  40 mEq Oral Q4H Arrien, Jimmy Picket, MD   40 mEq at 10/03/18 1436  . sucralfate (CARAFATE) tablet 1 g  1 g Oral TID WC & HS Arrien, Jimmy Picket, MD   1 g at 10/03/18 1149    Allergies  Allergen Reactions  . Codeine Other (See Comments)    Makes her severely depressed      Social History   Socioeconomic History  . Marital status: Single    Spouse name: Not on file  . Number of children: Not on file  . Years of education: Not on file  . Highest education level: Not on file  Occupational History  . Not on file  Social Needs  . Financial resource strain: Not on file  . Food insecurity:    Worry: Not on file    Inability: Not on file  . Transportation needs:    Medical: Not on file    Non-medical: Not on file  Tobacco Use  . Smoking status: Never Smoker  . Smokeless tobacco: Never Used  Substance and Sexual Activity  . Alcohol use: No  . Drug use: No  . Sexual activity: Not on file  Lifestyle  . Physical activity:    Days per week: Not on file    Minutes per session: Not on file  . Stress: Not on file  Relationships  . Social connections:    Talks on phone: Not on file    Gets together: Not on file    Attends religious service: Not on file    Active member of club or organization: Not on file    Attends meetings of clubs or organizations: Not on file    Relationship status: Not on file  . Intimate partner violence:    Fear of current or ex partner: Not on file    Emotionally abused: Not on file    Physically abused: Not on file    Forced sexual activity: Not on file  Other Topics Concern  . Not on file  Social History Narrative  . Not on file    Family History  Problem Relation Age of Onset  . Heart attack Mother   . Coronary artery disease Other        diagnosis of CAD but not premature diagnosis  . Coronary artery disease Brother     Review of Systems:  As stated in the HPI and otherwise negative.   There were no vitals taken for this visit.  Physical Examination: General: Well developed, well nourished, NAD  HEENT: OP clear, mucus membranes moist  SKIN: warm, dry. No rashes. Neuro: No focal deficits  Musculoskeletal: Muscle strength 5/5 all ext  Psychiatric: Mood and affect normal  Neck: No JVD, no carotid  bruits, no thyromegaly, no lymphadenopathy.  Lungs:Clear bilaterally, no wheezes, rhonci, crackles Cardiovascular: Regular rate and  rhythm. No murmurs, gallops or rubs. Abdomen:Soft. Bowel sounds present. Non-tender.  Extremities: No lower extremity edema. Pulses are 2 + in the bilateral DP/PT.  Echo 09/08/16: Left ventricle: The cavity size was normal. Wall thickness was   increased in a pattern of moderate LVH. Systolic function was   normal. The estimated ejection fraction was in the range of 50%   to 55%. Wall motion was normal; there were no regional wall   motion abnormalities. Doppler parameters are consistent with   abnormal left ventricular relaxation (grade 1 diastolic   dysfunction). - Mitral valve: Calcified annulus. Mildly thickened leaflets . - Left atrium: The atrium was mildly dilated.  EKG:  EKG is not *** ordered today. The ekg ordered today demonstrates   Recent Labs: 10/01/2018: B Natriuretic Peptide 109.8 10/02/2018: ALT 42; Hemoglobin 10.3; Magnesium 1.5; Platelets 289 10/03/2018: BUN 20; Creatinine, Ser 0.83; Potassium 3.2; Sodium 134   Lipid Panel Followed in primary care   Wt Readings from Last 3 Encounters:  10/02/18 141 lb 12.1 oz (64.3 kg)  05/03/17 141 lb 6.4 oz (64.1 kg)  11/17/16 140 lb (63.5 kg)     Other studies Reviewed: Additional studies/ records that were reviewed today include: . Review of the above records demonstrates:    Assessment and Plan:   1. CAD without angina: No chest pain. Will continue ASA, beta blocker and statin.   2. HLD: Her lipids are followed in primary care. Continue statin.    3. HTN: BP is well controlled.   Current medicines are reviewed at length with the patient today.  The patient does not have concerns regarding medicines.  The following changes have been made:  no change  Labs/ tests ordered today include:   No orders of the defined types were placed in this encounter.   Disposition:   FU with me  in 12  months  Signed, Lauree Chandler, MD 10/03/2018 3:24 PM    [facility] [address], [place], [place]  [address] Phone: [phone]; Fax: [phone]

## 2018-10-04 ENCOUNTER — Inpatient Hospital Stay (HOSPITAL_COMMUNITY): Payer: Medicare HMO

## 2018-10-04 ENCOUNTER — Ambulatory Visit: Payer: Medicare HMO | Admitting: Cardiovascular Disease

## 2018-10-04 DIAGNOSIS — R1310 Dysphagia, unspecified: Secondary | ICD-10-CM

## 2018-10-04 DIAGNOSIS — I5033 Acute on chronic diastolic (congestive) heart failure: Secondary | ICD-10-CM

## 2018-10-04 LAB — BASIC METABOLIC PANEL
Anion gap: 8 (ref 5–15)
BUN: 17 mg/dL (ref 8–23)
CALCIUM: 8.3 mg/dL — AB (ref 8.9–10.3)
CHLORIDE: 101 mmol/L (ref 98–111)
CO2: 26 mmol/L (ref 22–32)
Creatinine, Ser: 0.71 mg/dL (ref 0.44–1.00)
GFR calc Af Amer: >60 mL/min (ref >60)
GLUCOSE: 104 mg/dL — AB (ref 70–99)
POTASSIUM: 4 mmol/L (ref 3.5–5.1)
Sodium: 135 mmol/L (ref 135–145)

## 2018-10-04 MED ORDER — FUROSEMIDE 20 MG PO TABS
20.0000 mg | ORAL_TABLET | Freq: Every day | ORAL | Status: DC
  Administered 2018-10-05 – 2018-10-09 (×5): 20 mg via ORAL
  Filled 2018-10-04 (×5): qty 1
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
## 2018-10-04 NOTE — Progress Notes (Signed)
PROGRESS NOTE    WINDSOR ZIRKELBACH  TML:[id number] DOB: 01-29-1930 DOA: 10/01/2018 PCP: [name], MD    Brief Narrative:  84 year old female who presentedwith lowerextremity edema and dyspnea. She does have the significant past medical history of coronary arterydisease, hypertension,anddyslipidemia. Reported few weeks of generalized weakness, she was diagnosed with anemia as an outpatient, and prescribed iron tablets. Her symptoms kept worsening, nowassociated lower extremity edema and increased abdominal girth. On the initial physical examination blood pressure 133/66, heart rate 80, respiratory rate16, oxygen saturation 97%.Positive JVD, lungs with bilateral rales, heart S1-S2 present and rhythmic, abdomen soft, distended, no guarding, positive lower extremity edema.   Patient was admitted to the hospital with working diagnosis of decompensated heart failure.   Assessment & Plan:   Principal Problem:   Acute CHF (congestive heart failure) (HCC) Active Problems:   HYPERTENSION, BENIGN   CAD, NATIVE VESSEL   Syncope   Macrocytic anemia   CHF (congestive heart failure) ([place])   1. Acute on chronic decompensated heart failure. Improved volume, will change to oral furosemide in am, patient will need to take daily diuretic therapy, will continue heart failure management with metoprolol and irbesartan.   2. CAD. Continue aspirin and atorvastatin.  3. Iron deficiency. Discontinue oral Iron, patient had IV iron yesterday with good toleration.   4. Depression. Continue amitriptyline. Continue to have episodes of confusion. At home tends to sleep during the morning until noon.   5. HTN. On HTCZ and irbesartan, blood pressured is contolled.  6. Hypokalemia. K has been corrected, today up to 4, renal function with serum creatinine at 0,.71 and serum bicarbonate at 26. Will follow on renal panel in am.  7. Odynophagia and dysphagia. Patient with difficulty and pain when  swallowing solids and liquids, will continue antiacid therapy with pantoprazole and sucralfate, order esophagogram and speech therapy. Poor oral intake, patient very weak and deconditioned. Follow with physical therapy recommendations.   DVT prophylaxis:enoxaparin Code Status:full Family Communication:I spoke with patient'sdaughterat the bedside and all questions were addressed.  Disposition Plan/ discharge barriers:Pending clinical improvement, follow with physical therapy evaluation.    Consultants:    Procedures:    Antimicrobials    Subjective: Patient continue to have difficulty swallowing, associated with severe odynophagia, no nausea or vomiting, no chest pain and her dyspnea has improved, continue to be very weak and deconditioned.   Objective: Vitals:   10/03/18 2050 10/04/18 0529 10/04/18 0533 10/04/18 0917  BP: (!) 149/67 (!) 152/67    Pulse: 94 86    Resp: 18 18    Temp: 98.3 F (36.8 C) 98.4 F (36.9 C)    TempSrc: Oral Oral    SpO2: 94% 95%  92%  Weight:   62.1 kg   Height:        Intake/Output Summary (Last 24 hours) at 10/04/2018 1122 Last data filed at 10/04/2018 0500 Gross per 24 hour  Intake 460 ml  Output 925 ml  Net -465 ml   Filed Weights   10/01/18 2328 10/02/18 0420 10/04/18 0533  Weight: 65 kg 64.3 kg 62.1 kg    Examination:   General: deconditioned  Neurology: Awake and alert, non focal  E ENT: mild pallor, no icterus, oral mucosa moist Cardiovascular: No JVD. S1-S2 present, rhythmic, no gallops, rubs, or murmurs. Trace lower extremity edema. Pulmonary: positive breath sounds bilaterally, decreased air movement, with wheezing, rhonchi or rales. Gastrointestinal. Abdomen with no organomegaly, non tender, no rebound or guarding Skin. No rashes Musculoskeletal: no joint deformities  Data Reviewed: I have personally reviewed following labs and imaging studies  CBC: Recent Labs  Lab 10/01/18 1700  10/02/18 0422  WBC 8.5 7.5  HGB 10.2* 10.3*  HCT 32.3* 31.7*  MCV 101.6* 99.1  PLT 319 638   Basic Metabolic Panel: Recent Labs  Lab 10/01/18 1700 10/02/18 0422 10/03/18 0411 10/04/18 0429  NA 132* 132* 134* 135  K 3.7 3.5 3.2* 4.0  CL 100 98 97* 101  CO2 22 24 25 26   GLUCOSE 105* 91 108* 104*  BUN 18 15 20 17   CREATININE 0.71 0.65 0.83 0.71  CALCIUM 8.0* 7.8* 8.0* 8.3*  MG  --  1.5*  --   --    GFR: Estimated Creatinine Clearance: 43.7 mL/min (by C-G formula based on SCr of 0.71 mg/dL). Liver Function Tests: Recent Labs  Lab 10/01/18 1700 10/02/18 0422  AST 71* 67*  ALT 47* 42  ALKPHOS 147* 137*  BILITOT 0.8 0.9  PROT 6.3* 5.9*  ALBUMIN 2.5* 2.3*   Recent Labs  Lab 10/01/18 1700  LIPASE 34   No results for input(s): AMMONIA in the last 168 hours. Coagulation Profile: No results for input(s): INR, PROTIME in the last 168 hours. Cardiac Enzymes: Recent Labs  Lab 10/01/18 2330  TROPONINI <0.03   BNP (last 3 results) No results for input(s): PROBNP in the last 8760 hours. HbA1C: No results for input(s): HGBA1C in the last 72 hours. CBG: No results for input(s): GLUCAP in the last 168 hours. Lipid Profile: No results for input(s): CHOL, HDL, LDLCALC, TRIG, CHOLHDL, LDLDIRECT in the last 72 hours. Thyroid Function Tests: No results for input(s): TSH, T4TOTAL, FREET4, T3FREE, THYROIDAB in the last 72 hours. Anemia Panel: Recent Labs    10/01/18 2330  VITAMINB12 617  FOLATE 5.8*  FERRITIN 254  TIBC 194*  IRON 26*  RETICCTPCT 1.6      Radiology Studies: I have reviewed all of the imaging during this hospital visit personally     Scheduled Meds: . amitriptyline  100 mg Oral QHS  . aspirin EC  81 mg Oral Daily  . atorvastatin  80 mg Oral QHS  . enoxaparin (LOVENOX) injection  40 mg Subcutaneous QHS  . famotidine  20 mg Oral QHS  . feeding supplement (ENSURE ENLIVE)  237 mL Oral Q24H  . furosemide  40 mg Intravenous Daily  .  hydrochlorothiazide  25 mg Oral q morning - 10a  . irbesartan  150 mg Oral Daily  . metoprolol tartrate  50 mg Oral BID  . omega-3 acid ethyl esters  1 g Oral BID  . sucralfate  1 g Oral TID WC & HS   Continuous Infusions:   LOS: 2 days        Mauricio Gerome Apley, MD [facility] Pager [phone]

## 2018-10-05 LAB — BASIC METABOLIC PANEL
Anion gap: 9 (ref 5–15)
BUN: 19 mg/dL (ref 8–23)
CHLORIDE: 98 mmol/L (ref 98–111)
CO2: 26 mmol/L (ref 22–32)
CREATININE: 0.75 mg/dL (ref 0.44–1.00)
Calcium: 8.1 mg/dL — ABNORMAL LOW (ref 8.9–10.3)
Glucose, Bld: 100 mg/dL — ABNORMAL HIGH (ref 70–99)
Potassium: 3.2 mmol/L — ABNORMAL LOW (ref 3.5–5.1)
SODIUM: 133 mmol/L — AB (ref 135–145)

## 2018-10-05 MED ORDER — DILTIAZEM HCL 30 MG PO TABS
30.0000 mg | ORAL_TABLET | Freq: Two times a day (BID) | ORAL | Status: DC
  Administered 2018-10-05 – 2018-10-09 (×8): 30 mg via ORAL
  Filled 2018-10-05 (×9): qty 1

## 2018-10-05 MED ORDER — PANTOPRAZOLE SODIUM 40 MG PO TBEC
40.0000 mg | DELAYED_RELEASE_TABLET | Freq: Two times a day (BID) | ORAL | Status: DC
  Administered 2018-10-05 – 2018-10-09 (×9): 40 mg via ORAL
  Filled 2018-10-05 (×9): qty 1

## 2018-10-05 MED ORDER — DILTIAZEM HCL 30 MG PO TABS: 30.0000 mg | ORAL_TABLET | Freq: Four times a day (QID) | ORAL | Status: DC

## 2018-10-05 MED ORDER — DILTIAZEM HCL 30 MG PO TABS: 30.0000 mg | ORAL_TABLET | Freq: Two times a day (BID) | ORAL | Status: DC

## 2018-10-05 MED ORDER — GNP PEPPERMINT SPIRIT SPRT: Freq: Three times a day (TID) | Status: DC

## 2018-10-05 NOTE — Progress Notes (Signed)
PROGRESS NOTE    Rebecca Ford  ZGY:[id number] DOB: [birth year] DOA: 10/01/2018 PCP: [name], MD    Brief Narrative:  84 year old female who presentedwith lowerextremity edema and dyspnea. She does have the significant past medical history of coronary arterydisease, hypertension,anddyslipidemia. Reported few weeks of generalized weakness, she was diagnosed with anemia as an outpatient, and prescribed iron tablets. Her symptoms kept worsening, nowassociated lower extremity edema and increased abdominal girth. On the initial physical examination blood pressure 133/66, heart rate 80, respiratory rate16, oxygen saturation 97%.Positive JVD, lungs with bilateral rales, heart S1-S2 present and rhythmic, abdomen soft, distended, no guarding, positive lower extremity edema.   Patient was admitted to the hospital with working diagnosis of decompensated heart failure   Assessment & Plan:   Principal Problem:   Acute CHF (congestive heart failure) (HCC) Active Problems:   HYPERTENSION, BENIGN   CAD, NATIVE VESSEL   Syncope   Macrocytic anemia   CHF (congestive heart failure) ([place])   1. Acute on chronic decompensated heart failure. Now patient is normovoemic, will continue diuresis with oral furosemide 20 mg daily. Echocardiogram with normal LV systolic function. Continue medical therapy with irbesartan. Patient very weak and deconditioned, will need snf at discharge.   2. CAD.No angina. Will continue medical therapy with aspirin and atorvastatin.  3. Iron deficiency. sp IV iron with good toleration, plan to follow on iron levels as outpatient.  4. Depression.On amitriptyline. Decreased appetite. No significant confusion.   5. HTN. Continue blood pressure control with HTCZ and irbesartan.  6. Hypokalemia. persistent hypokalemia, will correct with 80 meq Kcl in divided doses, will follow renal panel in am, avoid hypotension or nephrotoxic medications. Serum cr at  0.75.   7. GERD associated with hiatal hernia and esophageal spasms. Will continue antiacid therapy with sucralfate, will add pantoprazole po bid in exchange from famotidine. For esophageal spasms will add diltiazem, 30 mg q12 for now.  DVT prophylaxis:enoxaparin Code Status:full Family Communication:I spoke with patient'sdaughterat the bedside and all questions were addressed.  Disposition Plan/ discharge barriers:Waiting placement at snf.   Consultants:    Procedures:    Antimicrobials   Subjective: Patient has intermittent odynophagia, no nausea or vomiting, very poor appetite, no chest pain or dyspnea. Patient very weak and deconditioned.   Objective: Vitals:   10/04/18 1300 10/04/18 2120 10/05/18 0449 10/05/18 0501  BP: 139/71 137/62 139/65   Pulse: 81 78 91   Resp: 18 16 16    Temp: 98.1 F (36.7 C) 98.1 F (36.7 C) 98.3 F (36.8 C)   TempSrc: Oral Oral Oral   SpO2: 94% 94% 92%   Weight:    60.4 kg  Height:        Intake/Output Summary (Last 24 hours) at 10/05/2018 1252 Last data filed at 10/05/2018 0451 Gross per 24 hour  Intake -  Output 500 ml  Net -500 ml   Filed Weights   10/02/18 0420 10/04/18 0533 10/05/18 0501  Weight: 64.3 kg 62.1 kg 60.4 kg    Examination:   General: deconditioned  Neurology: Awake and alert, non focal  E ENT: mild pallor, no icterus, oral mucosa moist Cardiovascular: No JVD. S1-S2 present, rhythmic, no gallops, rubs, or murmurs. No lower extremity edema. Pulmonary: positive breath sounds bilaterally, adequate air movement, no wheezing, rhonchi or rales. Gastrointestinal. Abdomen mild distended no organomegaly, non tender, no rebound or guarding Skin. No rashes Musculoskeletal: no joint deformities     Data Reviewed: I have personally reviewed following labs and imaging studies  CBC: Recent Labs  Lab 10/01/18 1700 10/02/18 0422  WBC 8.5 7.5  HGB 10.2* 10.3*  HCT 32.3* 31.7*  MCV 101.6*  99.1  PLT 319 741   Basic Metabolic Panel: Recent Labs  Lab 10/01/18 1700 10/02/18 0422 10/03/18 0411 10/04/18 0429 10/05/18 0415  NA 132* 132* 134* 135 133*  K 3.7 3.5 3.2* 4.0 3.2*  CL 100 98 97* 101 98  CO2 22 24 25 26 26   GLUCOSE 105* 91 108* 104* 100*  BUN 18 15 20 17 19   CREATININE 0.71 0.65 0.83 0.71 0.75  CALCIUM 8.0* 7.8* 8.0* 8.3* 8.1*  MG  --  1.5*  --   --   --    GFR: Estimated Creatinine Clearance: 43.2 mL/min (by C-G formula based on SCr of 0.75 mg/dL). Liver Function Tests: Recent Labs  Lab 10/01/18 1700 10/02/18 0422  AST 71* 67*  ALT 47* 42  ALKPHOS 147* 137*  BILITOT 0.8 0.9  PROT 6.3* 5.9*  ALBUMIN 2.5* 2.3*   Recent Labs  Lab 10/01/18 1700  LIPASE 34   No results for input(s): AMMONIA in the last 168 hours. Coagulation Profile: No results for input(s): INR, PROTIME in the last 168 hours. Cardiac Enzymes: Recent Labs  Lab 10/01/18 2330  TROPONINI <0.03   BNP (last 3 results) No results for input(s): PROBNP in the last 8760 hours. HbA1C: No results for input(s): HGBA1C in the last 72 hours. CBG: No results for input(s): GLUCAP in the last 168 hours. Lipid Profile: No results for input(s): CHOL, HDL, LDLCALC, TRIG, CHOLHDL, LDLDIRECT in the last 72 hours. Thyroid Function Tests: No results for input(s): TSH, T4TOTAL, FREET4, T3FREE, THYROIDAB in the last 72 hours. Anemia Panel: No results for input(s): VITAMINB12, FOLATE, FERRITIN, TIBC, IRON, RETICCTPCT in the last 72 hours.    Radiology Studies: I have reviewed all of the imaging during this hospital visit personally     Scheduled Meds: . amitriptyline  100 mg Oral QHS  . aspirin EC  81 mg Oral Daily  . atorvastatin  80 mg Oral QHS  . enoxaparin (LOVENOX) injection  40 mg Subcutaneous QHS  . famotidine  20 mg Oral QHS  . feeding supplement (ENSURE ENLIVE)  237 mL Oral Q24H  . furosemide  20 mg Oral Daily  . hydrochlorothiazide  25 mg Oral q morning - 10a  . irbesartan   150 mg Oral Daily  . metoprolol tartrate  50 mg Oral BID  . omega-3 acid ethyl esters  1 g Oral BID  . sucralfate  1 g Oral TID WC & HS   Continuous Infusions:   LOS: 3 days        Deanna Wiater Gerome Apley, MD [facility] Pager [phone]

## 2018-10-06 DIAGNOSIS — K224 Dyskinesia of esophagus: Secondary | ICD-10-CM

## 2018-10-06 LAB — BASIC METABOLIC PANEL
Anion gap: 10 (ref 5–15)
BUN: 23 mg/dL (ref 8–23)
CALCIUM: 8.1 mg/dL — AB (ref 8.9–10.3)
CO2: 24 mmol/L (ref 22–32)
CREATININE: 0.91 mg/dL (ref 0.44–1.00)
Chloride: 97 mmol/L — ABNORMAL LOW (ref 98–111)
GFR calc non Af Amer: 56 mL/min — ABNORMAL LOW (ref >60)
Glucose, Bld: 117 mg/dL — ABNORMAL HIGH (ref 70–99)
Potassium: 3.7 mmol/L (ref 3.5–5.1)
Sodium: 131 mmol/L — ABNORMAL LOW (ref 135–145)

## 2018-10-06 MED ORDER — POTASSIUM CHLORIDE 20 MEQ PO PACK
20.0000 meq | PACK | Freq: Every day | ORAL | Status: DC
  Administered 2018-10-06 – 2018-10-07 (×2): 20 meq via ORAL
  Filled 2018-10-06 (×2): qty 1

## 2018-10-06 NOTE — Progress Notes (Addendum)
PROGRESS NOTE    Rebecca Ford  YQI:[id number] DOB: 1930-03-28 DOA: 10/01/2018 PCP: [name], MD    Brief Narrative:  84 year old female who presentedwith lowerextremity edema and dyspnea. She does have the significant past medical history of coronary arterydisease, hypertension,anddyslipidemia. Reported few weeks of generalized weakness, she was diagnosed with anemia as an outpatient, and prescribed iron tablets. Her symptoms kept worsening, nowassociated lower extremity edema and increased abdominal girth. On the initial physical examination blood pressure 133/66, heart rate 80, respiratory rate16, oxygen saturation 97%.Positive JVD, lungs with bilateral rales, heart S1-S2 present and rhythmic, abdomen soft, distended, no guarding, positive lower extremity edema.  Sodium 132, potassium 3.7, chloride 100, bicarb 22, glucose 105, BUN 18, creatinine 0.71, BNP 109, cell count 8.5, hemoglobin 10.2, hematocrit 32.3, platelets 319.  Urinalysis negative for infection.  Chest x-ray with vascular congestion and small right pleural effusion.  CT chest negative for pulmonary embolism, bilateral groundglass opacities, right pleural effusion, right thyroid nodule, 3 cm.  EKG was sinus rhythm, left axis deviation, right bundle branch block.  Patient was admitted to the hospital with working diagnosis of decompensated heart failure   Assessment & Plan:   Principal Problem:   Acute CHF (congestive heart failure) (HCC) Active Problems:   HYPERTENSION, BENIGN   CAD, NATIVE VESSEL   Syncope   Macrocytic anemia   CHF (congestive heart failure) ([place])   1. Acute on chronic decompensated heart failure. Continue po furosemide 20 mg daily. Heart failure management with irbesartan.   2. CAD.No angina, on aspirin and atorvastatin.  3. Iron deficiency.sp IV iron. Will need outpatient follow up.   4. Depression.Continue amitriptyline, persistent low appetite.   5. HTN.OnHTCZ and  irbesartan, for blood pressure control.   6. Hypokalemia.Improved K now up to 3,7, will continue k correction wit daily Kcl 20 meq.  7. GERD associated with hiatal hernia and esophageal spasms. On antiacid therapy with sucralfate, and bid pantoprazole. Tolerating welldiltiazem, 30 mg q12 for now. Continue telemetry monitoring. So far patient with no esophageal spasms.   DVT prophylaxis:enoxaparin Code Status:full Family Communication:no family at the bedside   Disposition Plan/ discharge barriers:Waiting placement at snf.   Consultants:    Procedures:    Antimicrobials   Subjective: Patient with no further odynophagia, no nausea or vomiting, continue to have very poor appetite and generalized weakness, no chest pain or dyspnea.   Objective: Vitals:   10/05/18 1300 10/05/18 2103 10/06/18 0438 10/06/18 0853  BP: 135/67 (!) 142/60 108/63 (!) 121/57  Pulse: 88 (!) 108 77 87  Resp: 18 16 18    Temp: 98.7 F (37.1 C) 99.6 F (37.6 C) 97.9 F (36.6 C)   TempSrc: Oral Oral Oral   SpO2: 96% 95% 91%   Weight:   61 kg   Height:        Intake/Output Summary (Last 24 hours) at 10/06/2018 1147 Last data filed at 10/06/2018 0200 Gross per 24 hour  Intake 440 ml  Output 850 ml  Net -410 ml   Filed Weights   10/04/18 0533 10/05/18 0501 10/06/18 0438  Weight: 62.1 kg 60.4 kg 61 kg    Examination:   General: deconditioned  Neurology: Awake and alert, non focal  E ENT: no pallor, no icterus, oral mucosa moist Cardiovascular: No JVD. S1-S2 present, rhythmic, no gallops, rubs, or murmurs. Trace lower extremity edema. Pulmonary: decreased breath sounds bilaterally, poor air movement, no wheezing, rhonchi or rales. Gastrointestinal. Abdomen with no organomegaly, non tender, no rebound or guarding Skin.  No rashes Musculoskeletal: no joint deformities     Data Reviewed: I have personally reviewed following labs and imaging studies  CBC: Recent Labs    Lab 2018/10/08 1700 10/02/18 0422  WBC 8.5 7.5  HGB 10.2* 10.3*  HCT 32.3* 31.7*  MCV 101.6* 99.1  PLT 319 675   Basic Metabolic Panel: Recent Labs  Lab 10/02/18 0422 10/03/18 0411 10/04/18 0429 10/05/18 0415 10/06/18 0421  NA 132* 134* 135 133* 131*  K 3.5 3.2* 4.0 3.2* 3.7  CL 98 97* 101 98 97*  CO2 24 25 26 26 24   GLUCOSE 91 108* 104* 100* 117*  BUN 15 20 17 19 23   CREATININE 0.65 0.83 0.71 0.75 0.91  CALCIUM 7.8* 8.0* 8.3* 8.1* 8.1*  MG 1.5*  --   --   --   --    GFR: Estimated Creatinine Clearance: 38.2 mL/min (by C-G formula based on SCr of 0.91 mg/dL). Liver Function Tests: Recent Labs  Lab 08-Oct-2018 1700 10/02/18 0422  AST 71* 67*  ALT 47* 42  ALKPHOS 147* 137*  BILITOT 0.8 0.9  PROT 6.3* 5.9*  ALBUMIN 2.5* 2.3*   Recent Labs  Lab 10-08-18 1700  LIPASE 34   No results for input(s): AMMONIA in the last 168 hours. Coagulation Profile: No results for input(s): INR, PROTIME in the last 168 hours. Cardiac Enzymes: Recent Labs  Lab 10-08-2018 2330  TROPONINI <0.03   BNP (last 3 results) No results for input(s): PROBNP in the last 8760 hours. HbA1C: No results for input(s): HGBA1C in the last 72 hours. CBG: No results for input(s): GLUCAP in the last 168 hours. Lipid Profile: No results for input(s): CHOL, HDL, LDLCALC, TRIG, CHOLHDL, LDLDIRECT in the last 72 hours. Thyroid Function Tests: No results for input(s): TSH, T4TOTAL, FREET4, T3FREE, THYROIDAB in the last 72 hours. Anemia Panel: No results for input(s): VITAMINB12, FOLATE, FERRITIN, TIBC, IRON, RETICCTPCT in the last 72 hours.    Radiology Studies: I have reviewed all of the imaging during this hospital visit personally     Scheduled Meds: . amitriptyline  100 mg Oral QHS  . aspirin EC  81 mg Oral Daily  . atorvastatin  80 mg Oral QHS  . diltiazem  30 mg Oral Q12H  . enoxaparin (LOVENOX) injection  40 mg Subcutaneous QHS  . feeding supplement (ENSURE ENLIVE)  237 mL Oral Q24H   . furosemide  20 mg Oral Daily  . hydrochlorothiazide  25 mg Oral q morning - 10a  . irbesartan  150 mg Oral Daily  . metoprolol tartrate  50 mg Oral BID  . omega-3 acid ethyl esters  1 g Oral BID  . pantoprazole  40 mg Oral BID  . sucralfate  1 g Oral TID WC & HS   Continuous Infusions:   LOS: 4 days        Mauricio Gerome Apley, MD [facility] Pager [phone]

## 2018-10-07 MED ORDER — POTASSIUM CHLORIDE CRYS ER 20 MEQ PO TBCR
20.0000 meq | EXTENDED_RELEASE_TABLET | Freq: Every day | ORAL | Status: DC
  Administered 2018-10-08 – 2018-10-09 (×2): 20 meq via ORAL
  Filled 2018-10-07 (×2): qty 1

## 2018-10-07 MED ORDER — SUCRALFATE 1 GM/10ML PO SUSP
1.0000 g | Freq: Three times a day (TID) | ORAL | Status: DC
  Administered 2018-10-07 – 2018-10-09 (×7): 1 g via ORAL
  Filled 2018-10-07 (×7): qty 10

## 2018-10-07 NOTE — NC FL2 (Signed)
[place] LEVEL OF CARE SCREENING TOOL     IDENTIFICATION  Patient Name: Rebecca Ford Birthdate: [birth year] Sex: female Admission Date (Current Location): 10/01/2018  [facility] and [place] Number:  Herbalist and Address:  [facility],  [place], [place]      Provider Number: [id number]  Attending Physician Name and Address:  Tawni Millers  Relative Name and Phone Number:  daughter Abigail Butts [phone]    Current Level of Care: Hospital Recommended Level of Care: [place] Prior Approval Number:    Date Approved/Denied:   PASRR Number: [id number]   Discharge Plan: SNF    Current Diagnoses: Patient Active Problem List   Diagnosis Date Noted  . CHF (congestive heart failure) ([place]) 10/02/2018  . Acute CHF (congestive heart failure) ([place]) 10/01/2018  . Macrocytic anemia 10/01/2018  . Chronic depression 12/03/2016  . Localized osteoarthritis of right knee 11/13/2016  . Primary osteoarthritis of right knee 12020/01/2416  . Syncope 11/16/2015  . 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 HYPERTENSION, BENIGN 09/20/2010  . CAD, NATIVE VESSEL 09/20/2010    Orientation RESPIRATION BLADDER Height & Weight     Self, Time, Situation, Place  Normal Incontinent, External catheter Weight: 134 lb 1.6 oz (60.8 kg) Height:  5\' 2"  (157.5 cm)  BEHAVIORAL SYMPTOMS/MOOD NEUROLOGICAL BOWEL NUTRITION STATUS      Continent Diet(low sodium heart healthy diet)  AMBULATORY STATUS COMMUNICATION OF NEEDS Skin   Limited Assist Verbally Normal                       Personal Care Assistance Level of Assistance  Bathing, Feeding, Dressing Bathing Assistance: Limited assistance Feeding assistance: Independent Dressing Assistance: Limited assistance     Functional Limitations Info  Sight, Hearing, Speech Sight Info: Adequate Hearing Info: Adequate Speech Info: Adequate    SPECIAL CARE FACTORS FREQUENCY   PT (By licensed PT), OT (By licensed OT)     PT Frequency: 5x OT Frequency: 5x            Contractures Contractures Info: Not present    Additional Factors Info  Code Status, Allergies Code Status Info: full code Allergies Info: codeine           Current Medications (10/07/2018):  This is the current hospital active medication list Current Facility-Administered Medications  Medication Dose Route Frequency Provider Last Rate Last Dose  . acetaminophen (TYLENOL) tablet 650 mg  650 mg Oral Q6H PRN [name], MD       Or  . acetaminophen (TYLENOL) suppository 650 mg  650 mg Rectal Q6H PRN [name], MD      . amitriptyline (ELAVIL) tablet 100 mg  100 mg Oral QHS [name], MD   100 mg at 10/06/18 2337  . aspirin EC tablet 81 mg  81 mg Oral Daily [name], MD   81 mg at 10/07/18 1005  . atorvastatin (LIPITOR) tablet 80 mg  80 mg Oral QHS [name], MD   80 mg at 10/06/18 2338  . diltiazem (CARDIZEM) tablet 30 mg  30 mg Oral Q12H Arrien, Jimmy Picket, MD   30 mg at 10/07/18 1005  . enoxaparin (LOVENOX) injection 40 mg  40 mg Subcutaneous QHS [name], MD   40 mg at 10/06/18 2338  . feeding supplement (ENSURE ENLIVE) (ENSURE ENLIVE) liquid 237 mL  237 mL Oral Q24H Arrien, Jimmy Picket, MD   [phone]  mL at 10/06/18 2000  . furosemide (LASIX) tablet 20 mg  20 mg Oral Daily Arrien, Jimmy Picket, MD   20 mg at 10/07/18 1005  . hydrochlorothiazide (HYDRODIURIL) tablet 25 mg  25 mg Oral q morning - 10a [name], MD   25 mg at 10/07/18 1006  . irbesartan (AVAPRO) tablet 150 mg  150 mg Oral Daily [name], MD   150 mg at 10/07/18 1006  . metoprolol tartrate (LOPRESSOR) tablet 50 mg  50 mg Oral BID [name], MD   50 mg at 10/07/18 1006  . nitroGLYCERIN (NITROSTAT) SL tablet 0.4 mg  0.4 mg Sublingual Q5 min PRN [name], MD      . omega-3 acid ethyl esters (LOVAZA) capsule 1  g  1 g Oral BID [name], MD   1 g at 10/07/18 1006  . ondansetron (ZOFRAN) tablet 4 mg  4 mg Oral Q6H PRN [name], MD       Or  . ondansetron [facility]) injection 4 mg  4 mg Intravenous Q6H PRN [name], MD      . pantoprazole (PROTONIX) EC tablet 40 mg  40 mg Oral BID Tawni Millers, MD   40 mg at 10/07/18 1007  . [START ON 10/08/2018] potassium chloride SA (K-DUR,KLOR-CON) CR tablet 20 mEq  20 mEq Oral Daily Arrien, Jimmy Picket, MD      . sucralfate (CARAFATE) tablet 1 g  1 g Oral TID WC & HS Arrien, Jimmy Picket, MD   1 g at 10/07/18 1346     Discharge Medications: Please see discharge summary for a list of discharge medications.  Relevant Imaging Results:  Relevant Lab Results:   Additional Information SSN: [id number]  Nila Nephew, LCSW
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
## 2018-10-07 NOTE — Care Management Note (Signed)
Case Management Note  Patient Details  Name: Rebecca Ford MRN: [id number] Date of Birth: 24-Mar-1930  Subjective/Objective:                  Discharge planning-spoke wit patient and daughter have been waiting for csw to speak to them about rehab placement/ no orders in system for this/spoke with Dr. Nat Math and order placment in system,ptn is essentially ready to go/state order entered.  Action/Plan: See above  Expected Discharge Date:  (unknown)               Expected Discharge Plan:     In-House Referral:     Discharge planning Services     Post Acute Care Choice:    Choice offered to:     DME Arranged:    DME Agency:     HH Arranged:    HH Agency:     Status of Service:     If discussed at [employer] of Stay Meetings, dates discussed:    Additional Comments:  Leeroy Cha, RN 10/07/2018, 3:34 PM

## 2018-10-07 NOTE — Progress Notes (Signed)
PROGRESS NOTE    KALIYAH GLADMAN  OEH:[id number] DOB: 1930-07-30 DOA: 10/01/2018 PCP: [name], MD    Brief Narrative:  84 year old female who presentedwith lowerextremity edema and dyspnea. She does have the significant past medical history of coronary arterydisease, hypertension,anddyslipidemia. Reported few weeks of generalized weakness, she was diagnosed with anemia as an outpatient, and prescribed iron tablets. Her symptoms kept worsening, nowassociated lower extremity edema and increased abdominal girth. On the initial physical examination blood pressure 133/66, heart rate 80, respiratory rate16, oxygen saturation 97%.Positive JVD, lungs with bilateral rales, heart S1-S2 present and rhythmic, abdomen soft, distended, no guarding, positive lower extremity edema.  Sodium 132, potassium 3.7, chloride 100, bicarb 22, glucose 105, BUN 18, creatinine 0.71, BNP 109, cell count 8.5, hemoglobin 10.2, hematocrit 32.3, platelets 319.  Urinalysis negative for infection.  Chest x-ray with vascular congestion and small right pleural effusion.  CT chest negative for pulmonary embolism, bilateral groundglass opacities, right pleural effusion, right thyroid nodule, 3 cm.  EKG was sinus rhythm, left axis deviation, right bundle branch block.  Patient was admitted to the hospital with working diagnosis of decompensated heart failure   Assessment & Plan:   Principal Problem:   Acute CHF (congestive heart failure) (HCC) Active Problems:   HYPERTENSION, BENIGN   CAD, NATIVE VESSEL   Syncope   Macrocytic anemia   CHF (congestive heart failure) ([place])   1. Acute on chronic decompensated heart failure.Clinically euvolemic will continue po furosemide 20 mg daily, to keep fluid balance negative, continue heart failure management with irbesartan.  2. CAD.Continue with aspirin and atorvastatin.  3. Iron deficiency.sp IV iron. Follow up as outpatient.   4. Depression.On amitriptyline,  continue to report very low appetite.   5. HTN. Continue with HTCZ and irbesartan, for blood pressure control.   6. Hypokalemia. Corrected with po Kcl. Will need close follow up of renal function, will use kcl 20 meq daily to use with furosemide.   7.GERD associated with hiatal hernia and esophageal spasms. No further esophageal spasms, patient tolerating well diltiazem, no bradycardia noted, will discontinue telemetry monitoring.  DVT prophylaxis:enoxaparin Code Status:full Family Communication:no family at the bedside   Disposition Plan/ discharge barriers:Waiting placement at snf.  Consultants:    Procedures:     Subjective: No further odynophagia, continue to have poor appetite, no nausea or vomiting. This am with lower back pain, worse with movement. Very weak and deconditioned.   Objective: Vitals:   10/07/18 0456 10/07/18 0500 10/07/18 1005 10/07/18 1006  BP: 105/79  133/60   Pulse: 84   94  Resp:      Temp: 98.2 F (36.8 C)     TempSrc:      SpO2: 92%     Weight:  60.8 kg    Height:        Intake/Output Summary (Last 24 hours) at 10/07/2018 1226 Last data filed at 10/07/2018 0600 Gross per 24 hour  Intake 600 ml  Output 1100 ml  Net -500 ml   Filed Weights   10/05/18 0501 10/06/18 0438 10/07/18 0500  Weight: 60.4 kg 61 kg 60.8 kg    Examination:   General: deconditioned  Neurology: Awake and alert, non focal  E ENT: mild pallor, no icterus, oral mucosa moist Cardiovascular: No JVD. S1-S2 present, rhythmic, no gallops, rubs, or murmurs. No lower extremity edema. Pulmonary: vesicular breath sounds bilaterally, adequate air movement, no wheezing, rhonchi or rales. Gastrointestinal. Abdomen with no organomegaly, non tender, no rebound or guarding Skin. No rashes  Musculoskeletal: no joint deformities     Data Reviewed: I have personally reviewed following labs and imaging studies  CBC: Recent Labs  Lab 10/01/18 1700  10/02/18 0422  WBC 8.5 7.5  HGB 10.2* 10.3*  HCT 32.3* 31.7*  MCV 101.6* 99.1  PLT 319 468   Basic Metabolic Panel: Recent Labs  Lab 10/02/18 0422 10/03/18 0411 10/04/18 0429 10/05/18 0415 10/06/18 0421  NA 132* 134* 135 133* 131*  K 3.5 3.2* 4.0 3.2* 3.7  CL 98 97* 101 98 97*  CO2 24 25 26 26 24   GLUCOSE 91 108* 104* 100* 117*  BUN 15 20 17 19 23   CREATININE 0.65 0.83 0.71 0.75 0.91  CALCIUM 7.8* 8.0* 8.3* 8.1* 8.1*  MG 1.5*  --   --   --   --    GFR: Estimated Creatinine Clearance: 38.1 mL/min (by C-G formula based on SCr of 0.91 mg/dL). Liver Function Tests: Recent Labs  Lab 10/01/18 1700 10/02/18 0422  AST 71* 67*  ALT 47* 42  ALKPHOS 147* 137*  BILITOT 0.8 0.9  PROT 6.3* 5.9*  ALBUMIN 2.5* 2.3*   Recent Labs  Lab 10/01/18 1700  LIPASE 34   No results for input(s): AMMONIA in the last 168 hours. Coagulation Profile: No results for input(s): INR, PROTIME in the last 168 hours. Cardiac Enzymes: Recent Labs  Lab 10/01/18 2330  TROPONINI <0.03   BNP (last 3 results) No results for input(s): PROBNP in the last 8760 hours. HbA1C: No results for input(s): HGBA1C in the last 72 hours. CBG: No results for input(s): GLUCAP in the last 168 hours. Lipid Profile: No results for input(s): CHOL, HDL, LDLCALC, TRIG, CHOLHDL, LDLDIRECT in the last 72 hours. Thyroid Function Tests: No results for input(s): TSH, T4TOTAL, FREET4, T3FREE, THYROIDAB in the last 72 hours. Anemia Panel: No results for input(s): VITAMINB12, FOLATE, FERRITIN, TIBC, IRON, RETICCTPCT in the last 72 hours.    Radiology Studies: I have reviewed all of the imaging during this hospital visit personally     Scheduled Meds: . amitriptyline  100 mg Oral QHS  . aspirin EC  81 mg Oral Daily  . atorvastatin  80 mg Oral QHS  . diltiazem  30 mg Oral Q12H  . enoxaparin (LOVENOX) injection  40 mg Subcutaneous QHS  . feeding supplement (ENSURE ENLIVE)  237 mL Oral Q24H  . furosemide  20 mg  Oral Daily  . hydrochlorothiazide  25 mg Oral q morning - 10a  . irbesartan  150 mg Oral Daily  . metoprolol tartrate  50 mg Oral BID  . omega-3 acid ethyl esters  1 g Oral BID  . pantoprazole  40 mg Oral BID  . [START ON 10/08/2018] potassium chloride  20 mEq Oral Daily  . sucralfate  1 g Oral TID WC & HS   Continuous Infusions:   LOS: 5 days        Mauricio Gerome Apley, MD [facility] Pager [phone]

## 2018-10-07 NOTE — Care Management Important Message (Signed)
Important Message  Patient Details  Name: FRANKEE GRITZ MRN: [id number] Date of Birth: Oct 05, 1930   Medicare Important Message Given:  Yes    Kerin Salen 10/07/2018, 11:34 AMImportant Message  Patient Details  Name: IZA PRESTON MRN: [id number] Date of Birth: [birth year]   Medicare Important Message Given:  Yes    Kerin Salen 10/07/2018, 11:34 AM

## 2018-10-08 LAB — CREATININE, SERUM: CREATININE: 0.85 mg/dL (ref 0.44–1.00)

## 2018-10-08 MED ORDER — PRO-STAT SUGAR FREE PO LIQD
30.0000 mL | Freq: Two times a day (BID) | ORAL | Status: DC
  Administered 2018-10-08 – 2018-10-09 (×3): 30 mL via ORAL
  Filled 2018-10-08 (×3): qty 30

## 2018-10-08 MED ORDER — MEGESTROL ACETATE 400 MG/10ML PO SUSP
400.0000 mg | Freq: Every day | ORAL | Status: DC
  Administered 2018-10-08 – 2018-10-09 (×2): 400 mg via ORAL
  Filled 2018-10-08 (×2): qty 10

## 2018-10-08 MED ORDER — MEGESTROL ACETATE 40 MG PO TABS: 40.0000 mg | ORAL_TABLET | Freq: Every day | ORAL | Status: DC

## 2018-10-08 NOTE — Progress Notes (Addendum)
Nutrition Follow-up  DOCUMENTATION CODES:   (Will assess for malnutrition at follow-up. )  INTERVENTION:    30 ml Prostat BID, each supplement provides 100 kcals and 15 grams protein.   Magic cup BID with meals, each supplement provides 290 kcal and 9 grams of protein  Liberalize diet to regular with 1200 ml fluid restriction  NUTRITION DIAGNOSIS:   Inadequate oral intake related to acute illness as evidenced by meal completion < 50%.  Ongoing  GOAL:   Patient will meet greater than or equal to 90% of their needs  Not meeting  MONITOR:   PO intake, Supplement acceptance, Weight trends, Labs  REASON FOR ASSESSMENT:   Malnutrition Screening Tool    ASSESSMENT:   84 y.o. female with history of CAD s/p stenting in 2011, HTN, syncope, and hyperlipidemia. She has been feeling weak and fatigued for past few weeks and had gone to her PCP and was found to be anemic and was prescribed iron pills. Patient was instructed to stay well-hydrated. Over the last 1 week patient has been having increasing lower extremity edema, increased abdominal girth, and easily gets SOB with minimal exertion to the point that patient is not even able to tie her shoes.   10/22- heart healthy diet  Meal completions charted as 25-75% for her last eight meals. Pt reports her appetite remains poor due to limited food options and taste changes. This has been going on for 3-4 weeks PTA. [name] to start megace today in attempts to stimulate appetite. Daughter requesting for diet to be liberalized to give pt more options stating "she is 84 years old, let her eat what she wants." Will reach out to MD. Pt does not like Ensure. Will attempt Prostat. Pt continue to have swelling making it hard to perform NFPE. Will re-attempt at follow up.   Weight noted decrease from 141 on 10/23 to 135 lb today. Pt continues to diurese with lasix.   Medications reviewed and include: 20 mg lasix once daily, 20 mEq KCl once  daily Labs reviewed: Na 131 (L)   Diet Order:   Diet Order            Diet Heart Room service appropriate? Yes; Fluid consistency: Thin; Fluid restriction: 1200 mL Fluid  Diet effective now              EDUCATION NEEDS:   Not appropriate for education at this time  Skin:  Skin Assessment: Reviewed RN Assessment  Last BM:  10/07/18  Height:   Ht Readings from Last 1 Encounters:  10/01/18 5\' 2"  (1.575 m)    Weight:   Wt Readings from Last 1 Encounters:  10/08/18 61.4 kg    Ideal Body Weight:  50 kg  BMI:  Body mass index is 24.76 kg/m.  Estimated Nutritional Needs:   Kcal:  1475-1610  Protein:  55-65 grams  Fluid:  >/= 1.5 L/day   Mariana Single RD, LDN Clinical Nutrition Pager # - [phone]
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
## 2018-10-08 NOTE — Progress Notes (Signed)
PROGRESS NOTE    Rebecca Ford  WUJ:[id number] DOB: July 17, 1930 DOA: 10/01/2018 PCP: [name], MD    Brief Narrative:  84 year old female who presentedwith lowerextremity edema and dyspnea. She does have the significant past medical history of coronary arterydisease, hypertension,anddyslipidemia. Reported few weeks of generalized weakness, she was diagnosed with anemia as an outpatient, and prescribed iron tablets. Her symptoms kept worsening, nowassociated lower extremity edema and increased abdominal girth. On the initial physical examination blood pressure 133/66, heart rate 80, respiratory rate16, oxygen saturation 97%.Positive JVD, lungs with bilateral rales, heart S1-S2 present and rhythmic, abdomen soft, distended, no guarding, positive lower extremity edema. Sodium 132, potassium 3.7, chloride 100, bicarb 22, glucose 105, BUN 18, creatinine 0.71,BNP 109,cell count 8.5, hemoglobin 10.2, hematocrit 32.3, platelets 319.Urinalysis negative for infection. Chest x-ray with vascular congestion and smallright pleural effusion. CT chest negative for pulmonary embolism, bilateral groundglass opacities, right pleural effusion, right thyroid nodule, 3 cm.EKG was sinus rhythm, left axis deviation, right bundle branch block.  Patient was admitted to the hospital with working diagnosis of decompensated heart failure   Assessment & Plan:   Principal Problem:   Acute CHF (congestive heart failure) (HCC) Active Problems:   HYPERTENSION, BENIGN   CAD, NATIVE VESSEL   Syncope   Macrocytic anemia   CHF (congestive heart failure) ([place])   1. Acute on chronic decompensated heart failure.patient conntinue to be euvolemic.On pofurosemide 20 mg daily. Heart failure management withirbesartan.  2. CAD.On aspirin and atorvastatin.  3. Iron deficiency.sp IV iron. Follow up as outpatient. No further hb check in the hospital.   4. Depression.Continue amitriptyline, will  add megace as a trial.    5. HTN. On HTCZ and irbesartan, for blood pressure control.  6. Hypokalemia. Continue k correction with 20 meq daily, will need outpatient renal panel monitoring.  7.GERD associated with hiatal hernia and esophageal spasms.Tolerating well diltiazem, continue antiacid therapy.  DVT prophylaxis:enoxaparin Code Status:full Family Communication:I spoke with patient's daughter at the bedside and all questions were addressed.   Disposition Plan/ discharge barriers:Waiting placement at snf.  Consultants:    Procedures:     Body mass index is 24.76 kg/m. Malnutrition Type:  Nutrition Problem: Inadequate oral intake Etiology: acute illness   Malnutrition Characteristics:  Signs/Symptoms: meal completion < 50%   Nutrition Interventions:  Interventions: Ensure Enlive (each supplement provides 350kcal and 20 grams of protein)  RN Pressure Injury Documentation:     Consultants:     Procedures:     Antimicrobials:      Subjective: Patient has been experiencing left side lower back pain, worse with movement, moderate in intensity, sharp in nature, no radiation, no associated symptoms.   Objective: Vitals:   10/08/18 0756 10/08/18 1100 10/08/18 1200 10/08/18 1425  BP: 126/68  (!) 100/44 118/68  Pulse: 75  71 77  Resp: (!) 21  20 20   Temp: (!) 97.4 F (36.3 C)  97.6 F (36.4 C) 97.7 F (36.5 C)  TempSrc: Oral  Oral Oral  SpO2: 95%  99% 98%  Weight:  61.4 kg    Height:        Intake/Output Summary (Last 24 hours) at 10/08/2018 1513 Last data filed at 10/08/2018 1409 Gross per 24 hour  Intake 582 ml  Output 350 ml  Net 232 ml   Filed Weights   10/07/18 0500 10/08/18 0459 10/08/18 1100  Weight: 60.8 kg 61.5 kg 61.4 kg    Examination:   General: deconditioned  Neurology: Awake and alert, non focal  E ENT: mild pallor, no icterus, oral mucosa moist Cardiovascular: No JVD. S1-S2 present, rhythmic,  no gallops, rubs, or murmurs. No lower extremity edema. Pulmonary: positive breath sounds bilaterally, adequate air movement, no wheezing, rhonchi or rales. Gastrointestinal. Abdomen with no organomegaly, non tender, no rebound or guarding Skin. No rashes Musculoskeletal: no joint deformities     Data Reviewed: I have personally reviewed following labs and imaging studies  CBC: Recent Labs  Lab 10/01/18 1700 10/02/18 0422  WBC 8.5 7.5  HGB 10.2* 10.3*  HCT 32.3* 31.7*  MCV 101.6* 99.1  PLT 319 448   Basic Metabolic Panel: Recent Labs  Lab 10/02/18 0422 10/03/18 0411 10/04/18 0429 10/05/18 0415 10/06/18 0421 10/08/18 0431  NA 132* 134* 135 133* 131*  --   K 3.5 3.2* 4.0 3.2* 3.7  --   CL 98 97* 101 98 97*  --   CO2 24 25 26 26 24   --   GLUCOSE 91 108* 104* 100* 117*  --   BUN 15 20 17 19 23   --   CREATININE 0.65 0.83 0.71 0.75 0.91 0.85  CALCIUM 7.8* 8.0* 8.3* 8.1* 8.1*  --   MG 1.5*  --   --   --   --   --    GFR: Estimated Creatinine Clearance: 41 mL/min (by C-G formula based on SCr of 0.85 mg/dL). Liver Function Tests: Recent Labs  Lab 10/01/18 1700 10/02/18 0422  AST 71* 67*  ALT 47* 42  ALKPHOS 147* 137*  BILITOT 0.8 0.9  PROT 6.3* 5.9*  ALBUMIN 2.5* 2.3*   Recent Labs  Lab 10/01/18 1700  LIPASE 34   No results for input(s): AMMONIA in the last 168 hours. Coagulation Profile: No results for input(s): INR, PROTIME in the last 168 hours. Cardiac Enzymes: Recent Labs  Lab 10/01/18 2330  TROPONINI <0.03   BNP (last 3 results) No results for input(s): PROBNP in the last 8760 hours. HbA1C: No results for input(s): HGBA1C in the last 72 hours. CBG: No results for input(s): GLUCAP in the last 168 hours. Lipid Profile: No results for input(s): CHOL, HDL, LDLCALC, TRIG, CHOLHDL, LDLDIRECT in the last 72 hours. Thyroid Function Tests: No results for input(s): TSH, T4TOTAL, FREET4, T3FREE, THYROIDAB in the last 72 hours. Anemia Panel: No results  for input(s): VITAMINB12, FOLATE, FERRITIN, TIBC, IRON, RETICCTPCT in the last 72 hours.    Radiology Studies: I have reviewed all of the imaging during this hospital visit personally     Scheduled Meds: . amitriptyline  100 mg Oral QHS  . aspirin EC  81 mg Oral Daily  . atorvastatin  80 mg Oral QHS  . diltiazem  30 mg Oral Q12H  . enoxaparin (LOVENOX) injection  40 mg Subcutaneous QHS  . feeding supplement (PRO-STAT SUGAR FREE 64)  30 mL Oral BID  . furosemide  20 mg Oral Daily  . hydrochlorothiazide  25 mg Oral q morning - 10a  . irbesartan  150 mg Oral Daily  . metoprolol tartrate  50 mg Oral BID  . omega-3 acid ethyl esters  1 g Oral BID  . pantoprazole  40 mg Oral BID  . potassium chloride  20 mEq Oral Daily  . sucralfate  1 g Oral TID WC & HS   Continuous Infusions:   LOS: 6 days        Yonael Tulloch Gerome Apley, MD [facility] Pager [phone]

## 2018-10-09 DIAGNOSIS — R4689 Other symptoms and signs involving appearance and behavior: Secondary | ICD-10-CM | POA: Diagnosis not present

## 2018-10-09 DIAGNOSIS — E860 Dehydration: Secondary | ICD-10-CM | POA: Diagnosis not present

## 2018-10-09 DIAGNOSIS — K224 Dyskinesia of esophagus: Secondary | ICD-10-CM | POA: Diagnosis not present

## 2018-10-09 DIAGNOSIS — K449 Diaphragmatic hernia without obstruction or gangrene: Secondary | ICD-10-CM | POA: Diagnosis not present

## 2018-10-09 DIAGNOSIS — I252 Old myocardial infarction: Secondary | ICD-10-CM | POA: Diagnosis not present

## 2018-10-09 DIAGNOSIS — R4182 Altered mental status, unspecified: Secondary | ICD-10-CM | POA: Diagnosis not present

## 2018-10-09 DIAGNOSIS — L299 Pruritus, unspecified: Secondary | ICD-10-CM | POA: Diagnosis not present

## 2018-10-09 DIAGNOSIS — R443 Hallucinations, unspecified: Secondary | ICD-10-CM | POA: Diagnosis not present

## 2018-10-09 DIAGNOSIS — R627 Adult failure to thrive: Secondary | ICD-10-CM | POA: Diagnosis present

## 2018-10-09 DIAGNOSIS — I5033 Acute on chronic diastolic (congestive) heart failure: Secondary | ICD-10-CM | POA: Diagnosis not present

## 2018-10-09 DIAGNOSIS — R0902 Hypoxemia: Secondary | ICD-10-CM | POA: Diagnosis not present

## 2018-10-09 DIAGNOSIS — I444 Left anterior fascicular block: Secondary | ICD-10-CM | POA: Diagnosis not present

## 2018-10-09 DIAGNOSIS — R41841 Cognitive communication deficit: Secondary | ICD-10-CM | POA: Diagnosis not present

## 2018-10-09 DIAGNOSIS — R410 Disorientation, unspecified: Secondary | ICD-10-CM | POA: Diagnosis not present

## 2018-10-09 DIAGNOSIS — R531 Weakness: Secondary | ICD-10-CM | POA: Diagnosis not present

## 2018-10-09 DIAGNOSIS — K219 Gastro-esophageal reflux disease without esophagitis: Secondary | ICD-10-CM

## 2018-10-09 DIAGNOSIS — I5032 Chronic diastolic (congestive) heart failure: Secondary | ICD-10-CM | POA: Diagnosis not present

## 2018-10-09 DIAGNOSIS — M6281 Muscle weakness (generalized): Secondary | ICD-10-CM | POA: Diagnosis not present

## 2018-10-09 DIAGNOSIS — R55 Syncope and collapse: Secondary | ICD-10-CM | POA: Diagnosis not present

## 2018-10-09 DIAGNOSIS — I509 Heart failure, unspecified: Secondary | ICD-10-CM | POA: Diagnosis not present

## 2018-10-09 DIAGNOSIS — E785 Hyperlipidemia, unspecified: Secondary | ICD-10-CM | POA: Diagnosis present

## 2018-10-09 DIAGNOSIS — M2550 Pain in unspecified joint: Secondary | ICD-10-CM | POA: Diagnosis not present

## 2018-10-09 DIAGNOSIS — R1312 Dysphagia, oropharyngeal phase: Secondary | ICD-10-CM | POA: Diagnosis not present

## 2018-10-09 DIAGNOSIS — T385X5A Adverse effect of other estrogens and progestogens, initial encounter: Secondary | ICD-10-CM | POA: Diagnosis not present

## 2018-10-09 DIAGNOSIS — F05 Delirium due to known physiological condition: Secondary | ICD-10-CM | POA: Diagnosis not present

## 2018-10-09 DIAGNOSIS — E43 Unspecified severe protein-calorie malnutrition: Secondary | ICD-10-CM | POA: Diagnosis not present

## 2018-10-09 DIAGNOSIS — Z961 Presence of intraocular lens: Secondary | ICD-10-CM | POA: Diagnosis present

## 2018-10-09 DIAGNOSIS — D72829 Elevated white blood cell count, unspecified: Secondary | ICD-10-CM | POA: Diagnosis not present

## 2018-10-09 DIAGNOSIS — G9341 Metabolic encephalopathy: Secondary | ICD-10-CM | POA: Diagnosis not present

## 2018-10-09 DIAGNOSIS — R4189 Other symptoms and signs involving cognitive functions and awareness: Secondary | ICD-10-CM | POA: Diagnosis not present

## 2018-10-09 DIAGNOSIS — Z7982 Long term (current) use of aspirin: Secondary | ICD-10-CM | POA: Diagnosis not present

## 2018-10-09 DIAGNOSIS — Z8249 Family history of ischemic heart disease and other diseases of the circulatory system: Secondary | ICD-10-CM | POA: Diagnosis not present

## 2018-10-09 DIAGNOSIS — N179 Acute kidney failure, unspecified: Secondary | ICD-10-CM | POA: Diagnosis not present

## 2018-10-09 DIAGNOSIS — J90 Pleural effusion, not elsewhere classified: Secondary | ICD-10-CM | POA: Diagnosis not present

## 2018-10-09 DIAGNOSIS — R21 Rash and other nonspecific skin eruption: Secondary | ICD-10-CM | POA: Diagnosis not present

## 2018-10-09 DIAGNOSIS — I5030 Unspecified diastolic (congestive) heart failure: Secondary | ICD-10-CM | POA: Diagnosis not present

## 2018-10-09 DIAGNOSIS — I110 Hypertensive heart disease with heart failure: Secondary | ICD-10-CM | POA: Diagnosis not present

## 2018-10-09 DIAGNOSIS — R000 Tachycardia, unspecified: Secondary | ICD-10-CM | POA: Diagnosis not present

## 2018-10-09 DIAGNOSIS — D649 Anemia, unspecified: Secondary | ICD-10-CM | POA: Diagnosis not present

## 2018-10-09 DIAGNOSIS — Z96651 Presence of right artificial knee joint: Secondary | ICD-10-CM | POA: Diagnosis present

## 2018-10-09 DIAGNOSIS — F0390 Unspecified dementia without behavioral disturbance: Secondary | ICD-10-CM | POA: Diagnosis not present

## 2018-10-09 DIAGNOSIS — Z7401 Bed confinement status: Secondary | ICD-10-CM | POA: Diagnosis not present

## 2018-10-09 DIAGNOSIS — I2510 Atherosclerotic heart disease of native coronary artery without angina pectoris: Secondary | ICD-10-CM | POA: Diagnosis not present

## 2018-10-09 DIAGNOSIS — X58XXXA Exposure to other specified factors, initial encounter: Secondary | ICD-10-CM | POA: Diagnosis present

## 2018-10-09 DIAGNOSIS — Z885 Allergy status to narcotic agent status: Secondary | ICD-10-CM | POA: Diagnosis not present

## 2018-10-09 DIAGNOSIS — L239 Allergic contact dermatitis, unspecified cause: Secondary | ICD-10-CM | POA: Diagnosis not present

## 2018-10-09 DIAGNOSIS — Z6824 Body mass index (BMI) 24.0-24.9, adult: Secondary | ICD-10-CM | POA: Diagnosis not present

## 2018-10-09 DIAGNOSIS — N19 Unspecified kidney failure: Secondary | ICD-10-CM | POA: Diagnosis not present

## 2018-10-09 DIAGNOSIS — N390 Urinary tract infection, site not specified: Secondary | ICD-10-CM | POA: Diagnosis not present

## 2018-10-09 DIAGNOSIS — Z7983 Long term (current) use of bisphosphonates: Secondary | ICD-10-CM | POA: Diagnosis not present

## 2018-10-09 DIAGNOSIS — Z9841 Cataract extraction status, right eye: Secondary | ICD-10-CM | POA: Diagnosis not present

## 2018-10-09 DIAGNOSIS — Z9842 Cataract extraction status, left eye: Secondary | ICD-10-CM | POA: Diagnosis not present

## 2018-10-09 DIAGNOSIS — D509 Iron deficiency anemia, unspecified: Secondary | ICD-10-CM | POA: Diagnosis present

## 2018-10-09 DIAGNOSIS — M1711 Unilateral primary osteoarthritis, right knee: Secondary | ICD-10-CM | POA: Diagnosis present

## 2018-10-09 DIAGNOSIS — R278 Other lack of coordination: Secondary | ICD-10-CM | POA: Diagnosis not present

## 2018-10-09 DIAGNOSIS — I5031 Acute diastolic (congestive) heart failure: Secondary | ICD-10-CM | POA: Diagnosis not present

## 2018-10-09 DIAGNOSIS — I10 Essential (primary) hypertension: Secondary | ICD-10-CM | POA: Diagnosis not present

## 2018-10-09 DIAGNOSIS — R1310 Dysphagia, unspecified: Secondary | ICD-10-CM | POA: Diagnosis not present

## 2018-10-09 MED ORDER — MEGESTROL ACETATE 400 MG/10ML PO SUSP: 400.0000 mg | Freq: Every day | ORAL | 0 refills | Status: DC

## 2018-10-09 MED ORDER — PRO-STAT SUGAR FREE PO LIQD: 30.0000 mL | Freq: Two times a day (BID) | ORAL | 0 refills | Status: AC

## 2018-10-09 MED ORDER — DILTIAZEM HCL 30 MG PO TABS: 30.0000 mg | ORAL_TABLET | Freq: Two times a day (BID) | ORAL | 0 refills | Status: AC

## 2018-10-09 MED ORDER — PANTOPRAZOLE SODIUM 40 MG PO TBEC: 40.0000 mg | DELAYED_RELEASE_TABLET | Freq: Two times a day (BID) | ORAL | 0 refills | Status: AC

## 2018-10-09 NOTE — Progress Notes (Signed)
Report called to [facility], spoke with Colletta Maryland RN

## 2018-10-09 NOTE — Clinical Social Work Placement (Signed)
Pt admitting to [facility] room #103-P, report [phone]. Will arrange [facility] transportation. Pt's DC information provided via the [facility]. [employer] Rebecca Ford) approved coverage- provided authorization details to facility.   CLINICAL SOCIAL WORK PLACEMENT  NOTE  Date:  10/09/2018  Patient Details  Name: Rebecca Ford MRN: [id number] Date of Birth: 21-Jul-1930  Clinical Social Work is seeking post-discharge placement for this patient at the [place] level of care (*CSW will initial, date and re-position this form in  chart as items are completed):  Yes   Patient/family provided with [place] Work Department's list of facilities offering this level of care within the geographic area requested by the patient (or if unable, by the patient's family).  Yes   Patient/family informed of their freedom to choose among providers that offer the needed level of care, that participate in Medicare, Medicaid or managed care program needed by the patient, have an available bed and are willing to accept the patient.  Yes   Patient/family informed of [facility]'s ownership interest in [facility] and [facility], as well as of the fact that they are under no obligation to receive care at these facilities.  PASRR submitted to EDS on       PASRR number received on       Existing PASRR number confirmed on 10/07/18     FL2 transmitted to all facilities in geographic area requested by pt/family on 10/07/18     FL2 transmitted to all facilities within larger geographic area on       Patient informed that his/her managed care company has contracts with or will negotiate with certain facilities, including the following:        Yes   Patient/family informed of bed offers received.  Patient chooses bed at [facility]     Physician recommends and patient chooses bed at [facility]    Patient to be transferred to [facility] on 10/09/18.  Patient to be  transferred to facility by [facility]     Patient family notified on 10/09/18 of transfer.  Name of family member notified:  daughter Rebecca Ford     PHYSICIAN       Additional Comment:    _______________________________________________ Nila Nephew, LCSW 10/09/2018, 11:48 AM [phone]

## 2018-10-09 NOTE — Discharge Summary (Signed)
Physician Discharge Summary  Rebecca Ford UXN:[id number] DOB: September 25, 1930 DOA: 10/01/2018  PCP: [name], MD  Admit date: 10/01/2018 Discharge date: 10/09/2018  Admitted From: Home (independent living) Disposition:  SNF  Recommendations for Outpatient Follow-up and new medication changes:  1. Follow up with Dr. Alyson Ingles in 7 days 2. Patient has been placed on daily furosemide along with potassium supplements. 3. Started on diltiazem for esophageal spasms. 4. Patient placed on pantoprazole for GERD 5. Added Megace for appetite stimulant.   Home Health: no   Equipment/Devices: no    Discharge Condition: stable  CODE STATUS: full  Diet recommendation: heart healthy diet.    Brief/Interim Summary: 84 year old female who presentedwith lowerextremity edema and dyspnea. She does have the significant past medical history of coronary arterydisease, hypertension,anddyslipidemia. Reported few weeks of generalized weakness, she was diagnosed with anemia as an outpatient, and prescribed iron tablets. Her symptoms kept worsening, nowassociated lower extremity edema and increased abdominal girth. On the initial physical examination blood pressure 133/66, heart rate 80, respiratory rate16, oxygen saturation 97%.Positive JVD, lungs with bilateral rales, heart S1-S2 present and rhythmic, abdomen soft, distended, no guarding, positive lower extremity edema. Sodium 132, potassium 3.7, chloride 100, bicarb 22, glucose 105, BUN 18, creatinine 0.71,BNP 109,white cell count 8.5, hemoglobin 10.2, hematocrit 32.3, platelets 319.Urinalysis negative for infection. Chest x-ray with vascular congestion and smallright pleural effusion. CT chest negative for pulmonary embolism, bilateral groundglass opacities, right pleural effusion, right thyroid nodule, 3 cm.EKG was sinus rhythm, left axis deviation, right bundle branch block.  Patient was admitted to the hospital with working diagnosis of  decompensated acute on chronic diastolic heart failure.  1.  Decompensated acute on chronic diastolic heart failure. (Present on admission) complicated by right pleural effusions.  Patient was admitted to the medical ward, she was placed on a remote telemetry monitor, received diuresis with IV furosemide, negative fluid balance was achieved with significant improvement of her symptoms.  Continue heart failure management with metoprolol and irbesartan.  Patient will take furosemide 20 mg daily to prevent volume overload.  Echocardiography showed preserved LV systolic function 60 to 25% with no wall motion abnormalities.  No significant valvulopathy.  2.  Coronary artery disease.  No angina, continue aspirin, atorvastatin, and metoprolol.  3.  Chronic iron deficiency anemia.  Iron panel showed serum iron 26, TIBC 194, transferrin saturation 13, ferritin 254.  Patient received IV iron with good toleration.  No indication for PRBC transfusion, her discharge hemoglobin is 10.3, hematocrit 31.7.  4.  GERD associated with hiatal hernia and complicated by esophageal spasms.  Patient complaint of odynophagia, she was placed on antiacids, further work-up with esophagram showed hiatal hernia and esophageal dysmotility.  Patient was placed on 30 mg of diltiazem twice daily with improvement of her symptoms.  5.  Hypertension.  Continue hydrochlorothiazide, metoprolol and irbesartan.  6.  Hypokalemia.  Likely related to diuresis, patient had potassium chloride with improvement of her electrolytes.  Potassium 3.7.  Continue potassium supplements, follow-up kidney function as an outpatient.  7.  Depression with low appetite.  Patient was continued on amitriptyline, trial of Megace has been started.  8.  Calorie pleural protein malnutrition, unspecified.  Patient was seen by nutrition, supplements have been added.  Discharge Diagnoses:  Principal Problem:   Acute CHF (congestive heart failure) (HCC) Active  Problems:   HYPERTENSION, BENIGN   CAD, NATIVE VESSEL   Syncope   Macrocytic anemia   CHF (congestive heart failure) [facility])    Discharge Instructions  Allergies as of 10/09/2018      Reactions   Codeine Other (See Comments)   Makes her severely depressed       Medication List    STOP taking these medications   oxyCODONE-acetaminophen 5-325 MG tablet Commonly known as:  PERCOCET/ROXICET   ranitidine 150 MG tablet Commonly known as:  ZANTAC   traMADol 50 MG tablet Commonly known as:  ULTRAM     TAKE these medications   alendronate 70 MG tablet Commonly known as:  FOSAMAX Take 70 mg by mouth once a week.   amitriptyline 100 MG tablet Commonly known as:  ELAVIL Take 100 mg by mouth at bedtime.   aspirin EC 81 MG tablet Take 1 tablet (81 mg total) by mouth daily.   atorvastatin 80 MG tablet Commonly known as:  LIPITOR Take 80 mg by mouth at bedtime.   diltiazem 30 MG tablet Commonly known as:  CARDIZEM Take 1 tablet (30 mg total) by mouth every 12 (twelve) hours.   feeding supplement (PRO-STAT SUGAR FREE 64) Liqd Take 30 mLs by mouth 2 (two) times daily.   fish oil-omega-3 fatty acids 1000 MG capsule Take 1 g by mouth 2 (two) times daily.   furosemide 20 MG tablet Commonly known as:  LASIX Take 20 mg by mouth daily.   hydrochlorothiazide 25 MG tablet Commonly known as:  HYDRODIURIL Take 25 mg by mouth every morning.   IRON 27 PO Take 1 tablet by mouth daily.   megestrol 400 MG/10ML suspension Commonly known as:  MEGACE Take 10 mLs (400 mg total) by mouth daily. Start taking on:  10/10/2018   metoprolol tartrate 50 MG tablet Commonly known as:  LOPRESSOR Take 50 mg by mouth 2 (two) times daily.   nitroGLYCERIN 0.4 MG SL tablet Commonly known as:  NITROSTAT Place 1 tablet (0.4 mg total) under the tongue every 5 (five) minutes as needed for chest pain.   pantoprazole 40 MG tablet Commonly known as:  PROTONIX Take 1 tablet (40 mg total) by  mouth 2 (two) times daily.   polyethylene glycol packet Commonly known as:  MIRALAX / GLYCOLAX Take 17 g by mouth daily.   potassium chloride SA 20 MEQ tablet Commonly known as:  K-DUR,KLOR-CON Take 20 mEq by mouth daily.   PRESERVISION AREDS PO Take 1 tablet by mouth 2 (two) times daily.   telmisartan 40 MG tablet Commonly known as:  MICARDIS Take 40 mg by mouth every morning.   VITAMIN D-3 PO Take 1 tablet by mouth every morning.      Contact information for after-discharge care    Destination    [facility] .   Service:  Skilled Nursing Contact information: [place] [address] [phone]             Allergies  Allergen Reactions  . Codeine Other (See Comments)    Makes her severely depressed     Consultations:     Procedures/Studies: Dg Chest 2 View  Result Date: 10/01/2018 CLINICAL DATA:  84 y/o F; 4-5 days of weakness. Shortness of breath, cough, rales. EXAM: CHEST - 2 VIEW COMPARISON:  10/31/2016 chest radiograph. FINDINGS: Stable mildly enlarged cardiac silhouette given projection and technique. Small right pleural effusion. Right lung base opacification. Left lung base platelike atelectasis. No pneumothorax. Multilevel degenerative changes of the spine. No acute osseous abnormality is evident. IMPRESSION: Small right pleural effusion and basilar opacity which may represent associated atelectasis or pneumonia. Electronically Signed  By: Kristine Garbe M.D.   On: 10/01/2018 19:25   Ct Angio Chest Pe W Or Wo Contrast  Result Date: 10/02/2018 CLINICAL DATA:  84 year old with bilateral leg edema and fatigue. Elevated D-dimer. EXAM: CT ANGIOGRAPHY CHEST WITH CONTRAST TECHNIQUE: Multidetector CT imaging of the chest was performed using the standard protocol during bolus administration of intravenous contrast. Multiplanar CT image reconstructions and MIPs were obtained to evaluate the vascular  anatomy. CONTRAST:  129mL ISOVUE-370 IOPAMIDOL (ISOVUE-370) INJECTION 76% COMPARISON:  Chest radiograph earlier this day. FINDINGS: Cardiovascular: There are no filling defects within the pulmonary arteries to suggest pulmonary embolus. Aortic atherosclerosis and tortuosity without dissection. Conventional branching pattern from the aortic arch. Mild multi chamber cardiomegaly. There are coronary artery calcifications. No significant pericardial effusion. Mediastinum/Nodes: Multiple small epicardial nodes, nonspecific. No enlarged mediastinal or hilar lymph nodes. Small to moderate hiatal hernia. Upper esophagus is patulous. 3 cm right thyroid or parathyroid nodule. Enlargement of the right thyroid lobe compared to left. Lungs/Pleura: Moderate right pleural effusion is partially loculated. Associated compressive atelectasis in the right lower and middle lobes. There is a small left pleural effusion with adjacent atelectasis. Vague ground-glass opacities with minimal septal thickening at the apices. No suspicious pulmonary mass. Upper Abdomen: Upper abdominal ascites adjacent to the liver and spleen. Musculoskeletal: Lucency in the upper sternal body with surrounding calcification, suggesting subacute fracture. Degenerative change in the spine. Review of the MIP images confirms the above findings. IMPRESSION: 1. No pulmonary embolus. 2. Moderate right pleural effusion, partially loculated. There is adjacent compressive atelectasis. Small left pleural effusion. 3. Minimal ground-glass opacities and septal thickening at the apices, possible early pulmonary edema. Multi chamber cardiomegaly. 4. Ascites in the upper abdomen. 5. Sternal lucency with surrounding calcifications, likely subacute or remote fracture. Focal lytic bone lesion is not excluded on imaging findings alone, but felt less likely particularly in the absence of known malignancy. 6. A 3 cm hypodense nodule abutting the right lobe of the thyroid gland may  be an exophytic thyroid nodule or parathyroid nodule. Thyroid ultrasound may be of value, giving consideration for patient's advanced age. 7. Aortic Atherosclerosis (ICD10-I70.0). Coronary artery calcifications. Electronically Signed   By: Keith Rake M.D.   On: 10/02/2018 05:51   Dg Esophagus  Result Date: 10/04/2018 CLINICAL DATA:  Difficulty swallowing. EXAM: ESOPHOGRAM/BARIUM SWALLOW TECHNIQUE: Single contrast examination was performed using thin barium. A barium tablet was also administered FLUOROSCOPY TIME:  Fluoroscopy Time:  2.9 minutes Radiation Exposure Index (if provided by the fluoroscopic device): 55.9 mGy Number of Acquired Spot Images: 3 COMPARISON:  None. FINDINGS: The proximal and mid esophagus are widely patent. No stricture or mass. The motility of the esophagus is abnormal with multiple disorganized tertiary waves. A large hiatal hernia is identified. This contains approximately 20% intrathoracic stomach. Transient narrowing of the distal esophagus at the level of the GE junction noted which resulted and attenuation of tablet transit into the intrathoracic portions of the stomach. After several swallows of thin barium and water the tablet eventually passed into the stomach. Mild reflux identified. IMPRESSION: 1. Large hiatal hernia. 2. Esophageal dysmotility. Electronically Signed   By: Kerby Moors M.D.   On: 10/04/2018 15:31   US Abdomen Limited  Result Date: 10/02/2018 CLINICAL DATA:  Abdominal distension EXAM: LIMITED ABDOMEN ULTRASOUND FOR ASCITES TECHNIQUE: Limited ultrasound survey for ascites was performed in all four abdominal quadrants. COMPARISON:  10/02/2018 FINDINGS: Survey of the abdominal 4 quadrants demonstrates a small to moderate amount of diffuse ascites. Right  pleural effusion also noted. IMPRESSION: Small to moderate abdominopelvic ascites Right pleural effusion Electronically Signed   By: Jerilynn Mages.  Shick M.D.   On: 10/02/2018 15:14       Subjective: Patient  is feeling much better today, no nausea or vomiting, no chest pain or dyspnea, continue to be very weak and deconditioned.   Discharge Exam: Vitals:   10/09/18 0420 10/09/18 1016  BP: 121/74 (!) 124/54  Pulse: 85 96  Resp: 18   Temp: 98.6 F (37 C)   SpO2: 92% 98%   Vitals:   10/08/18 1633 10/08/18 2024 10/09/18 0420 10/09/18 1016  BP: 108/68 (!) 115/52 121/74 (!) 124/54  Pulse: 79 96 85 96  Resp: 18 20 18    Temp: 98.2 F (36.8 C) 99.1 F (37.3 C) 98.6 F (37 C)   TempSrc: Oral Oral Oral   SpO2: 98% 97% 92% 98%  Weight:   61.4 kg   Height:        General: deconditioned  Neurology: Awake and alert, non focal  E ENT: mild pallor, no icterus, oral mucosa moist Cardiovascular: No JVD. S1-S2 present, rhythmic, no gallops, rubs, or murmurs. No lower extremity edema. Pulmonary: vesicular breath sounds bilaterally, adequate air movement, no wheezing, rhonchi or rales. Gastrointestinal. Abdomen with no, no organomegaly, non tender, no rebound or guarding Skin. No rashes Musculoskeletal: no joint deformities   The results of significant diagnostics from this hospitalization (including imaging, microbiology, ancillary and laboratory) are listed below for reference.     Microbiology: No results found for this or any previous visit (from the past 240 hour(s)).   Labs: BNP (last 3 results) Recent Labs    10/01/18 1700  BNP 762.8*   Basic Metabolic Panel: Recent Labs  Lab 10/03/18 0411 10/04/18 0429 10/05/18 0415 10/06/18 0421 10/08/18 0431  NA 134* 135 133* 131*  --   K 3.2* 4.0 3.2* 3.7  --   CL 97* 101 98 97*  --   CO2 25 26 26 24   --   GLUCOSE 108* 104* 100* 117*  --   BUN 20 17 19 23   --   CREATININE 0.83 0.71 0.75 0.91 0.85  CALCIUM 8.0* 8.3* 8.1* 8.1*  --    Liver Function Tests: No results for input(s): AST, ALT, ALKPHOS, BILITOT, PROT, ALBUMIN in the last 168 hours. No results for input(s): LIPASE, AMYLASE in the last 168 hours. No results for input(s):  AMMONIA in the last 168 hours. CBC: No results for input(s): WBC, NEUTROABS, HGB, HCT, MCV, PLT in the last 168 hours. Cardiac Enzymes: No results for input(s): CKTOTAL, CKMB, CKMBINDEX, TROPONINI in the last 168 hours. BNP: Invalid input(s): POCBNP CBG: No results for input(s): GLUCAP in the last 168 hours. D-Dimer No results for input(s): DDIMER in the last 72 hours. Hgb A1c No results for input(s): HGBA1C in the last 72 hours. Lipid Profile No results for input(s): CHOL, HDL, LDLCALC, TRIG, CHOLHDL, LDLDIRECT in the last 72 hours. Thyroid function studies No results for input(s): TSH, T4TOTAL, T3FREE, THYROIDAB in the last 72 hours.  Invalid input(s): FREET3 Anemia work up No results for input(s): VITAMINB12, FOLATE, FERRITIN, TIBC, IRON, RETICCTPCT in the last 72 hours. Urinalysis    Component Value Date/Time   COLORURINE STRAW (A) 10/01/2018 2338   APPEARANCEUR CLEAR 10/01/2018 2338   LABSPEC 1.005 10/01/2018 2338   PHURINE 7.0 10/01/2018 [place] 10/01/2018 [place] 10/01/2018 [place] 10/01/2018 2338   [place]  10/01/2018 [place] 10/01/2018 2338   NITRITE NEGATIVE 10/01/2018 2338   LEUKOCYTESUR NEGATIVE 10/01/2018 2338   Sepsis Labs Invalid input(s): PROCALCITONIN,  WBC,  LACTICIDVEN Microbiology No results found for this or any previous visit (from the past 240 hour(s)).   Time coordinating discharge: 45 minutes  SIGNED:   Tawni Millers, MD  [facility] 10/09/2018, 10:59 AM Pager [phone]  If 7PM-7AM, please contact night-coverage [email or web address] Password [email or web address]

## 2018-10-10 DIAGNOSIS — D649 Anemia, unspecified: Secondary | ICD-10-CM | POA: Diagnosis not present

## 2018-10-10 DIAGNOSIS — I509 Heart failure, unspecified: Secondary | ICD-10-CM | POA: Diagnosis not present

## 2018-10-10 DIAGNOSIS — I2510 Atherosclerotic heart disease of native coronary artery without angina pectoris: Secondary | ICD-10-CM | POA: Diagnosis not present

## 2018-10-10 DIAGNOSIS — I10 Essential (primary) hypertension: Secondary | ICD-10-CM | POA: Diagnosis not present

## 2018-10-11 DIAGNOSIS — D649 Anemia, unspecified: Secondary | ICD-10-CM | POA: Diagnosis not present

## 2018-10-11 DIAGNOSIS — R531 Weakness: Secondary | ICD-10-CM | POA: Diagnosis not present

## 2018-10-11 DIAGNOSIS — I10 Essential (primary) hypertension: Secondary | ICD-10-CM | POA: Diagnosis not present

## 2018-10-11 DIAGNOSIS — I509 Heart failure, unspecified: Secondary | ICD-10-CM | POA: Diagnosis not present

## 2018-10-15 DIAGNOSIS — D649 Anemia, unspecified: Secondary | ICD-10-CM | POA: Diagnosis not present

## 2018-10-18 DIAGNOSIS — L239 Allergic contact dermatitis, unspecified cause: Secondary | ICD-10-CM | POA: Diagnosis not present

## 2018-10-18 DIAGNOSIS — I509 Heart failure, unspecified: Secondary | ICD-10-CM | POA: Diagnosis not present

## 2018-10-18 DIAGNOSIS — I10 Essential (primary) hypertension: Secondary | ICD-10-CM | POA: Diagnosis not present

## 2018-10-22 DIAGNOSIS — L239 Allergic contact dermatitis, unspecified cause: Secondary | ICD-10-CM | POA: Diagnosis not present

## 2018-10-22 DIAGNOSIS — I509 Heart failure, unspecified: Secondary | ICD-10-CM | POA: Diagnosis not present

## 2018-10-22 DIAGNOSIS — I10 Essential (primary) hypertension: Secondary | ICD-10-CM | POA: Diagnosis not present

## 2018-10-23 DIAGNOSIS — R4689 Other symptoms and signs involving appearance and behavior: Secondary | ICD-10-CM | POA: Diagnosis not present

## 2018-10-23 DIAGNOSIS — L239 Allergic contact dermatitis, unspecified cause: Secondary | ICD-10-CM | POA: Diagnosis not present

## 2018-10-23 DIAGNOSIS — R410 Disorientation, unspecified: Secondary | ICD-10-CM | POA: Diagnosis not present

## 2018-10-23 DIAGNOSIS — R4189 Other symptoms and signs involving cognitive functions and awareness: Secondary | ICD-10-CM | POA: Diagnosis not present

## 2018-10-24 ENCOUNTER — Other Ambulatory Visit: Payer: Self-pay

## 2018-10-24 ENCOUNTER — Emergency Department (HOSPITAL_COMMUNITY): Payer: Medicare HMO

## 2018-10-24 ENCOUNTER — Inpatient Hospital Stay (HOSPITAL_COMMUNITY)
Admission: EM | Admit: 2018-10-24 | Discharge: 2018-11-04 | DRG: 070 | Disposition: A | Discharge status: Skilled Nursing Facility | Payer: Medicare HMO | Source: Skilled Nursing Facility | Attending: Internal Medicine | Admitting: Internal Medicine

## 2018-10-24 ENCOUNTER — Encounter (HOSPITAL_COMMUNITY): Payer: Self-pay | Admitting: Emergency Medicine

## 2018-10-24 DIAGNOSIS — E43 Unspecified severe protein-calorie malnutrition: Secondary | ICD-10-CM | POA: Diagnosis not present

## 2018-10-24 DIAGNOSIS — Z66 Do not resuscitate: Secondary | ICD-10-CM | POA: Diagnosis present

## 2018-10-24 DIAGNOSIS — E861 Hypovolemia: Secondary | ICD-10-CM | POA: Diagnosis present

## 2018-10-24 DIAGNOSIS — G9341 Metabolic encephalopathy: Principal | ICD-10-CM | POA: Diagnosis present

## 2018-10-24 DIAGNOSIS — I252 Old myocardial infarction: Secondary | ICD-10-CM | POA: Diagnosis not present

## 2018-10-24 DIAGNOSIS — R627 Adult failure to thrive: Secondary | ICD-10-CM | POA: Diagnosis present

## 2018-10-24 DIAGNOSIS — Z885 Allergy status to narcotic agent status: Secondary | ICD-10-CM

## 2018-10-24 DIAGNOSIS — Z9842 Cataract extraction status, left eye: Secondary | ICD-10-CM

## 2018-10-24 DIAGNOSIS — J90 Pleural effusion, not elsewhere classified: Secondary | ICD-10-CM | POA: Diagnosis not present

## 2018-10-24 DIAGNOSIS — Z96651 Presence of right artificial knee joint: Secondary | ICD-10-CM | POA: Diagnosis present

## 2018-10-24 DIAGNOSIS — Z7401 Bed confinement status: Secondary | ICD-10-CM | POA: Diagnosis not present

## 2018-10-24 DIAGNOSIS — I2510 Atherosclerotic heart disease of native coronary artery without angina pectoris: Secondary | ICD-10-CM | POA: Diagnosis present

## 2018-10-24 DIAGNOSIS — M1711 Unilateral primary osteoarthritis, right knee: Secondary | ICD-10-CM | POA: Diagnosis present

## 2018-10-24 DIAGNOSIS — N390 Urinary tract infection, site not specified: Secondary | ICD-10-CM | POA: Diagnosis present

## 2018-10-24 DIAGNOSIS — E860 Dehydration: Secondary | ICD-10-CM | POA: Diagnosis not present

## 2018-10-24 DIAGNOSIS — R443 Hallucinations, unspecified: Secondary | ICD-10-CM | POA: Diagnosis not present

## 2018-10-24 DIAGNOSIS — J189 Pneumonia, unspecified organism: Secondary | ICD-10-CM | POA: Diagnosis not present

## 2018-10-24 DIAGNOSIS — M2550 Pain in unspecified joint: Secondary | ICD-10-CM | POA: Diagnosis not present

## 2018-10-24 DIAGNOSIS — Z90711 Acquired absence of uterus with remaining cervical stump: Secondary | ICD-10-CM

## 2018-10-24 DIAGNOSIS — I5032 Chronic diastolic (congestive) heart failure: Secondary | ICD-10-CM | POA: Diagnosis present

## 2018-10-24 DIAGNOSIS — T385X5A Adverse effect of other estrogens and progestogens, initial encounter: Secondary | ICD-10-CM | POA: Diagnosis not present

## 2018-10-24 DIAGNOSIS — I110 Hypertensive heart disease with heart failure: Secondary | ICD-10-CM | POA: Diagnosis present

## 2018-10-24 DIAGNOSIS — R410 Disorientation, unspecified: Secondary | ICD-10-CM | POA: Diagnosis not present

## 2018-10-24 DIAGNOSIS — F05 Delirium due to known physiological condition: Secondary | ICD-10-CM | POA: Diagnosis not present

## 2018-10-24 DIAGNOSIS — N179 Acute kidney failure, unspecified: Secondary | ICD-10-CM | POA: Diagnosis not present

## 2018-10-24 DIAGNOSIS — I509 Heart failure, unspecified: Secondary | ICD-10-CM

## 2018-10-24 DIAGNOSIS — N19 Unspecified kidney failure: Secondary | ICD-10-CM | POA: Diagnosis not present

## 2018-10-24 DIAGNOSIS — Z79899 Other long term (current) drug therapy: Secondary | ICD-10-CM

## 2018-10-24 DIAGNOSIS — R21 Rash and other nonspecific skin eruption: Secondary | ICD-10-CM

## 2018-10-24 DIAGNOSIS — R0902 Hypoxemia: Secondary | ICD-10-CM | POA: Diagnosis not present

## 2018-10-24 DIAGNOSIS — Z8249 Family history of ischemic heart disease and other diseases of the circulatory system: Secondary | ICD-10-CM | POA: Diagnosis not present

## 2018-10-24 DIAGNOSIS — R000 Tachycardia, unspecified: Secondary | ICD-10-CM | POA: Diagnosis present

## 2018-10-24 DIAGNOSIS — M6281 Muscle weakness (generalized): Secondary | ICD-10-CM | POA: Diagnosis not present

## 2018-10-24 DIAGNOSIS — Z9841 Cataract extraction status, right eye: Secondary | ICD-10-CM

## 2018-10-24 DIAGNOSIS — R5381 Other malaise: Secondary | ICD-10-CM | POA: Diagnosis present

## 2018-10-24 DIAGNOSIS — Z961 Presence of intraocular lens: Secondary | ICD-10-CM | POA: Diagnosis present

## 2018-10-24 DIAGNOSIS — I444 Left anterior fascicular block: Secondary | ICD-10-CM | POA: Diagnosis not present

## 2018-10-24 DIAGNOSIS — D72829 Elevated white blood cell count, unspecified: Secondary | ICD-10-CM | POA: Diagnosis not present

## 2018-10-24 DIAGNOSIS — L299 Pruritus, unspecified: Secondary | ICD-10-CM | POA: Diagnosis not present

## 2018-10-24 DIAGNOSIS — R1310 Dysphagia, unspecified: Secondary | ICD-10-CM | POA: Diagnosis present

## 2018-10-24 DIAGNOSIS — E785 Hyperlipidemia, unspecified: Secondary | ICD-10-CM | POA: Diagnosis present

## 2018-10-24 DIAGNOSIS — R0602 Shortness of breath: Secondary | ICD-10-CM

## 2018-10-24 DIAGNOSIS — Z6824 Body mass index (BMI) 24.0-24.9, adult: Secondary | ICD-10-CM | POA: Diagnosis not present

## 2018-10-24 DIAGNOSIS — D509 Iron deficiency anemia, unspecified: Secondary | ICD-10-CM | POA: Diagnosis present

## 2018-10-24 DIAGNOSIS — F0390 Unspecified dementia without behavioral disturbance: Secondary | ICD-10-CM | POA: Diagnosis not present

## 2018-10-24 DIAGNOSIS — R32 Unspecified urinary incontinence: Secondary | ICD-10-CM | POA: Diagnosis present

## 2018-10-24 DIAGNOSIS — X58XXXA Exposure to other specified factors, initial encounter: Secondary | ICD-10-CM | POA: Diagnosis present

## 2018-10-24 DIAGNOSIS — R4182 Altered mental status, unspecified: Secondary | ICD-10-CM | POA: Diagnosis not present

## 2018-10-24 DIAGNOSIS — Z7983 Long term (current) use of bisphosphonates: Secondary | ICD-10-CM | POA: Diagnosis not present

## 2018-10-24 DIAGNOSIS — I5030 Unspecified diastolic (congestive) heart failure: Secondary | ICD-10-CM | POA: Diagnosis not present

## 2018-10-24 DIAGNOSIS — G92 Toxic encephalopathy: Secondary | ICD-10-CM | POA: Diagnosis not present

## 2018-10-24 DIAGNOSIS — I10 Essential (primary) hypertension: Secondary | ICD-10-CM | POA: Diagnosis present

## 2018-10-24 DIAGNOSIS — Z9181 History of falling: Secondary | ICD-10-CM

## 2018-10-24 DIAGNOSIS — Z7982 Long term (current) use of aspirin: Secondary | ICD-10-CM | POA: Diagnosis not present

## 2018-10-24 DIAGNOSIS — R55 Syncope and collapse: Secondary | ICD-10-CM | POA: Diagnosis not present

## 2018-10-24 LAB — COMPREHENSIVE METABOLIC PANEL
ALK PHOS: 148 U/L — AB (ref 38–126)
ALT: 30 U/L (ref 0–44)
AST: 39 U/L (ref 15–41)
Albumin: 2.6 g/dL — ABNORMAL LOW (ref 3.5–5.0)
Anion gap: 11 (ref 5–15)
BILIRUBIN TOTAL: 0.7 mg/dL (ref 0.3–1.2)
BUN: 75 mg/dL — AB (ref 8–23)
CALCIUM: 9.5 mg/dL (ref 8.9–10.3)
CHLORIDE: 109 mmol/L (ref 98–111)
CO2: 20 mmol/L — ABNORMAL LOW (ref 22–32)
Creatinine, Ser: 1.68 mg/dL — ABNORMAL HIGH (ref 0.44–1.00)
GFR calc Af Amer: 31 mL/min — ABNORMAL LOW (ref >60)
GFR, EST NON AFRICAN AMERICAN: 26 mL/min — AB (ref >60)
Glucose, Bld: 117 mg/dL — ABNORMAL HIGH (ref 70–99)
Potassium: 3.6 mmol/L (ref 3.5–5.1)
Sodium: 140 mmol/L (ref 135–145)
Total Protein: 7 g/dL (ref 6.5–8.1)

## 2018-10-24 LAB — CBC
HEMATOCRIT: 37.1 % (ref 36.0–46.0)
Hemoglobin: 11.8 g/dL — ABNORMAL LOW (ref 12.0–15.0)
MCH: 32.8 pg (ref 26.0–34.0)
MCHC: 31.8 g/dL (ref 30.0–36.0)
MCV: 103.1 fL — AB (ref 80.0–100.0)
NRBC: 0 % (ref 0.0–0.2)
PLATELETS: 282 10*3/uL (ref 150–400)
RBC: 3.6 MIL/uL — AB (ref 3.87–5.11)
RDW: 13.5 % (ref 11.5–15.5)
WBC: 20.5 10*3/uL — ABNORMAL HIGH (ref 4.0–10.5)

## 2018-10-24 MED ORDER — SODIUM CHLORIDE 0.9 % IV SOLN: 1000.0000 mL | INTRAVENOUS | Status: DC

## 2018-10-24 MED ORDER — PREDNISONE 20 MG PO TABS
40.0000 mg | ORAL_TABLET | Freq: Once | ORAL | Status: AC
  Administered 2018-10-24: 40 mg via ORAL
  Filled 2018-10-24: qty 2

## 2018-10-24 MED ORDER — SODIUM CHLORIDE 0.9 % IV BOLUS (SEPSIS)
500.0000 mL | Freq: Once | INTRAVENOUS | Status: AC
  Administered 2018-10-25: 500 mL via INTRAVENOUS

## 2018-10-24 MED ORDER — SODIUM CHLORIDE 0.9 % IV SOLN
INTRAVENOUS | Status: DC
  Administered 2018-10-24: 22:00:00 via INTRAVENOUS

## 2018-10-24 NOTE — ED Notes (Signed)
Bed: SU11 Expected date:  Expected time:  Means of arrival:  Comments: Venita Sheffield

## 2018-10-24 NOTE — ED Notes (Signed)
Patient went to xray 

## 2018-10-24 NOTE — ED Notes (Signed)
Patient is on 2L [place] due to O2 sat - 82 %

## 2018-10-24 NOTE — ED Triage Notes (Signed)
Patient complaining of a rash that progressed since nov. 8. Patient has rash all on her body. On Oct 31 patient was started on megace and Cardizem. These are medications that is new. Patient is allergic to codeine. PIV #18 in left AC. 15 mg of benadryl IV.

## 2018-10-24 NOTE — ED Notes (Signed)
Pt changed and put on a purewick

## 2018-10-24 NOTE — ED Notes (Signed)
Bed: [facility] Expected date:  Expected time:  Means of arrival:  Comments: EMS 84 yo female from SNF with hives

## 2018-10-24 NOTE — ED Provider Notes (Signed)
[facility] DEPT Provider Note   CSN: [id number] Arrival date & time: 10/24/18  1949     History   Chief Complaint Chief Complaint  Patient presents with  . Allergic Reaction    HPI Rebecca Ford is a 84 y.o. female.  HPI Patient presents to the emergency room for evaluation of a rash.  Patient was started on new medications including Megace and Cardizem on October 31.  Patient started having a rash on November 8.  Since that time the rash has persisted.  This evening rash became more severe and the patient was very itchy.  Nursing home staff felt it was consistent with hives.  EMS was called.  They gave her Benadryl on route.  She denies any difficulty breathing or swallowing.  She does have dementia and has difficulty remembering what actually brought her to the emergency room but does she does know that she has the rash and that it is itching  Daughter provided additional history.  She is also concerned about the pt's confusion.  This has been getting worse.  She is becoming agitated and forgetful.  She has also fallen recently.  This is all new since she [facility] [place] [facility] the [place] of [place].  [name] current in an independent living portion of a rehab facility. Past Medical History:  Diagnosis Date  . Arthritis   . CAD (coronary artery disease)    s/p cath 09/07/10 w/3 vessel CAD, DES in RCA, Diagonal & LAD  . Dementia ([place])   . HTN (hypertension)   . Hyperlipemia   . MI (myocardial infarction) ([place])    Non-ST elevation 9/11  . Primary osteoarthritis of right knee 09/2016    Patient Active Problem List   Diagnosis Date Noted  . CHF (congestive heart failure) ([place]) 10/02/2018  . Acute CHF (congestive heart failure) ([place]) 10/01/2018  . Macrocytic anemia 10/01/2018  . Chronic depression 12/03/2016  . Localized osteoarthritis of right knee 11/13/2016  . Primary osteoarthritis of right knee 12020/11/2316  . Syncope 11/16/2015  . Tobacco abuse,  in remission 06/04/2014  . HYPERTENSION, BENIGN 09/20/2010  . CAD, NATIVE VESSEL 09/20/2010    Past Surgical History:  Procedure Laterality Date  . CARDIAC CATHETERIZATION     YRS AGO   . CATARACT EXTRACTION W/ INTRAOCULAR LENS  IMPLANT, BILATERAL    . PARTIAL HYSTERECTOMY    . RETINAL DETACHMENT SURGERY     LEFT  . TOTAL KNEE ARTHROPLASTY Right 11/13/2016   Procedure: TOTAL KNEE ARTHROPLASTY;  Surgeon: Frederik Pear, MD;  Location: [facility];  Service: Orthopedics;  Laterality: Right;     OB History   None      Home Medications    Prior to Admission medications   Medication Sig Start Date End Date Taking? Authorizing Provider  acetaminophen (TYLENOL) 325 MG tablet Take 650 mg by mouth every 6 (six) hours as needed for mild pain.   Yes [provider]  alendronate (FOSAMAX) 70 MG tablet Take 70 mg by mouth once a week. 08/31/18  Yes [provider]  Amino Acids-Protein Hydrolys (FEEDING SUPPLEMENT, PRO-STAT SUGAR FREE 64,) LIQD Take 30 mLs by mouth 2 (two) times daily. 10/09/18 11/08/18 Yes Arrien, Jimmy Picket, MD  amitriptyline (ELAVIL) 100 MG tablet Take 100 mg by mouth at bedtime. 08/09/16  Yes [provider]  aspirin EC 81 MG tablet Take 1 tablet (81 mg total) by mouth daily. 05/03/17  Yes Burnell Blanks, MD  atorvastatin (LIPITOR) 80  MG tablet Take 80 mg by mouth at bedtime.    Yes [provider]  cetirizine (ZYRTEC) 10 MG tablet Take 10 mg by mouth daily.   Yes [provider]  Cholecalciferol (VITAMIN D-3 PO) Take 1 tablet by mouth every morning.    Yes [provider]  diltiazem (CARDIZEM) 30 MG tablet Take 1 tablet (30 mg total) by mouth every 12 (twelve) hours. 10/09/18 11/08/18 Yes Arrien, Jimmy Picket, MD  Ferrous Gluconate (IRON 27 PO) Take 1 tablet by mouth daily.   Yes [provider]  fish oil-omega-3 fatty acids 1000 MG capsule Take 1 g by mouth 2 (two) times daily.     Yes [provider]  furosemide (LASIX) 20 MG tablet Take 20 mg by mouth daily.    Yes [provider]  hydrochlorothiazide (HYDRODIURIL) 25 MG tablet Take 25 mg by mouth every morning. 08/28/18  Yes [provider]  hydrOXYzine (ATARAX/VISTARIL) 25 MG tablet Take 25 mg by mouth every 8 (eight) hours as needed for itching.   Yes [provider]  metoprolol (LOPRESSOR) 50 MG tablet Take 50 mg by mouth 2 (two) times daily.  03/28/14  Yes [provider]  Multiple Vitamins-Minerals (PRESERVISION AREDS PO) Take 1 tablet by mouth 2 (two) times daily.    Yes [provider]  nitroGLYCERIN (NITROSTAT) 0.4 MG SL tablet Place 1 tablet (0.4 mg total) under the tongue every 5 (five) minutes as needed for chest pain. 06/04/14  Yes Burnell Blanks, MD  pantoprazole (PROTONIX) 40 MG tablet Take 1 tablet (40 mg total) by mouth 2 (two) times daily. 10/09/18 11/08/18 Yes Arrien, Jimmy Picket, MD  polyethylene glycol [facility] / Floria Raveling) packet Take 17 g by mouth daily.   Yes [provider]  potassium chloride SA (K-DUR,KLOR-CON) 20 MEQ tablet Take 20 mEq by mouth daily. 11/01/16  Yes [provider]  telmisartan (MICARDIS) 40 MG tablet Take 40 mg by mouth every morning. 08/24/18  Yes [provider]  megestrol (MEGACE) 400 MG/10ML suspension Take 10 mLs (400 mg total) by mouth daily. Patient not taking: Reported on 10/24/2018 10/10/18 11/09/18  Arrien, Jimmy Picket, MD    Family History Family History  Problem Relation Age of Onset  . Heart attack Mother   . Coronary artery disease Other        diagnosis of CAD but not premature diagnosis  . Coronary artery disease Brother     Social History Social History   Tobacco Use  . Smoking status: Never Smoker  . Smokeless tobacco: Never Used  Substance Use Topics  . Alcohol use: No  . Drug use: No     Allergies   Codeine   Review of Systems Review of Systems  All other  systems reviewed and are negative.    Physical Exam Updated Vital Signs BP (!) 126/114   Pulse (!) 110   Temp 98.3 F (36.8 C) (Oral)   Resp 20   Ht 1.575 m (5\' 2" )   Wt 61.4 kg   SpO2 100%   BMI 24.76 kg/m   Physical Exam  Constitutional: No distress.  Elderly, frail  HENT:  Head: Normocephalic and atraumatic.  Right Ear: External ear normal.  Left Ear: External ear normal.  No oropharyngeal swelling  Eyes: Conjunctivae are normal. Right eye exhibits no discharge. Left eye exhibits no discharge. No scleral icterus.  Neck: Neck supple. No tracheal deviation present.  Cardiovascular: Normal rate, regular rhythm and intact distal pulses.  Pulmonary/Chest: Effort normal and breath sounds normal. No stridor. No respiratory distress. She has no wheezes. She has no rales.  Abdominal: Soft. Bowel sounds are normal. She exhibits no distension. There is no tenderness. There is no rebound and no guarding.  Musculoskeletal: She exhibits no edema or tenderness.  Neurological: She is alert. She has normal strength. No cranial nerve deficit (no facial droop, extraocular movements intact, no slurred speech) or sensory deficit. She exhibits normal muscle tone. She displays no seizure activity. Coordination normal.  Skin: Skin is warm and dry. Rash noted.  Psychiatric: She has a normal mood and affect.  Nursing note and vitals reviewed.    ED Treatments / Results  Labs (all labs ordered are listed, but only abnormal results are displayed) Labs Reviewed  CBC - Abnormal; Notable for the following components:      Result Value   WBC 20.5 (*)    RBC 3.60 (*)    Hemoglobin 11.8 (*)    MCV 103.1 (*)    All other components within normal limits  COMPREHENSIVE METABOLIC PANEL - Abnormal; Notable for the following components:   CO2 20 (*)    Glucose, Bld 117 (*)    BUN 75 (*)    Creatinine, Ser 1.68 (*)    Albumin 2.6 (*)    Alkaline Phosphatase 148 (*)    GFR calc non Af Amer 26 (*)      GFR calc Af Amer 31 (*)    All other components within normal limits  URINE CULTURE  URINALYSIS, ROUTINE W REFLEX MICROSCOPIC    EKG None  Radiology Dg Chest 2 View  Result Date: 10/24/2018 CLINICAL DATA:  84 y/o  F; progressive rash all over the body. EXAM: CHEST - 2 VIEW COMPARISON:  10/01/2018 chest radiograph. 10/04/2018 esophagram. FINDINGS: Stable cardiac silhouette given projection and technique. Stable small right-sided pleural effusion and basilar opacity. No new consolidation. No pneumothorax. Stable hiatal hernia. No acute osseous abnormality is evident. IMPRESSION: Stable small right-sided pleural effusion and basilar opacity. Stable hiatal hernia. No new consolidation. Electronically Signed   By: Kristine Garbe M.D.   On: 10/24/2018 22:01   Ct Head Wo Contrast  Result Date: 10/24/2018 CLINICAL DATA:  Acute onset of altered mental status. Confusion and hallucinations. Found down. EXAM: CT HEAD WITHOUT CONTRAST TECHNIQUE: Contiguous axial images were obtained from the base of the skull through the vertex without intravenous contrast. COMPARISON:  None. FINDINGS: Brain: No evidence of acute infarction, hemorrhage, hydrocephalus, extra-axial collection or mass lesion / mass effect. Evaluation is somewhat suboptimal due to motion artifact at the vertex. Prominence of the ventricles and sulci reflects mild cortical volume loss. Scattered periventricular and subcortical white matter change likely reflects small vessel ischemic microangiopathy. The brainstem and fourth ventricle are within normal limits. The basal ganglia are unremarkable in appearance. The cerebral hemispheres demonstrate grossly normal gray-white differentiation. No mass effect or midline shift is seen. Vascular: No hyperdense vessel or unexpected calcification. Skull: There is no evidence of fracture; visualized osseous structures are unremarkable in appearance. Sinuses/Orbits: The orbits are within normal  limits. The paranasal sinuses and mastoid air cells are well-aerated. Other: No significant soft tissue abnormalities are seen. IMPRESSION: 1. No acute intracranial pathology seen on CT. 2. Mild cortical volume loss and scattered small vessel ischemic microangiopathy. Electronically Signed   By: Garald Balding M.D.   On: 10/24/2018 22:24    Procedures Procedures (including critical care time)  Medications Ordered in ED Medications  0.9 %  sodium chloride infusion ( Intravenous New Bag/Given 10/24/18 2220)  sodium chloride 0.9 % bolus 500 mL (has no administration in time range)    Followed by  0.9 %  sodium chloride infusion (has no administration in time range)  predniSONE (DELTASONE) tablet 40 mg (40 mg Oral Given 10/24/18 2129)     Initial Impression / Assessment and Plan / ED Course  I have reviewed the triage vital signs and the nursing notes.  Pertinent labs & imaging results that were available during my care of the patient were reviewed by me and considered in my medical decision making (see chart for details).   Pt presents to the ED for evaluation of persistent rash and confusion.  Labs notable for uremia and increased wbc.  ?dehydration related to decreased po intake.  Unclear source of the leukocytosis.  Urine is pending.  Will admit for further workup.  Final Clinical Impressions(s) / ED Diagnoses   Final diagnoses:  Dehydration  AKI (acute kidney injury) ([place])  Leukocytosis, unspecified type  Uremia  Rash     Dorie Rank, MD 10/25/18 [phone]

## 2018-10-25 DIAGNOSIS — T385X5A Adverse effect of other estrogens and progestogens, initial encounter: Secondary | ICD-10-CM | POA: Diagnosis present

## 2018-10-25 DIAGNOSIS — E43 Unspecified severe protein-calorie malnutrition: Secondary | ICD-10-CM | POA: Diagnosis present

## 2018-10-25 DIAGNOSIS — M1711 Unilateral primary osteoarthritis, right knee: Secondary | ICD-10-CM | POA: Diagnosis present

## 2018-10-25 DIAGNOSIS — Z961 Presence of intraocular lens: Secondary | ICD-10-CM | POA: Diagnosis present

## 2018-10-25 DIAGNOSIS — Z7983 Long term (current) use of bisphosphonates: Secondary | ICD-10-CM | POA: Diagnosis not present

## 2018-10-25 DIAGNOSIS — Z7982 Long term (current) use of aspirin: Secondary | ICD-10-CM | POA: Diagnosis not present

## 2018-10-25 DIAGNOSIS — Z96651 Presence of right artificial knee joint: Secondary | ICD-10-CM | POA: Diagnosis present

## 2018-10-25 DIAGNOSIS — R21 Rash and other nonspecific skin eruption: Secondary | ICD-10-CM | POA: Diagnosis present

## 2018-10-25 DIAGNOSIS — N19 Unspecified kidney failure: Secondary | ICD-10-CM | POA: Diagnosis not present

## 2018-10-25 DIAGNOSIS — F0390 Unspecified dementia without behavioral disturbance: Secondary | ICD-10-CM | POA: Diagnosis not present

## 2018-10-25 DIAGNOSIS — R410 Disorientation, unspecified: Secondary | ICD-10-CM | POA: Diagnosis not present

## 2018-10-25 DIAGNOSIS — Z6824 Body mass index (BMI) 24.0-24.9, adult: Secondary | ICD-10-CM | POA: Diagnosis not present

## 2018-10-25 DIAGNOSIS — I110 Hypertensive heart disease with heart failure: Secondary | ICD-10-CM | POA: Diagnosis present

## 2018-10-25 DIAGNOSIS — I10 Essential (primary) hypertension: Secondary | ICD-10-CM

## 2018-10-25 DIAGNOSIS — I2510 Atherosclerotic heart disease of native coronary artery without angina pectoris: Secondary | ICD-10-CM

## 2018-10-25 DIAGNOSIS — G9341 Metabolic encephalopathy: Secondary | ICD-10-CM | POA: Diagnosis present

## 2018-10-25 DIAGNOSIS — Z8249 Family history of ischemic heart disease and other diseases of the circulatory system: Secondary | ICD-10-CM | POA: Diagnosis not present

## 2018-10-25 DIAGNOSIS — R627 Adult failure to thrive: Secondary | ICD-10-CM | POA: Diagnosis present

## 2018-10-25 DIAGNOSIS — Z9841 Cataract extraction status, right eye: Secondary | ICD-10-CM | POA: Diagnosis not present

## 2018-10-25 DIAGNOSIS — D509 Iron deficiency anemia, unspecified: Secondary | ICD-10-CM | POA: Diagnosis present

## 2018-10-25 DIAGNOSIS — N179 Acute kidney failure, unspecified: Secondary | ICD-10-CM | POA: Diagnosis present

## 2018-10-25 DIAGNOSIS — I5030 Unspecified diastolic (congestive) heart failure: Secondary | ICD-10-CM

## 2018-10-25 DIAGNOSIS — I5032 Chronic diastolic (congestive) heart failure: Secondary | ICD-10-CM | POA: Diagnosis present

## 2018-10-25 DIAGNOSIS — D72829 Elevated white blood cell count, unspecified: Secondary | ICD-10-CM | POA: Diagnosis not present

## 2018-10-25 DIAGNOSIS — E860 Dehydration: Secondary | ICD-10-CM | POA: Diagnosis present

## 2018-10-25 DIAGNOSIS — E785 Hyperlipidemia, unspecified: Secondary | ICD-10-CM | POA: Diagnosis present

## 2018-10-25 DIAGNOSIS — Z885 Allergy status to narcotic agent status: Secondary | ICD-10-CM | POA: Diagnosis not present

## 2018-10-25 DIAGNOSIS — N390 Urinary tract infection, site not specified: Secondary | ICD-10-CM | POA: Diagnosis present

## 2018-10-25 DIAGNOSIS — Z9842 Cataract extraction status, left eye: Secondary | ICD-10-CM | POA: Diagnosis not present

## 2018-10-25 DIAGNOSIS — X58XXXA Exposure to other specified factors, initial encounter: Secondary | ICD-10-CM | POA: Diagnosis present

## 2018-10-25 DIAGNOSIS — F05 Delirium due to known physiological condition: Secondary | ICD-10-CM | POA: Diagnosis not present

## 2018-10-25 DIAGNOSIS — I252 Old myocardial infarction: Secondary | ICD-10-CM | POA: Diagnosis not present

## 2018-10-25 LAB — CBC WITH DIFFERENTIAL/PLATELET
Abs Immature Granulocytes: 0.14 10*3/uL — ABNORMAL HIGH (ref 0.00–0.07)
BASOS PCT: 0 %
Basophils Absolute: 0 10*3/uL (ref 0.0–0.1)
EOS ABS: 0.6 10*3/uL — AB (ref 0.0–0.5)
EOS PCT: 4 %
HCT: 33.5 % — ABNORMAL LOW (ref 36.0–46.0)
HEMOGLOBIN: 10.8 g/dL — AB (ref 12.0–15.0)
Immature Granulocytes: 1 %
LYMPHS ABS: 0.8 10*3/uL (ref 0.7–4.0)
Lymphocytes Relative: 5 %
MCH: 32.4 pg (ref 26.0–34.0)
MCHC: 32.2 g/dL (ref 30.0–36.0)
MCV: 100.6 fL — ABNORMAL HIGH (ref 80.0–100.0)
Monocytes Absolute: 0.1 10*3/uL (ref 0.1–1.0)
Monocytes Relative: 1 %
NRBC: 0 % (ref 0.0–0.2)
Neutro Abs: 14.7 10*3/uL — ABNORMAL HIGH (ref 1.7–7.7)
Neutrophils Relative %: 89 %
Platelets: 252 10*3/uL (ref 150–400)
RBC: 3.33 MIL/uL — ABNORMAL LOW (ref 3.87–5.11)
RDW: 13.4 % (ref 11.5–15.5)
WBC: 16.4 10*3/uL — AB (ref 4.0–10.5)

## 2018-10-25 LAB — COMPREHENSIVE METABOLIC PANEL
ALK PHOS: 138 U/L — AB (ref 38–126)
ALT: 29 U/L (ref 0–44)
ANION GAP: 10 (ref 5–15)
AST: 34 U/L (ref 15–41)
Albumin: 2.4 g/dL — ABNORMAL LOW (ref 3.5–5.0)
BUN: 70 mg/dL — ABNORMAL HIGH (ref 8–23)
CO2: 19 mmol/L — AB (ref 22–32)
Calcium: 9.1 mg/dL (ref 8.9–10.3)
Chloride: 113 mmol/L — ABNORMAL HIGH (ref 98–111)
Creatinine, Ser: 1.54 mg/dL — ABNORMAL HIGH (ref 0.44–1.00)
GFR calc non Af Amer: 29 mL/min — ABNORMAL LOW (ref >60)
GFR, EST AFRICAN AMERICAN: 34 mL/min — AB (ref >60)
Glucose, Bld: 130 mg/dL — ABNORMAL HIGH (ref 70–99)
POTASSIUM: 3.8 mmol/L (ref 3.5–5.1)
SODIUM: 142 mmol/L (ref 135–145)
TOTAL PROTEIN: 6.5 g/dL (ref 6.5–8.1)
Total Bilirubin: 0.6 mg/dL (ref 0.3–1.2)

## 2018-10-25 LAB — URINALYSIS, ROUTINE W REFLEX MICROSCOPIC
Bilirubin Urine: NEGATIVE
GLUCOSE, UA: NEGATIVE mg/dL
Hgb urine dipstick: NEGATIVE
KETONES UR: NEGATIVE mg/dL
Nitrite: NEGATIVE
PROTEIN: NEGATIVE mg/dL
Specific Gravity, Urine: 1.015 (ref 1.005–1.030)
pH: 5 (ref 5.0–8.0)

## 2018-10-25 LAB — MRSA PCR SCREENING: MRSA by PCR: NEGATIVE

## 2018-10-25 LAB — CREATININE, SERUM
CREATININE: 1.7 mg/dL — AB (ref 0.44–1.00)
GFR, EST AFRICAN AMERICAN: 30 mL/min — AB (ref >60)
GFR, EST NON AFRICAN AMERICAN: 26 mL/min — AB (ref >60)

## 2018-10-25 MED ORDER — NITROGLYCERIN 0.4 MG SL SUBL: 0.4000 mg | SUBLINGUAL_TABLET | SUBLINGUAL | Status: DC | PRN

## 2018-10-25 MED ORDER — DIPHENHYDRAMINE HCL 12.5 MG/5ML PO ELIX
12.5000 mg | ORAL_SOLUTION | Freq: Three times a day (TID) | ORAL | Status: DC | PRN
  Administered 2018-10-26 – 2018-10-28 (×4): 12.5 mg via ORAL
  Filled 2018-10-25 (×4): qty 5

## 2018-10-25 MED ORDER — MAGIC MOUTHWASH W/LIDOCAINE
5.0000 mL | Freq: Three times a day (TID) | ORAL | Status: DC | PRN
  Administered 2018-10-26 – 2018-10-27 (×2): 5 mL via ORAL
  Filled 2018-10-25 (×3): qty 5

## 2018-10-25 MED ORDER — PANTOPRAZOLE SODIUM 40 MG PO TBEC
40.0000 mg | DELAYED_RELEASE_TABLET | Freq: Two times a day (BID) | ORAL | Status: DC
  Administered 2018-10-25 – 2018-11-04 (×21): 40 mg via ORAL
  Filled 2018-10-25 (×22): qty 1

## 2018-10-25 MED ORDER — HALOPERIDOL LACTATE 5 MG/ML IJ SOLN
0.5000 mg | Freq: Four times a day (QID) | INTRAMUSCULAR | Status: DC | PRN
  Administered 2018-10-25 – 2018-10-31 (×2): 0.5 mg via INTRAVENOUS
  Filled 2018-10-25 (×2): qty 1

## 2018-10-25 MED ORDER — LORAZEPAM 0.5 MG PO TABS: 0.5000 mg | ORAL_TABLET | Freq: Every evening | ORAL | Status: DC | PRN

## 2018-10-25 MED ORDER — DIPHENHYDRAMINE HCL 50 MG/ML IJ SOLN
12.5000 mg | Freq: Three times a day (TID) | INTRAMUSCULAR | Status: DC | PRN
  Administered 2018-10-25: 12.5 mg via INTRAVENOUS
  Filled 2018-10-25: qty 1

## 2018-10-25 MED ORDER — METOPROLOL TARTRATE 25 MG PO TABS
50.0000 mg | ORAL_TABLET | Freq: Two times a day (BID) | ORAL | Status: DC
  Administered 2018-10-25 – 2018-11-04 (×21): 50 mg via ORAL
  Filled 2018-10-25 (×22): qty 2

## 2018-10-25 MED ORDER — ATORVASTATIN CALCIUM 40 MG PO TABS
40.0000 mg | ORAL_TABLET | Freq: Every day | ORAL | Status: DC
  Administered 2018-10-25 – 2018-11-03 (×11): 40 mg via ORAL
  Filled 2018-10-25 (×11): qty 1

## 2018-10-25 MED ORDER — AMITRIPTYLINE HCL 25 MG PO TABS
50.0000 mg | ORAL_TABLET | Freq: Every day | ORAL | Status: DC
  Administered 2018-10-25 – 2018-10-26 (×3): 50 mg via ORAL
  Filled 2018-10-25 (×3): qty 2

## 2018-10-25 MED ORDER — LORATADINE 10 MG PO TABS: 10.0000 mg | ORAL_TABLET | Freq: Every day | ORAL | Status: DC | PRN

## 2018-10-25 MED ORDER — PRO-STAT SUGAR FREE PO LIQD
30.0000 mL | Freq: Two times a day (BID) | ORAL | Status: DC
  Administered 2018-10-26 – 2018-11-03 (×18): 30 mL via ORAL
  Filled 2018-10-25 (×20): qty 30

## 2018-10-25 MED ORDER — HEPARIN SODIUM (PORCINE) 5000 UNIT/ML IJ SOLN
5000.0000 [IU] | Freq: Two times a day (BID) | INTRAMUSCULAR | Status: DC
  Administered 2018-10-25 – 2018-11-04 (×22): 5000 [IU] via SUBCUTANEOUS
  Filled 2018-10-25 (×23): qty 1

## 2018-10-25 MED ORDER — SODIUM CHLORIDE 0.9 % IV SOLN
INTRAVENOUS | Status: DC
  Administered 2018-10-25 – 2018-10-26 (×3): via INTRAVENOUS

## 2018-10-25 MED ORDER — POTASSIUM CHLORIDE CRYS ER 20 MEQ PO TBCR
20.0000 meq | EXTENDED_RELEASE_TABLET | Freq: Every day | ORAL | Status: DC
  Administered 2018-10-25 – 2018-10-27 (×3): 20 meq via ORAL
  Filled 2018-10-25 (×3): qty 1

## 2018-10-25 MED ORDER — SODIUM CHLORIDE 0.9 % IV SOLN
1.0000 g | INTRAVENOUS | Status: DC
  Administered 2018-10-25 – 2018-10-30 (×6): 1 g via INTRAVENOUS
  Filled 2018-10-25 (×6): qty 1

## 2018-10-25 MED ORDER — ASPIRIN EC 81 MG PO TBEC
81.0000 mg | DELAYED_RELEASE_TABLET | Freq: Every day | ORAL | Status: DC
  Administered 2018-10-25 – 2018-11-04 (×11): 81 mg via ORAL
  Filled 2018-10-25 (×12): qty 1

## 2018-10-25 MED ORDER — METHYLPREDNISOLONE SODIUM SUCC 40 MG IJ SOLR
40.0000 mg | Freq: Two times a day (BID) | INTRAMUSCULAR | Status: DC
  Administered 2018-10-25 – 2018-10-26 (×4): 40 mg via INTRAVENOUS
  Filled 2018-10-25 (×4): qty 1

## 2018-10-25 MED ORDER — DILTIAZEM HCL 30 MG PO TABS
30.0000 mg | ORAL_TABLET | Freq: Two times a day (BID) | ORAL | Status: DC
  Administered 2018-10-25 – 2018-11-04 (×20): 30 mg via ORAL
  Filled 2018-10-25 (×21): qty 1

## 2018-10-25 MED ORDER — ACETAMINOPHEN 650 MG RE SUPP: 650.0000 mg | Freq: Four times a day (QID) | RECTAL | Status: DC | PRN

## 2018-10-25 MED ORDER — ACETAMINOPHEN 325 MG PO TABS
650.0000 mg | ORAL_TABLET | Freq: Four times a day (QID) | ORAL | Status: DC | PRN
  Administered 2018-10-30 – 2018-10-31 (×2): 650 mg via ORAL
  Filled 2018-10-25 (×3): qty 2

## 2018-10-25 MED ORDER — POLYETHYLENE GLYCOL 3350 17 G PO PACK: 17.0000 g | PACK | Freq: Every day | ORAL | Status: DC | PRN

## 2018-10-25 MED ORDER — PHENOL 1.4 % MT LIQD
1.0000 | Freq: Three times a day (TID) | OROMUCOSAL | Status: DC | PRN
  Administered 2018-10-25: 1 via OROMUCOSAL
  Filled 2018-10-25: qty 177

## 2018-10-25 NOTE — Progress Notes (Signed)
Initial Nutrition Assessment  INTERVENTION:   -Continue Prostat liquid protein PO 30 ml BID with meals, each supplement provides 100 kcal, 15 grams protein. -Provide Magic cup BID with meals, each supplement provides 290 kcal and 9 grams of protein -Measure new weight for admission (suspect error with current weight)  NUTRITION DIAGNOSIS:   Inadequate oral intake related to lethargy/confusion(AMS) as evidenced by per patient/family report, NPO status.  GOAL:   Patient will meet greater than or equal to 90% of their needs  MONITOR:   Diet advancement, Labs, Weight trends, I & O's  REASON FOR ASSESSMENT:   Malnutrition Screening Tool    ASSESSMENT:   84 y.o. female with medical history significant of coronary artery disease, hypertension, dyslipidemia, recent hospitalization for decompensated diastolic congestive heart failure who was brought to the ED due to worsening confusion, diffuse rash all over the body.    Patient in room, daughter did not want patient disturbed at this time. Pt with AMS. Per chart review, pt had reported pain and difficulty swallowing. SLP evaluated this morning, recommended full liquid diet.  Patient was seen by nutrition team in previous admission 10/29, pt had poor appetite at that time d/t taste changes. Pt does not like Ensure supplements. Will continue Prostat supplements and will order Magic Cups on meal trays. Pt had some weight loss but patient was being diuresed during that time as well.   Per weight records, pt weighs 59 lb this admission. Suspect this is an error. Order for new weight to be measured.  Labs reviewed. Medications: K-DUR tablet daily  NUTRITION - FOCUSED PHYSICAL EXAM:  Pt's daughter does not want patient disturbed.  Diet Order:   Diet Order            Diet full liquid Room service appropriate? No; Fluid consistency: Thin  Diet effective now              EDUCATION NEEDS:   Not appropriate for education at this  time  Skin:  Skin Assessment: Reviewed RN Assessment  Last BM:  11/13  Height:   Ht Readings from Last 1 Encounters:  10/25/18 5\' 1"  (1.549 m)    Weight:   Wt Readings from Last 1 Encounters:  10/25/18 26.9 kg    Ideal Body Weight:  47.7 kg  BMI:  Body mass index is 11.22 kg/m.  Estimated Nutritional Needs:   Kcal:  1550-1750  Protein:  65-75g  Fluid:  1.5L/day  Clayton Bibles, MS, RD, LDN [place] [facility] Pager: [phone] After Hours Pager: [phone]

## 2018-10-25 NOTE — Progress Notes (Signed)
PROGRESS NOTE                                                                                                                                                                                                             Patient Demographics:    Rebecca Ford, is a 84 y.o. female, DOB - 06/18/1930, QIW:[id number]  Admit date - 10/24/2018   Admitting Physician [name], MD  Outpatient Primary MD for the patient is [name], MD  LOS - 0  Outpatient Specialists : none  Chief Complaint  Patient presents with  . Allergic Reaction       Brief Narrative   84 year old female with coronary artery disease, hypertension, dyslipidemia, recent hospitalization for decompensated diastolic CHF, failure to thrive was discharged to SNF brought to the ED with increasing confusion, diffuse macular rash.  Per daughter who provided history she does have at least moderate dementia which was not diagnosed until her recent hospitalization.  She has been more forgetful in the recent past.  She was discharged to SNF on Megace recently.  For past 2 days she has noticed diffuse rash in her body with severe itching.  Also complaining of some difficulty swallowing.  No fevers, chills, nausea, vomiting, abdominal pain, shortness of breath, dysuria or diarrhea.  Patient is incontinent and wears diapers. In the ED blood work showed acute kidney injury and leukocytosis.  Admitted for further management.   Subjective:   Patient confused.  Restless and itching her body.  Per daughter it is less intense compared to yesterday.  Patient can identify her daughter and knows where she lives.   Assessment  & Plan :   Diffuse macular skin rash Suspect drug reaction.  Was recently started on Cardizem and Megace.  Could be contributed by medications.  Discontinue date and placed on IV Solu-Medrol, PRN IV Benadryl for itching.  No clinical signs of infection  except for possible UTI.  Ordered blood culture.  Active problems Acute metabolic encephalopathy Possibly worsening of her dementia in the setting of infection (UTI) and dehydration.  Avoid narcotics and benzos.  Low-dose PRN Haldol for agitation.  Head CT negative for acute findings.  Chest x-ray negative for infiltrate.  Acute kidney injury Suspect prerenal with dehydration.  Possible UTI as well.  Monitor with gentle hydration.  Avoid nephrotoxins.  Lasix, HCTZ  and Micardis held.  Currently hypovolemic.   UTI Placed on empiric Rocephin.  Check urine culture  Leukocytosis Reactive versus secondary to UTI.  Monitor.  Essential hypertension On Cardizem and metoprolol.  Chronic diastolic CHF Recent hospitalization for decompensation 2 weeks back with right pleural effusion.  Iron deficiency anemia Received IV iron during last hospitalization.  Severe protein calorie malnutrition On supplement.  Megace held.    Code Status : DNR  Family Communication  : Daughter at bedside  Disposition Plan  : Pending hospital course  Barriers For Discharge : Active symptoms  Consults  : None  Procedures  : Head CT  DVT Prophylaxis  : Subcu heparin  Lab Results  Component Value Date   PLT 252 10/25/2018    Antibiotics  :   Anti-infectives (From admission, onward)   Start     Dose/Rate Route Frequency Ordered Stop   10/25/18 0800  cefTRIAXone (ROCEPHIN) 1 g in sodium chloride 0.9 % 100 mL IVPB     1 g 200 mL/hr over 30 Minutes Intravenous Every 24 hours 10/25/18 0712          Objective:   Vitals:   10/25/18 0030 10/25/18 0100 10/25/18 0136 10/25/18 0540  BP: 128/65 (!) 113/100 (!) 146/62 134/72  Pulse: (!) 106 (!) 109 (!) 108 92  Resp: (!) 25 18 20 20   Temp:   99.5 F (37.5 C) 98.4 F (36.9 C)  TempSrc:   Oral Oral  SpO2: 91% 97% 100% 98%  Weight:   26.9 kg   Height:   5\' 1"  (1.549 m)     Wt Readings from Last 3 Encounters:  10/25/18 26.9 kg  10/09/18 61.4 kg    05/03/17 64.1 kg     Intake/Output Summary (Last 24 hours) at 10/25/2018 1513 Last data filed at 10/25/2018 1400 Gross per 24 hour  Intake 1121.69 ml  Output 500 ml  Net 621.69 ml     Physical Exam  Gen: Elderly female appears fatigued and restless HEENT: no pallor, dry mucosa, no oral thrush noted, macular rash on the face Chest: clear b/l, no added sounds CVS: N S1&S2, no murmurs, GI: soft, NT, ND, BS+ Musculoskeletal: Diffuse macular rash with some warmth nontender CNS: AAOX 1, oriented to person    Data Review:    CBC Recent Labs  Lab 10/24/18 2256 10/25/18 0516  WBC 20.5* 16.4*  HGB 11.8* 10.8*  HCT 37.1 33.5*  PLT 282 252  MCV 103.1* 100.6*  MCH 32.8 32.4  MCHC 31.8 32.2  RDW 13.5 13.4  LYMPHSABS  --  0.8  MONOABS  --  0.1  EOSABS  --  0.6*  BASOSABS  --  0.0    Chemistries  Recent Labs  Lab 10/24/18 2256 10/25/18 0009 10/25/18 0516  NA 140  --  142  K 3.6  --  3.8  CL 109  --  113*  CO2 20*  --  19*  GLUCOSE 117*  --  130*  BUN 75*  --  70*  CREATININE 1.68* 1.70* 1.54*  CALCIUM 9.5  --  9.1  AST 39  --  34  ALT 30  --  29  ALKPHOS 148*  --  138*  BILITOT 0.7  --  0.6   ------------------------------------------------------------------------------------------------------------------ No results for input(s): CHOL, HDL, LDLCALC, TRIG, CHOLHDL, LDLDIRECT in the last 72 hours.  No results found for: HGBA1C ------------------------------------------------------------------------------------------------------------------ No results for input(s): TSH, T4TOTAL, T3FREE, THYROIDAB in the last 72 hours.  Invalid input(s): FREET3 ------------------------------------------------------------------------------------------------------------------  No results for input(s): VITAMINB12, FOLATE, FERRITIN, TIBC, IRON, RETICCTPCT in the last 72 hours.  Coagulation profile No results for input(s): INR, PROTIME in the last 168 hours.  No results for  input(s): DDIMER in the last 72 hours.  Cardiac Enzymes No results for input(s): CKMB, TROPONINI, MYOGLOBIN in the last 168 hours.  Invalid input(s): CK ------------------------------------------------------------------------------------------------------------------    Component Value Date/Time   BNP 109.8 (H) 10/01/2018 1700    Inpatient Medications  Scheduled Meds: . amitriptyline  50 mg Oral QHS  . aspirin EC  81 mg Oral Daily  . atorvastatin  40 mg Oral QHS  . diltiazem  30 mg Oral Q12H  . feeding supplement (PRO-STAT SUGAR FREE 64)  30 mL Oral BID  . heparin  5,000 Units Subcutaneous Q12H  . methylPREDNISolone (SOLU-MEDROL) injection  40 mg Intravenous Q12H  . metoprolol tartrate  50 mg Oral BID  . pantoprazole  40 mg Oral BID  . potassium chloride SA  20 mEq Oral Daily   Continuous Infusions: . sodium chloride 100 mL/hr at 10/25/18 1350  . cefTRIAXone (ROCEPHIN)  IV 1 g (10/25/18 1051)   PRN Meds:.acetaminophen **OR** acetaminophen, diphenhydrAMINE, haloperidol lactate, loratadine, LORazepam, magic mouthwash w/lidocaine, nitroGLYCERIN, phenol, polyethylene glycol  Micro Results Recent Results (from the past 240 hour(s))  MRSA PCR Screening     Status: None   Collection Time: 10/25/18  2:18 AM  Result Value Ref Range Status   MRSA by PCR NEGATIVE NEGATIVE Final    Comment:        The GeneXpert MRSA Assay (FDA approved for NASAL specimens only), is one component of a comprehensive MRSA colonization surveillance program. It is not intended to diagnose MRSA infection nor to guide or monitor treatment for MRSA infections. Performed at [facility], [place] [address] [place], [place] [address]     Radiology Reports Dg Chest 2 View  Result Date: 10/24/2018 CLINICAL DATA:  84 y/o  F; progressive rash all over the body. EXAM: CHEST - 2 VIEW COMPARISON:  10/01/2018 chest radiograph. 10/04/2018 esophagram. FINDINGS: Stable cardiac silhouette given  projection and technique. Stable small right-sided pleural effusion and basilar opacity. No new consolidation. No pneumothorax. Stable hiatal hernia. No acute osseous abnormality is evident. IMPRESSION: Stable small right-sided pleural effusion and basilar opacity. Stable hiatal hernia. No new consolidation. Electronically Signed   By: Kristine Garbe M.D.   On: 10/24/2018 22:01   Dg Chest 2 View  Result Date: 10/01/2018 CLINICAL DATA:  84 y/o F; 4-5 days of weakness. Shortness of breath, cough, rales. EXAM: CHEST - 2 VIEW COMPARISON:  10/31/2016 chest radiograph. FINDINGS: Stable mildly enlarged cardiac silhouette given projection and technique. Small right pleural effusion. Right lung base opacification. Left lung base platelike atelectasis. No pneumothorax. Multilevel degenerative changes of the spine. No acute osseous abnormality is evident. IMPRESSION: Small right pleural effusion and basilar opacity which may represent associated atelectasis or pneumonia. Electronically Signed   By: Kristine Garbe M.D.   On: 10/01/2018 19:25   Ct Head Wo Contrast  Result Date: 10/24/2018 CLINICAL DATA:  Acute onset of altered mental status. Confusion and hallucinations. Found down. EXAM: CT HEAD WITHOUT CONTRAST TECHNIQUE: Contiguous axial images were obtained from the base of the skull through the vertex without intravenous contrast. COMPARISON:  None. FINDINGS: Brain: No evidence of acute infarction, hemorrhage, hydrocephalus, extra-axial collection or mass lesion / mass effect. Evaluation is somewhat suboptimal due to motion artifact at the vertex. Prominence of the ventricles and sulci reflects  mild cortical volume loss. Scattered periventricular and subcortical white matter change likely reflects small vessel ischemic microangiopathy. The brainstem and fourth ventricle are within normal limits. The basal ganglia are unremarkable in appearance. The cerebral hemispheres demonstrate grossly  normal gray-white differentiation. No mass effect or midline shift is seen. Vascular: No hyperdense vessel or unexpected calcification. Skull: There is no evidence of fracture; visualized osseous structures are unremarkable in appearance. Sinuses/Orbits: The orbits are within normal limits. The paranasal sinuses and mastoid air cells are well-aerated. Other: No significant soft tissue abnormalities are seen. IMPRESSION: 1. No acute intracranial pathology seen on CT. 2. Mild cortical volume loss and scattered small vessel ischemic microangiopathy. Electronically Signed   By: Garald Balding M.D.   On: 10/24/2018 22:24   Ct Angio Chest Pe W Or Wo Contrast  Result Date: 10/02/2018 CLINICAL DATA:  84 year old with bilateral leg edema and fatigue. Elevated D-dimer. EXAM: CT ANGIOGRAPHY CHEST WITH CONTRAST TECHNIQUE: Multidetector CT imaging of the chest was performed using the standard protocol during bolus administration of intravenous contrast. Multiplanar CT image reconstructions and MIPs were obtained to evaluate the vascular anatomy. CONTRAST:  131mL ISOVUE-370 IOPAMIDOL (ISOVUE-370) INJECTION 76% COMPARISON:  Chest radiograph earlier this day. FINDINGS: Cardiovascular: There are no filling defects within the pulmonary arteries to suggest pulmonary embolus. Aortic atherosclerosis and tortuosity without dissection. Conventional branching pattern from the aortic arch. Mild multi chamber cardiomegaly. There are coronary artery calcifications. No significant pericardial effusion. Mediastinum/Nodes: Multiple small epicardial nodes, nonspecific. No enlarged mediastinal or hilar lymph nodes. Small to moderate hiatal hernia. Upper esophagus is patulous. 3 cm right thyroid or parathyroid nodule. Enlargement of the right thyroid lobe compared to left. Lungs/Pleura: Moderate right pleural effusion is partially loculated. Associated compressive atelectasis in the right lower and middle lobes. There is a small left pleural  effusion with adjacent atelectasis. Vague ground-glass opacities with minimal septal thickening at the apices. No suspicious pulmonary mass. Upper Abdomen: Upper abdominal ascites adjacent to the liver and spleen. Musculoskeletal: Lucency in the upper sternal body with surrounding calcification, suggesting subacute fracture. Degenerative change in the spine. Review of the MIP images confirms the above findings. IMPRESSION: 1. No pulmonary embolus. 2. Moderate right pleural effusion, partially loculated. There is adjacent compressive atelectasis. Small left pleural effusion. 3. Minimal ground-glass opacities and septal thickening at the apices, possible early pulmonary edema. Multi chamber cardiomegaly. 4. Ascites in the upper abdomen. 5. Sternal lucency with surrounding calcifications, likely subacute or remote fracture. Focal lytic bone lesion is not excluded on imaging findings alone, but felt less likely particularly in the absence of known malignancy. 6. A 3 cm hypodense nodule abutting the right lobe of the thyroid gland may be an exophytic thyroid nodule or parathyroid nodule. Thyroid ultrasound may be of value, giving consideration for patient's advanced age. 7. Aortic Atherosclerosis (ICD10-I70.0). Coronary artery calcifications. Electronically Signed   By: Keith Rake M.D.   On: 10/02/2018 05:51   Dg Esophagus  Result Date: 10/04/2018 CLINICAL DATA:  Difficulty swallowing. EXAM: ESOPHOGRAM/BARIUM SWALLOW TECHNIQUE: Single contrast examination was performed using thin barium. A barium tablet was also administered FLUOROSCOPY TIME:  Fluoroscopy Time:  2.9 minutes Radiation Exposure Index (if provided by the fluoroscopic device): 55.9 mGy Number of Acquired Spot Images: 3 COMPARISON:  None. FINDINGS: The proximal and mid esophagus are widely patent. No stricture or mass. The motility of the esophagus is abnormal with multiple disorganized tertiary waves. A large hiatal hernia is identified. This  contains approximately 20% intrathoracic stomach. Transient narrowing  of the distal esophagus at the level of the GE junction noted which resulted and attenuation of tablet transit into the intrathoracic portions of the stomach. After several swallows of thin barium and water the tablet eventually passed into the stomach. Mild reflux identified. IMPRESSION: 1. Large hiatal hernia. 2. Esophageal dysmotility. Electronically Signed   By: Kerby Moors M.D.   On: 10/04/2018 15:31   US Abdomen Limited  Result Date: 10/02/2018 CLINICAL DATA:  Abdominal distension EXAM: LIMITED ABDOMEN ULTRASOUND FOR ASCITES TECHNIQUE: Limited ultrasound survey for ascites was performed in all four abdominal quadrants. COMPARISON:  10/02/2018 FINDINGS: Survey of the abdominal 4 quadrants demonstrates a small to moderate amount of diffuse ascites. Right pleural effusion also noted. IMPRESSION: Small to moderate abdominopelvic ascites Right pleural effusion Electronically Signed   By: Jerilynn Mages.  Shick M.D.   On: 10/02/2018 15:14    Time Spent in minutes  25   Johnson Arizola M.D on 10/25/2018 at 3:13 PM  Between 7am to 7pm - Pager - [phone]  After 7pm go to [email or web address] - password [facility]  [facility] -  Office  [phone]

## 2018-10-25 NOTE — ED Notes (Signed)
Patient taken upstairs by Tech.

## 2018-10-25 NOTE — Evaluation (Signed)
Clinical/Bedside Swallow Evaluation Patient Details  Name: Rebecca Ford MRN: [id number] Date of Birth: 1929-12-14  Today's Date: 10/25/2018 Time: SLP Start Time (ACUTE ONLY): 0940 SLP Stop Time (ACUTE ONLY): 1035 SLP Time Calculation (min) (ACUTE ONLY): 55 min  Past Medical History:  Past Medical History:  Diagnosis Date  . Arthritis   . CAD (coronary artery disease)    s/p cath 09/07/10 w/3 vessel CAD, DES in RCA, Diagonal & LAD  . Dementia ([place])   . HTN (hypertension)   . Hyperlipemia   . MI (myocardial infarction) ([place])    Non-ST elevation 9/11  . Primary osteoarthritis of right knee 09/2016   Past Surgical History:  Past Surgical History:  Procedure Laterality Date  . CARDIAC CATHETERIZATION     YRS AGO   . CATARACT EXTRACTION W/ INTRAOCULAR LENS  IMPLANT, BILATERAL    . PARTIAL HYSTERECTOMY    . RETINAL DETACHMENT SURGERY     LEFT  . TOTAL KNEE ARTHROPLASTY Right 11/13/2016   Procedure: TOTAL KNEE ARTHROPLASTY;  Surgeon: Frederik Pear, MD;  Location: [place];  Service: Orthopedics;  Laterality: Right;   HPI:  84 year old female admitted 10/24/18 with confusion, diffuse rash, dehydration xerostomia, difficulty swallowing. PMH: CAD, HTN, DLD, recent hospitalization for dCHF, MI. CT head = No acute intracranial pathology. CXR = Stable small right-sided pleural effusion and basilar opacity. Stable hiatal hernia. No new consolidation.   Assessment / Plan / Recommendation Clinical Impression  An increase in pt's choking at home has been noted per daughter. Oral care was completed with suction. Oral motor strength and function appear adequate, dentition is adequate. Pt accepted individual ice chips with appropriate oral prep and propulsion. No overt s/s aspiration observed. Pt spat out initial sips of water via straw, however, daughter was able to encourage pt to accept trials. Pt also tolerated trials of ice cream, but spat out applesauce. No oral residue and no overt s/s aspiration  observed on any tested consistency. Solid consistency not assessed at this time, due to altered mentation and suspected increased risk.   Will begin with full liquid/thin liquid diet and advance as pt demonstrates improvement and tolerance of advanced consistencies. Daughter was provided with behavioral and dietary strategies for management of esophageal dysmotility, and this information was posted at [facility] with safe swallow strategies and reviewed with daughter and RN. ST will continue to follow acutely.    SLP Visit Diagnosis: Dysphagia, unspecified (R13.10)    Aspiration Risk  Mild aspiration risk    Diet Recommendation Thin liquid(full liquid)   Liquid Administration via: Straw Medication Administration: Crushed with puree(or IV) Supervision: Full supervision/cueing for compensatory strategies;Staff to assist with self feeding Compensations: Slow rate;Small sips/bites;Follow solids with liquid;Other (Comment)(strategies for esophageal dysmotility) Postural Changes: Remain upright for at least 30 minutes after po intake;Seated upright at 90 degrees    Other  Recommendations Oral Care Recommendations: Oral care QID   Follow up Recommendations (TBD)      Frequency and Duration min 2x/week  2 weeks       Prognosis Prognosis for Safe Diet Advancement: Fair Barriers to Reach Goals: Cognitive deficits      Swallow Study   General Date of Onset: 10/24/18 HPI: 84 year old female admitted 10/24/18 with confusion, diffuse rash, dehydration xerostomia, difficulty swallowing. PMH: CAD, HTN, DLD, recent hospitalization for dCHF, MI. CT head = No acute intracranial pathology. CXR = Stable small right-sided pleural effusion and basilar opacity. Stable hiatal hernia. No new consolidation. Type of Study:  Bedside Swallow Evaluation   Previous Swallow Assessment: BSE 10/04/18 = suspect primary esophageal dysphagia. Esophageal precautions reviewed. Reg/thin recommended.  BaSw performed that date  as well = Large hiatal hernia, Esophageal dysmotility, disorganized tertiary waves  Diet Prior to this Study: NPO Temperature Spikes Noted: No Respiratory Status: Nasal cannula History of Recent Intubation: No Behavior/Cognition: Alert;Doesn't follow directions(intermittently uncooperative) Oral Cavity Assessment: Dry Oral Care Completed by SLP: Yes Oral Cavity - Dentition: Adequate natural dentition Self-Feeding Abilities: Total assist Patient Positioning: Upright in bed Baseline Vocal Quality: Normal Volitional Cough: Cognitively unable to elicit Volitional Swallow: Unable to elicit    Oral/Motor/Sensory Function Overall Oral Motor/Sensory Function: Within functional limits   Ice Chips Ice chips: Within functional limits   Thin Liquid Thin Liquid: Within functional limits Presentation: Straw Other Comments: Daughter reports pt does best with a straw when the cup is held for the pt. Pt spit out several boluses of water before swallowing any.    Nectar Thick Nectar Thick Liquid: Not tested   Honey Thick Honey Thick Liquid: Not tested   Puree Puree: Within functional limits Other Comments: Pt accepted ice cream, but spit out applesauce   Solid     Solid: Not tested     Akane Tessier B. Quentin Ore, [facility], CCC-SLP Speech Language Pathologist [phone]  Shonna Chock 10/25/2018,10:50 AM

## 2018-10-25 NOTE — Progress Notes (Signed)
No new order received for IV Ativan. Benadryl 12.5 mg IV administered for itching with a little relief. Will continue to monitor.

## 2018-10-25 NOTE — Plan of Care (Signed)
  Problem: Clinical Measurements: Goal: Diagnostic test results will improve Outcome: Progressing Goal: Respiratory complications will improve Outcome: Progressing   Problem: Safety: Goal: Ability to remain free from injury will improve Outcome: Progressing   Problem: Skin Integrity: Goal: Risk for impaired skin integrity will decrease Outcome: Progressing   

## 2018-10-25 NOTE — Progress Notes (Signed)
PT Cancellation Note  Patient Details Name: SERGIO HOBART MRN: [id number] DOB: 06-23-1930   Cancelled Treatment:    Reason Eval/Treat Not Completed: Other (comment); noted order for PT to start tomorrow (11/16) and pt/daughter refused PT this am.  Will attempt to see another day.   Reginia Naas 10/25/2018, 11:50 AM Magda Kiel, PT Acute Rehabilitation Services [phone] 10/25/2018

## 2018-10-25 NOTE — Progress Notes (Addendum)
Patient admitted to [facility] with the diagnosis of AFR. Patient is A & O x 4 but forgetful. Patient noted to be coughing and gurgling when given water to drink her oral Medicaines. Patient kept NPO per protocol pending speech evaluation. Generalized rash and redness on her body. Patient is anxious/agitated , restless and pulling leads as well as oxygen tubing. Paged Schorr NP  to see if would change oral ativan  to IV. No acute distress noted or any complain of pain. Will continue to monitor.

## 2018-10-25 NOTE — ED Notes (Signed)
ED TO INPATIENT HANDOFF REPORT  Name/Age/Gender Rebecca Ford 84 y.o. female  Code Status    Code Status Orders  (From admission, onward)         Start     Ordered   10/25/18 0010  Full code  Continuous     10/25/18 0019        Code Status History    Date Active Date Inactive Code Status Order ID Comments User Context   10/01/2018 2234 10/09/2018 1604 Full Code [id number]  [name], MD ED   11/13/2016 1542 11/16/2016 1643 Full Code [id number]  Leighton Parody, PA-C Inpatient      Home/SNF/Other Nursing Home  Chief Complaint Allergic Reaction  Level of Care/Admitting Diagnosis ED Disposition    ED Disposition Condition [facility] Area: [facility] [[id number]]  Level of Care: Telemetry [5]  Admit to tele based on following criteria: Other see comments  Comments: tachycardia  Diagnosis: Acute renal failure (ARF) [facility]) [[id number]]  Admitting Physician: Yaakov Guthrie [[id number]]  Attending Physician: Yaakov Guthrie [[id number]]  Estimated length of stay: 3 - 4 days  Certification:: I certify this patient will need inpatient services for at least 2 midnights  PT Class (Do Not Modify): Inpatient [101]  PT Acc Code (Do Not Modify): Private [1]       Medical History Past Medical History:  Diagnosis Date  . Arthritis   . CAD (coronary artery disease)    s/p cath 09/07/10 w/3 vessel CAD, DES in RCA, Diagonal & LAD  . Dementia ([place])   . HTN (hypertension)   . Hyperlipemia   . MI (myocardial infarction) ([place])    Non-ST elevation 9/11  . Primary osteoarthritis of right knee 09/2016    Allergies Allergies  Allergen Reactions  . Codeine Other (See Comments)    Makes her severely depressed     IV Location/Drains/Wounds Patient Lines/Drains/Airways Status   Active Line/Drains/Airways    Name:   Placement date:   Placement time:   Site:   Days:   Peripheral IV 10/24/18 Left Antecubital   10/24/18    -    Antecubital   1    Peripheral IV 10/24/18 Right Antecubital   10/24/18    2302    Antecubital   1   External Urinary Catheter   10/24/18    2329    -   1          Labs/Imaging Results for orders placed or performed during the hospital encounter of 10/24/18 (from the past 48 hour(s))  CBC     Status: Abnormal   Collection Time: 10/24/18 10:56 PM  Result Value Ref Range   WBC 20.5 (H) 4.0 - 10.5 K/uL   RBC 3.60 (L) 3.87 - 5.11 MIL/uL   Hemoglobin 11.8 (L) 12.0 - 15.0 g/dL   HCT 37.1 36.0 - 46.0 %   MCV 103.1 (H) 80.0 - 100.0 fL   MCH 32.8 26.0 - 34.0 pg   MCHC 31.8 30.0 - 36.0 g/dL   RDW 13.5 11.5 - 15.5 %   Platelets 282 150 - 400 K/uL   nRBC 0.0 0.0 - 0.2 %    Comment: Performed at [facility], [place] [address], [place], [place] [address]  Comprehensive metabolic panel     Status: Abnormal   Collection Time: 10/24/18 10:56 PM  Result Value Ref Range   Sodium 140 135 - 145 mmol/L   Potassium 3.6 3.5 - 5.1  mmol/L   Chloride 109 98 - 111 mmol/L   CO2 20 (L) 22 - 32 mmol/L   Glucose, Bld 117 (H) 70 - 99 mg/dL   BUN 75 (H) 8 - 23 mg/dL   Creatinine, Ser 1.68 (H) 0.44 - 1.00 mg/dL   Calcium 9.5 8.9 - 10.3 mg/dL   Total Protein 7.0 6.5 - 8.1 g/dL   Albumin 2.6 (L) 3.5 - 5.0 g/dL   AST 39 15 - 41 U/L   ALT 30 0 - 44 U/L   Alkaline Phosphatase 148 (H) 38 - 126 U/L   Total Bilirubin 0.7 0.3 - 1.2 mg/dL   GFR calc non Af Amer 26 (L) >60 mL/min   GFR calc Af Amer 31 (L) >60 mL/min    Comment: (NOTE) The eGFR has been calculated using the CKD EPI equation. This calculation has not been validated in all clinical situations. eGFR's persistently <60 mL/min signify possible Chronic Kidney Disease.    Anion gap 11 5 - 15    Comment: Performed at [facility], [place] [address] [place], [place] [address]   Dg Chest 2 View  Result Date: 10/24/2018 CLINICAL DATA:  84 y/o  F; progressive rash all over the body. EXAM: CHEST - 2 VIEW COMPARISON:  10/01/2018 chest  radiograph. 10/04/2018 esophagram. FINDINGS: Stable cardiac silhouette given projection and technique. Stable small right-sided pleural effusion and basilar opacity. No new consolidation. No pneumothorax. Stable hiatal hernia. No acute osseous abnormality is evident. IMPRESSION: Stable small right-sided pleural effusion and basilar opacity. Stable hiatal hernia. No new consolidation. Electronically Signed   By: Kristine Garbe M.D.   On: 10/24/2018 22:01   Ct Head Wo Contrast  Result Date: 10/24/2018 CLINICAL DATA:  Acute onset of altered mental status. Confusion and hallucinations. Found down. EXAM: CT HEAD WITHOUT CONTRAST TECHNIQUE: Contiguous axial images were obtained from the base of the skull through the vertex without intravenous contrast. COMPARISON:  None. FINDINGS: Brain: No evidence of acute infarction, hemorrhage, hydrocephalus, extra-axial collection or mass lesion / mass effect. Evaluation is somewhat suboptimal due to motion artifact at the vertex. Prominence of the ventricles and sulci reflects mild cortical volume loss. Scattered periventricular and subcortical white matter change likely reflects small vessel ischemic microangiopathy. The brainstem and fourth ventricle are within normal limits. The basal ganglia are unremarkable in appearance. The cerebral hemispheres demonstrate grossly normal gray-white differentiation. No mass effect or midline shift is seen. Vascular: No hyperdense vessel or unexpected calcification. Skull: There is no evidence of fracture; visualized osseous structures are unremarkable in appearance. Sinuses/Orbits: The orbits are within normal limits. The paranasal sinuses and mastoid air cells are well-aerated. Other: No significant soft tissue abnormalities are seen. IMPRESSION: 1. No acute intracranial pathology seen on CT. 2. Mild cortical volume loss and scattered small vessel ischemic microangiopathy. Electronically Signed   By: Garald Balding M.D.   On:  10/24/2018 22:24   EKG Interpretation  Date/Time:  Friday October 25 2018 00:13:28 EST Ventricular Rate:  106 PR Interval:    QRS Duration: 100 QT Interval:  344 QTC Calculation: 457 R Axis:   -64 Text Interpretation:  Sinus tachycardia Left anterior fascicular block Low voltage, precordial leads Baseline wander in lead(s) V5 Since last tracing rate faster Confirmed by Dorie Rank [phone]) on 10/25/2018 12:22:09 AM   Pending Labs Unresulted Labs (From admission, onward)    Start     Ordered   10/25/18 0500  CBC with Differential/Platelet  Tomorrow morning,   R  10/25/18 0019   10/25/18 0500  Comprehensive metabolic panel  Tomorrow morning,   R     10/25/18 0019   10/25/18 0009  CBC  (heparin)  Once,   R    Comments:  Baseline for heparin therapy IF NOT ALREADY DRAWN.  Notify MD if PLT < 100 K.    10/25/18 0019   10/25/18 0009  Creatinine, serum  (heparin)  Once,   R    Comments:  Baseline for heparin therapy IF NOT ALREADY DRAWN.    10/25/18 0019   10/24/18 2332  Urine Culture  Once,   STAT     10/24/18 2331   10/24/18 2106  Urinalysis, Routine w reflex microscopic  Once,   R     10/24/18 2105          Vitals/Pain Today's Vitals   10/24/18 2000 10/24/18 2006 10/24/18 2321 10/24/18 2322  BP: 99/83  (!) 126/114   Pulse: (!) 102  (!) 110 (!) 110  Resp: '16  20 20  ' Temp: 98.3 F (36.8 C)     TempSrc: Oral     SpO2: 97%  98% 100%  Weight:  61.4 kg    Height:  '5\' 2"'  (1.575 m)    PainSc:  0-No pain      Isolation Precautions No active isolations  Medications Medications  0.9 %  sodium chloride infusion ( Intravenous New Bag/Given 10/24/18 2220)  sodium chloride 0.9 % bolus 500 mL (500 mLs Intravenous Bolus from Bag 10/25/18 0014)    Followed by  0.9 %  sodium chloride infusion (0 mLs Intravenous Hold 10/25/18 0014)  heparin injection 5,000 Units (has no administration in time range)  acetaminophen (TYLENOL) tablet 650 mg (has no administration in time range)     Or  acetaminophen (TYLENOL) suppository 650 mg (has no administration in time range)  0.9 %  sodium chloride infusion (has no administration in time range)  diphenhydrAMINE (BENADRYL) injection 12.5 mg (has no administration in time range)  predniSONE (DELTASONE) tablet 40 mg (40 mg Oral Given 10/24/18 2129)    Mobility non-ambulatory

## 2018-10-25 NOTE — H&P (Signed)
History and Physical    Rebecca Ford:[id number] DOB: 1930/04/04 DOA: 10/24/2018  PCP: [name], MD   Patient coming from: No Name have personally briefly reviewed patient's old medical records in [facility]  Chief Complaint: Diffuse rash, dehydration  HPI: Rebecca Ford is a 84 y.o. female with medical history significant of coronary artery disease, hypertension, dyslipidemia, recent hospitalization for decompensated diastolic congestive heart failure who was brought to the ED due to worsening confusion, diffuse rash all over the body.  Patient right now is oriented x2 but unable to provide any history and has been forgetful per the daughter at the bedside.  Daughter says that patient has been more forgetful and recently has been more confused compared to her baseline.  According to the daughter the patient has been having diffuse rash since the past 2 days with worsening along with severe itching.  Patient reportedly also complaining that her mouth is dry and some difficulty swallowing for the past couple of days.  There is no mention of any fever, chills, shortness of breath, chest pain, nausea, vomiting, abdominal pain, diarrhea or dysuria or any other complaints. Patient has urinary incontinence and wears diapers.  ED Course: In the emergency room patient was found to have diffuse rash all over the body but no oral ulcers, she has a dry mouth.  Lab work revealed leukocytosis, acute renal failure with elevated BUN/creatinine compared to her baseline.  She was mildly tachycardic.  Review of Systems: Unable to obtain review of systems from the patient but obtained from the daughter and is negative except as mentioned above in the HPI.    Past Medical History:  Diagnosis Date  . Arthritis   . CAD (coronary artery disease)    s/p cath 09/07/10 w/3 vessel CAD, DES in RCA, Diagonal & LAD  . Dementia ([place])   . HTN (hypertension)   . Hyperlipemia   . MI (myocardial  infarction) ([place])    Non-ST elevation 9/11  . Primary osteoarthritis of right knee 09/2016    Past Surgical History:  Procedure Laterality Date  . CARDIAC CATHETERIZATION     YRS AGO   . CATARACT EXTRACTION W/ INTRAOCULAR LENS  IMPLANT, BILATERAL    . PARTIAL HYSTERECTOMY    . RETINAL DETACHMENT SURGERY     LEFT  . TOTAL KNEE ARTHROPLASTY Right 11/13/2016   Procedure: TOTAL KNEE ARTHROPLASTY;  Surgeon: Frederik Pear, MD;  Location: [facility];  Service: Orthopedics;  Laterality: Right;     reports that she has never smoked. She has never used smokeless tobacco. She reports that she does not drink alcohol or use drugs.  Allergies  Allergen Reactions  . Codeine Other (See Comments)    Makes her severely depressed     Family History  Problem Relation Age of Onset  . Heart attack Mother   . Coronary artery disease Other        diagnosis of CAD but not premature diagnosis  . Coronary artery disease Brother     Prior to Admission medications   Medication Sig Start Date End Date Taking? Authorizing Provider  acetaminophen (TYLENOL) 325 MG tablet Take 650 mg by mouth every 6 (six) hours as needed for mild pain.   Yes [provider]  alendronate (FOSAMAX) 70 MG tablet Take 70 mg by mouth once a week. 08/31/18  Yes [provider]  Amino Acids-Protein Hydrolys (FEEDING SUPPLEMENT, PRO-STAT SUGAR FREE 64,) LIQD Take 30 mLs by mouth 2 (two) times  daily. 10/09/18 11/08/18 Yes Arrien, Jimmy Picket, MD  amitriptyline (ELAVIL) 100 MG tablet Take 100 mg by mouth at bedtime. 08/09/16  Yes [provider]  aspirin EC 81 MG tablet Take 1 tablet (81 mg total) by mouth daily. 05/03/17  Yes Burnell Blanks, MD  atorvastatin (LIPITOR) 80 MG tablet Take 80 mg by mouth at bedtime.    Yes [provider]  cetirizine (ZYRTEC) 10 MG tablet Take 10 mg by mouth daily.   Yes [provider]  Cholecalciferol (VITAMIN D-3 PO) Take 1 tablet by mouth every morning.     Yes [provider]  diltiazem (CARDIZEM) 30 MG tablet Take 1 tablet (30 mg total) by mouth every 12 (twelve) hours. 10/09/18 11/08/18 Yes Arrien, Jimmy Picket, MD  Ferrous Gluconate (IRON 27 PO) Take 1 tablet by mouth daily.   Yes [provider]  fish oil-omega-3 fatty acids 1000 MG capsule Take 1 g by mouth 2 (two) times daily.     Yes [provider]  furosemide (LASIX) 20 MG tablet Take 20 mg by mouth daily.    Yes [provider]  hydrochlorothiazide (HYDRODIURIL) 25 MG tablet Take 25 mg by mouth every morning. 08/28/18  Yes [provider]  hydrOXYzine (ATARAX/VISTARIL) 25 MG tablet Take 25 mg by mouth every 8 (eight) hours as needed for itching.   Yes [provider]  metoprolol (LOPRESSOR) 50 MG tablet Take 50 mg by mouth 2 (two) times daily.  03/28/14  Yes [provider]  Multiple Vitamins-Minerals (PRESERVISION AREDS PO) Take 1 tablet by mouth 2 (two) times daily.    Yes [provider]  nitroGLYCERIN (NITROSTAT) 0.4 MG SL tablet Place 1 tablet (0.4 mg total) under the tongue every 5 (five) minutes as needed for chest pain. 06/04/14  Yes Burnell Blanks, MD  pantoprazole (PROTONIX) 40 MG tablet Take 1 tablet (40 mg total) by mouth 2 (two) times daily. 10/09/18 11/08/18 Yes Arrien, Jimmy Picket, MD  polyethylene glycol [facility] / Floria Raveling) packet Take 17 g by mouth daily.   Yes [provider]  potassium chloride SA (K-DUR,KLOR-CON) 20 MEQ tablet Take 20 mEq by mouth daily. 11/01/16  Yes [provider]  telmisartan (MICARDIS) 40 MG tablet Take 40 mg by mouth every morning. 08/24/18  Yes [provider]  megestrol (MEGACE) 400 MG/10ML suspension Take 10 mLs (400 mg total) by mouth daily. Patient not taking: Reported on 10/24/2018 10/10/18 11/09/18  Arrien, Jimmy Picket, MD    Physical Exam: Vitals:   10/24/18 2006 10/24/18 2321 10/24/18 2322 10/25/18 0030  BP:  (!)  126/114  128/65  Pulse:  (!) 110 (!) 110 (!) 106  Resp:  20 20 (!) 25  Temp:      TempSrc:      SpO2:  98% 100% 91%  Weight: 61.4 kg     Height: 5\' 2"  (1.575 m)       Constitutional: NAD, calm, comfortable Vitals:   10/24/18 2006 10/24/18 2321 10/24/18 2322 10/25/18 0030  BP:  (!) 126/114  128/65  Pulse:  (!) 110 (!) 110 (!) 106  Resp:  20 20 (!) 25  Temp:      TempSrc:      SpO2:  98% 100% 91%  Weight: 61.4 kg     Height: 5\' 2"  (1.575 m)      Eyes: PERRL, lids and conjunctivae normal ENMT: Dry mucous membranes. Posterior pharynx clear of any exudate or lesions. Poor dentition.  Neck: normal, supple,  no masses, no thyromegaly Respiratory: clear to auscultation bilaterally, no wheezing, no crackles. Normal respiratory effort. No accessory muscle use.  Cardiovascular: Tachycardic, no murmurs / rubs / gallops. No extremity edema. 2+ pedal pulses. No carotid bruits.  Abdomen: no tenderness, no masses palpated. No hepatosplenomegaly. Bowel sounds positive.  Musculoskeletal: no clubbing / cyanosis. No joint deformity upper and lower extremities. Good ROM, no contractures. Normal muscle tone.  Skin: Diffuse rash all over the body, no open lesions noted at this time Neurologic: Patient oriented x2, sensation intact, DTR normal. Strength 5/5 in all 4.  Uses walker at baseline Psychiatric: Oriented x2  Labs on Admission: I have personally reviewed following labs and imaging studies  CBC: Recent Labs  Lab 10/24/18 2256  WBC 20.5*  HGB 11.8*  HCT 37.1  MCV 103.1*  PLT 557   Basic Metabolic Panel: Recent Labs  Lab 10/24/18 2256  NA 140  K 3.6  CL 109  CO2 20*  GLUCOSE 117*  BUN 75*  CREATININE 1.68*  CALCIUM 9.5   GFR: Estimated Creatinine Clearance: 20.7 mL/min (A) (by C-G formula based on SCr of 1.68 mg/dL (H)). Liver Function Tests: Recent Labs  Lab 10/24/18 2256  AST 39  ALT 30  ALKPHOS 148*  BILITOT 0.7  PROT 7.0  ALBUMIN 2.6*   No results for input(s):  LIPASE, AMYLASE in the last 168 hours. No results for input(s): AMMONIA in the last 168 hours. Coagulation Profile: No results for input(s): INR, PROTIME in the last 168 hours. Cardiac Enzymes: No results for input(s): CKTOTAL, CKMB, CKMBINDEX, TROPONINI in the last 168 hours. BNP (last 3 results) No results for input(s): PROBNP in the last 8760 hours. HbA1C: No results for input(s): HGBA1C in the last 72 hours. CBG: No results for input(s): GLUCAP in the last 168 hours. Lipid Profile: No results for input(s): CHOL, HDL, LDLCALC, TRIG, CHOLHDL, LDLDIRECT in the last 72 hours. Thyroid Function Tests: No results for input(s): TSH, T4TOTAL, FREET4, T3FREE, THYROIDAB in the last 72 hours. Anemia Panel: No results for input(s): VITAMINB12, FOLATE, FERRITIN, TIBC, IRON, RETICCTPCT in the last 72 hours. Urine analysis:    Component Value Date/Time   COLORURINE STRAW (A) 10/01/2018 2338   APPEARANCEUR CLEAR 10/01/2018 2338   LABSPEC 1.005 10/01/2018 2338   PHURINE 7.0 10/01/2018 [place] 10/01/2018 [place] 10/01/2018 [place] 10/01/2018 [place] 10/01/2018 [place] NEGATIVE 10/01/2018 2338   NITRITE NEGATIVE 10/01/2018 2338   LEUKOCYTESUR NEGATIVE 10/01/2018 2338    Radiological Exams on Admission: Dg Chest 2 View  Result Date: 10/24/2018 CLINICAL DATA:  84 y/o  F; progressive rash all over the body. EXAM: CHEST - 2 VIEW COMPARISON:  10/01/2018 chest radiograph. 10/04/2018 esophagram. FINDINGS: Stable cardiac silhouette given projection and technique. Stable small right-sided pleural effusion and basilar opacity. No new consolidation. No pneumothorax. Stable hiatal hernia. No acute osseous abnormality is evident. IMPRESSION: Stable small right-sided pleural effusion and basilar opacity. Stable hiatal hernia. No new consolidation. Electronically Signed   By: Kristine Garbe M.D.   On: 10/24/2018 22:01    Ct Head Wo Contrast  Result Date: 10/24/2018 CLINICAL DATA:  Acute onset of altered mental status. Confusion and hallucinations. Found down. EXAM: CT HEAD WITHOUT CONTRAST TECHNIQUE: Contiguous axial images were obtained from the base of the skull through the vertex without intravenous contrast. COMPARISON:  None. FINDINGS: Brain: No evidence of acute infarction, hemorrhage, hydrocephalus, extra-axial collection or mass  lesion / mass effect. Evaluation is somewhat suboptimal due to motion artifact at the vertex. Prominence of the ventricles and sulci reflects mild cortical volume loss. Scattered periventricular and subcortical white matter change likely reflects small vessel ischemic microangiopathy. The brainstem and fourth ventricle are within normal limits. The basal ganglia are unremarkable in appearance. The cerebral hemispheres demonstrate grossly normal gray-white differentiation. No mass effect or midline shift is seen. Vascular: No hyperdense vessel or unexpected calcification. Skull: There is no evidence of fracture; visualized osseous structures are unremarkable in appearance. Sinuses/Orbits: The orbits are within normal limits. The paranasal sinuses and mastoid air cells are well-aerated. Other: No significant soft tissue abnormalities are seen. IMPRESSION: 1. No acute intracranial pathology seen on CT. 2. Mild cortical volume loss and scattered small vessel ischemic microangiopathy. Electronically Signed   By: Garald Balding M.D.   On: 10/24/2018 22:24    Assessment/Plan Active Problems:   HYPERTENSION, BENIGN   CAD, NATIVE VESSEL   CHF (congestive heart failure) (HCC)   Acute renal failure (ARF) (HCC)   Rash and nonspecific skin eruption   Confusion  Skin rash: Patient has diffuse skin rash.  The only new medications that she has been started is the diltiazem and Megace.  At this time exact etiology for rash unclear but suspect may be secondary to the Megace.  She was getting Atarax  at the facility without much help with the itching.  We will start her on low-dose IV Benadryl as needed.  We will also start her on Solu-Medrol.  Monitor closely.  Currently denies having any ulcerations in the mouth.  Complaining of some pain and discomfort while swallowing but this has been a somewhat chronic problem.  Acute renal failure: Patient found to have elevated BUN/creatinine compared to her baseline.  Could be secondary to dehydration, decreased oral intake, overdiuresis.  Will hold Lasix, Micardis and hydrochlorothiazide.  We will start her on IV hydration.  Continue to monitor BUN/creatinine closely.  Will also monitor intake/output.  Leukocytosis: Likely reactive?  WBC count noted to be elevated but no fevers.  UA has been ordered and still pending at this time.  Reluctant to give any antibiotics given the diffuse rash unless she has a confirmed UTI.  Currently needing steroids due to the diffusion of the rash.  Continue to monitor WBC count, suspect may be further elevated secondary to effect from the steroids.  Congestive heart failure: Patient was recently admitted for decompensated diastolic heart failure.  At this time she does not have any lower extremity edema but appears to be dehydrated therefore no choice but to start her on fluids.  Will hold Lasix.  Hypertension: We will continue treatment with Cardizem, metoprolol.  Will hold Lasix, hydrochlorothiazide, myocarditis due to the acute renal failure.  On IV hydration due to acute renal failure, dehydration.  Continue to monitor blood pressure closely and adjust medications as needed.  Confusion: Denies having headache, photophobia or neck stiffness. I suspect patient has dementia which has now worsened.  Could be a natural course versus recent worsening.  Head CT does not show any acute findings.  Ordered neurochecks.  Discussed at length with the daughter and she is requesting low-dose Ativan to help with her  confusion/agitation.  Debility: Per the daughter the patient has been feeling more weak since she had the rash.  Fall precautions ordered.  Will order PT/OT consult.  DVT prophylaxis: Although she is a fall risk she is high risk for DVT.  Started on subcutaneous  heparin for DVT prophylaxis Code Status: DNR Family Communication: Plan of care discussed with the daughter at bedside Disposition Plan: Unsure at this time Consults called: None Admission status: Inpatient   Tajia Szeliga MD [facility] Pager [phone] If 7PM-7AM, please contact night-coverage [email or web address] Password [email or web address]  10/25/2018, 1:00 AM

## 2018-10-25 NOTE — ED Notes (Signed)
ED TO INPATIENT HANDOFF REPORT  Name/Age/Gender Rebecca Ford 84 y.o. female  Code Status    Code Status Orders  (From admission, onward)         Start     Ordered   10/25/18 0051  Do not attempt resuscitation (DNR)  Continuous    Question Answer Comment  In the event of cardiac or respiratory ARREST Do not call a "code blue"   In the event of cardiac or respiratory ARREST Do not perform Intubation, CPR, defibrillation or ACLS   In the event of cardiac or respiratory ARREST Use medication by any route, position, wound care, and other measures to relive pain and suffering. May use oxygen, suction and manual treatment of airway obstruction as needed for comfort.      10/25/18 0051        Code Status History    Date Active Date Inactive Code Status Order ID Comments User Context   10/25/2018 0019 10/25/2018 0051 Full Code [id number]  Yaakov Guthrie, MD ED   10/01/2018 2234 10/09/2018 1604 Full Code [id number]  [name], MD ED   11/13/2016 1542 11/16/2016 1643 Full Code [id number]  Leighton Parody, PA-C Inpatient      Home/SNF/Other Skilled nursing   Chief Complaint Allergic Reaction  Level of Care/Admitting Diagnosis ED Disposition    ED Disposition Condition [facility] Area: [facility] [[id number]]  Level of Care: Telemetry [5]  Admit to tele based on following criteria: Other see comments  Comments: tachycardia  Diagnosis: Acute renal failure (ARF) [facility]) [[id number]]  Admitting Physician: Yaakov Guthrie [[id number]]  Attending Physician: Yaakov Guthrie [[id number]]  Estimated length of stay: 3 - 4 days  Certification:: I certify this patient will need inpatient services for at least 2 midnights  PT Class (Do Not Modify): Inpatient [101]  PT Acc Code (Do Not Modify): Private [1]       Medical History Past Medical History:  Diagnosis Date  . Arthritis   . CAD (coronary artery disease)    s/p cath 09/07/10 w/3 vessel CAD, DES  in RCA, Diagonal & LAD  . Dementia ([place])   . HTN (hypertension)   . Hyperlipemia   . MI (myocardial infarction) ([place])    Non-ST elevation 9/11  . Primary osteoarthritis of right knee 09/2016    Allergies Allergies  Allergen Reactions  . Codeine Other (See Comments)    Makes her severely depressed     IV Location/Drains/Wounds Patient Lines/Drains/Airways Status   Active Line/Drains/Airways    Name:   Placement date:   Placement time:   Site:   Days:   Peripheral IV 10/24/18 Left Antecubital   10/24/18    -    Antecubital   1   Peripheral IV 10/24/18 Right Antecubital   10/24/18    2302    Antecubital   1   External Urinary Catheter   10/24/18    2329    -   1          Labs/Imaging Results for orders placed or performed during the hospital encounter of 10/24/18 (from the past 48 hour(s))  CBC     Status: Abnormal   Collection Time: 10/24/18 10:56 PM  Result Value Ref Range   WBC 20.5 (H) 4.0 - 10.5 K/uL   RBC 3.60 (L) 3.87 - 5.11 MIL/uL   Hemoglobin 11.8 (L) 12.0 - 15.0 g/dL   HCT 37.1 36.0 - 46.0 %   MCV 103.1 (H)  80.0 - 100.0 fL   MCH 32.8 26.0 - 34.0 pg   MCHC 31.8 30.0 - 36.0 g/dL   RDW 13.5 11.5 - 15.5 %   Platelets 282 150 - 400 K/uL   nRBC 0.0 0.0 - 0.2 %    Comment: Performed at [facility], [place] [address], [place], [place] [address]  Comprehensive metabolic panel     Status: Abnormal   Collection Time: 10/24/18 10:56 PM  Result Value Ref Range   Sodium 140 135 - 145 mmol/L   Potassium 3.6 3.5 - 5.1 mmol/L   Chloride 109 98 - 111 mmol/L   CO2 20 (L) 22 - 32 mmol/L   Glucose, Bld 117 (H) 70 - 99 mg/dL   BUN 75 (H) 8 - 23 mg/dL   Creatinine, Ser 1.68 (H) 0.44 - 1.00 mg/dL   Calcium 9.5 8.9 - 10.3 mg/dL   Total Protein 7.0 6.5 - 8.1 g/dL   Albumin 2.6 (L) 3.5 - 5.0 g/dL   AST 39 15 - 41 U/L   ALT 30 0 - 44 U/L   Alkaline Phosphatase 148 (H) 38 - 126 U/L   Total Bilirubin 0.7 0.3 - 1.2 mg/dL   GFR calc non Af Amer 26 (L) >60 mL/min    GFR calc Af Amer 31 (L) >60 mL/min    Comment: (NOTE) The eGFR has been calculated using the CKD EPI equation. This calculation has not been validated in all clinical situations. eGFR's persistently <60 mL/min signify possible Chronic Kidney Disease.    Anion gap 11 5 - 15    Comment: Performed at [facility], [place] [address] [place], [place] [address]   Dg Chest 2 View  Result Date: 10/24/2018 CLINICAL DATA:  84 y/o  F; progressive rash all over the body. EXAM: CHEST - 2 VIEW COMPARISON:  10/01/2018 chest radiograph. 10/04/2018 esophagram. FINDINGS: Stable cardiac silhouette given projection and technique. Stable small right-sided pleural effusion and basilar opacity. No new consolidation. No pneumothorax. Stable hiatal hernia. No acute osseous abnormality is evident. IMPRESSION: Stable small right-sided pleural effusion and basilar opacity. Stable hiatal hernia. No new consolidation. Electronically Signed   By: Kristine Garbe M.D.   On: 10/24/2018 22:01   Ct Head Wo Contrast  Result Date: 10/24/2018 CLINICAL DATA:  Acute onset of altered mental status. Confusion and hallucinations. Found down. EXAM: CT HEAD WITHOUT CONTRAST TECHNIQUE: Contiguous axial images were obtained from the base of the skull through the vertex without intravenous contrast. COMPARISON:  None. FINDINGS: Brain: No evidence of acute infarction, hemorrhage, hydrocephalus, extra-axial collection or mass lesion / mass effect. Evaluation is somewhat suboptimal due to motion artifact at the vertex. Prominence of the ventricles and sulci reflects mild cortical volume loss. Scattered periventricular and subcortical white matter change likely reflects small vessel ischemic microangiopathy. The brainstem and fourth ventricle are within normal limits. The basal ganglia are unremarkable in appearance. The cerebral hemispheres demonstrate grossly normal gray-white differentiation. No mass effect or midline  shift is seen. Vascular: No hyperdense vessel or unexpected calcification. Skull: There is no evidence of fracture; visualized osseous structures are unremarkable in appearance. Sinuses/Orbits: The orbits are within normal limits. The paranasal sinuses and mastoid air cells are well-aerated. Other: No significant soft tissue abnormalities are seen. IMPRESSION: 1. No acute intracranial pathology seen on CT. 2. Mild cortical volume loss and scattered small vessel ischemic microangiopathy. Electronically Signed   By: Garald Balding M.D.   On: 10/24/2018 22:24   EKG Interpretation  Date/Time:  Friday October 25 2018 00:13:28 EST Ventricular Rate:  106 PR Interval:    QRS Duration: 100 QT Interval:  344 QTC Calculation: 457 R Axis:   -64 Text Interpretation:  Sinus tachycardia Left anterior fascicular block Low voltage, precordial leads Baseline wander in lead(s) V5 Since last tracing rate faster Confirmed by Dorie Rank [phone]) on 10/25/2018 12:22:09 AM   Pending Labs Unresulted Labs (From admission, onward)    Start     Ordered   10/25/18 0500  CBC with Differential/Platelet  Tomorrow morning,   R     10/25/18 0019   10/25/18 0500  Comprehensive metabolic panel  Tomorrow morning,   R     10/25/18 0019   10/25/18 0009  Creatinine, serum  (heparin)  Once,   R    Comments:  Baseline for heparin therapy IF NOT ALREADY DRAWN.    10/25/18 0019   10/24/18 2332  Urine Culture  Once,   STAT     10/24/18 2331   10/24/18 2106  Urinalysis, Routine w reflex microscopic  Once,   R     10/24/18 2105          Vitals/Pain Today's Vitals   10/24/18 2006 10/24/18 2321 10/24/18 2322 10/25/18 0030  BP:  (!) 126/114  128/65  Pulse:  (!) 110 (!) 110 (!) 106  Resp:  20 20 (!) 25  Temp:      TempSrc:      SpO2:  98% 100% 91%  Weight: 61.4 kg     Height: '5\' 2"'  (1.575 m)     PainSc: 0-No pain       Isolation Precautions No active isolations  Medications Medications  heparin injection 5,000  Units (has no administration in time range)  acetaminophen (TYLENOL) tablet 650 mg (has no administration in time range)    Or  acetaminophen (TYLENOL) suppository 650 mg (has no administration in time range)  0.9 %  sodium chloride infusion (has no administration in time range)  diphenhydrAMINE (BENADRYL) injection 12.5 mg (has no administration in time range)  methylPREDNISolone sodium succinate (SOLU-MEDROL) 40 mg/mL injection 40 mg (has no administration in time range)  predniSONE (DELTASONE) tablet 40 mg (40 mg Oral Given 10/24/18 2129)  sodium chloride 0.9 % bolus 500 mL (500 mLs Intravenous Bolus from Bag 10/25/18 0014)    Mobility walks with person assist

## 2018-10-25 NOTE — Progress Notes (Signed)
Pharmacy IV to PO conversion  The patient is receiving diphenhydramine by the intravenous route.  Based on the following criteria approved by the Pharmacy and [place], diphenhydramine is being converted to the equivalent oral dose form.   Not prescribed to treat or prevent a severe allergic reaction   Not prescribed as premedication prior to receiving blood product, biologic medication, antimicrobial, or chemotherapy agent   The patient has tolerated at least one dose of an oral or enteral medication   The patient has no evidence of active gastrointestinal bleeding or impaired GI absorption (gastrectomy, short bowel, patient on TNA or NPO).   The patient is not undergoing procedural sedation  SLP recommendations include thin liquids; have changed to IV to oral liquid which may be thickened in the future as needed for safe administration  If you have any questions about this conversion, please contact the Pharmacy Department (ext [phone]).  Thank you.  Reuel Boom, PharmD, BCPS [phone] 10/25/2018, 12:26 PM

## 2018-10-25 NOTE — Progress Notes (Signed)
OT Cancellation Note  Patient Details Name: Rebecca Ford MRN: [id number] DOB: 04-15-1930   Cancelled Treatment:    Reason Eval/Treat Not Completed: Other (comment)(Pt in bed and daughter requesting that she rest at this time.)  [place], [place] [phone] [phone]    10/25/2018, 11:17 AM

## 2018-10-26 DIAGNOSIS — N19 Unspecified kidney failure: Secondary | ICD-10-CM

## 2018-10-26 DIAGNOSIS — N390 Urinary tract infection, site not specified: Secondary | ICD-10-CM

## 2018-10-26 DIAGNOSIS — D72829 Elevated white blood cell count, unspecified: Secondary | ICD-10-CM

## 2018-10-26 DIAGNOSIS — G9341 Metabolic encephalopathy: Principal | ICD-10-CM

## 2018-10-26 LAB — URINE CULTURE

## 2018-10-26 LAB — BASIC METABOLIC PANEL
ANION GAP: 7 (ref 5–15)
BUN: 64 mg/dL — ABNORMAL HIGH (ref 8–23)
CALCIUM: 8.8 mg/dL — AB (ref 8.9–10.3)
CO2: 17 mmol/L — AB (ref 22–32)
CREATININE: 1.14 mg/dL — AB (ref 0.44–1.00)
Chloride: 119 mmol/L — ABNORMAL HIGH (ref 98–111)
GFR calc Af Amer: 49 mL/min — ABNORMAL LOW (ref >60)
GFR, EST NON AFRICAN AMERICAN: 42 mL/min — AB (ref >60)
Glucose, Bld: 138 mg/dL — ABNORMAL HIGH (ref 70–99)
Potassium: 4.2 mmol/L (ref 3.5–5.1)
SODIUM: 143 mmol/L (ref 135–145)

## 2018-10-26 LAB — CBC
HCT: 31.9 % — ABNORMAL LOW (ref 36.0–46.0)
Hemoglobin: 9.9 g/dL — ABNORMAL LOW (ref 12.0–15.0)
MCH: 32.1 pg (ref 26.0–34.0)
MCHC: 31 g/dL (ref 30.0–36.0)
MCV: 103.6 fL — AB (ref 80.0–100.0)
PLATELETS: 245 10*3/uL (ref 150–400)
RBC: 3.08 MIL/uL — ABNORMAL LOW (ref 3.87–5.11)
RDW: 13.4 % (ref 11.5–15.5)
WBC: 17.8 10*3/uL — AB (ref 4.0–10.5)
nRBC: 0 % (ref 0.0–0.2)

## 2018-10-26 NOTE — Plan of Care (Signed)
Pt on FLD and consuming a variety of fluids. Pt calm and cooperative this shift. Pt itching from rash-Benadryl given.

## 2018-10-26 NOTE — Evaluation (Signed)
Physical Therapy Evaluation Patient Details Name: Rebecca Ford MRN: [id number] DOB: Sep 03, 1930 Today's Date: 10/26/2018   History of Present Illness  84 year old female with coronary artery disease, hypertension, dyslipidemia, OA, MI, cataracts and recent hospitalization for decompensated diastolic CHF, failure to thrive was discharged to SNF brought to the ED with increasing confusion, diffuse macular rash  Clinical Impression  Pt admitted with above diagnosis. Pt currently with functional limitations due to the deficits listed below (see PT Problem List).  Pt will benefit from skilled PT to increase their independence and safety with mobility to allow discharge to the venue listed below.  Pt agreeable to mobilize however upon standing started rushing to bathroom despite safety cues.  Pt assisted with hygiene, gown change, pericare and then assisted back to bed.  Pt with [place] occasionally out of nose and cues provided for maintaining nasal cannula.  O2 [place] off pt on arrival and SPO2 with poor wave form and reading 89% so reapplied [place].  Pt on 4L O2 [place] throughout session.  Pt poor historian and no family present on evaluation.  Recommend d/c to SNF as pt is high fall risk and requiring safety cues.     Follow Up Recommendations Supervision for mobility/OOB;SNF    Equipment Recommendations  None recommended by PT    Recommendations for Other Services       Precautions / Restrictions Precautions Precautions: Fall Precaution Comments: currently on 4L O2       Mobility  Bed Mobility Overal bed mobility: Needs Assistance Bed Mobility: Supine to Sit;Sit to Supine     Supine to sit: Min guard;HOB elevated Sit to supine: Min guard;HOB elevated   General bed mobility comments: Increased time/effort to perform.  Transfers Overall transfer level: Needs assistance Equipment used: Rolling walker (2 wheeled) Transfers: Sit to/from Stand Sit to Stand: Min assist         General  transfer comment: assist for steadying, placed RW for pt however pt moved RW out of the way to get to bathroom "quick"  Ambulation/Gait Ambulation/Gait assistance: Min assist Gait Distance (Feet): 10 Feet(x2) Assistive device: Rolling walker (2 wheeled) Gait Pattern/deviations: Step-through pattern;Decreased stride length     General Gait Details: pt ambulated quickly to bathroom, moving RW aside and holding onto furniture despite cues to stop for safety (line, oxygen, no AD) however pt started BM on the way to bathroom; pt more receptive to safety cues upon returning to bed, remained on 4L O2  during session  Stairs            Wheelchair Mobility    Modified Rankin (Stroke Patients Only)       Balance Overall balance assessment: History of Falls;Needs assistance         Standing balance support: Bilateral upper extremity supported Standing balance-Leahy Scale: Poor                               Pertinent Vitals/Pain Pain Assessment: No/denies pain    Home Living Family/patient expects to be discharged to:: Other (Comment)(Independent living at [facility] )                 Additional Comments: information from previous admission as pt is poor historian 10/26/18    Prior Function Level of Independence: Needs assistance   Gait / Transfers Assistance Needed: Uses rollator for ambulation in room, and going to meals at cafeteria, going out to eat. Pt has 2 "  drivers" that take her out to eat for dinner 6x/week. Daughter states she has been considering getting pt more assistance for pt from drivers/aides upon d/c.  ADL's / Homemaking Assistance Needed: Assist with cleaning once a month  Comments: Pt has fitness center and a multitude of fitness classes at independent living facility, but pt prefers to rest in her room per daughter. Pt's daughter states pt has been piling trash outside trash can by accident in apartment, has some mild memory  impairments.      Hand Dominance   Dominant Hand: Right    Extremity/Trunk Assessment        Lower Extremity Assessment Lower Extremity Assessment: Generalized weakness       Communication   Communication: No difficulties  Cognition Arousal/Alertness: Awake/alert Behavior During Therapy: WFL for tasks assessed/performed Overall Cognitive Status: No family/caregiver present to determine baseline cognitive functioning Area of Impairment: Memory;Safety/judgement;Following commands                 Orientation Level: Time;Disoriented to;Situation   Memory: Decreased short-term memory Following Commands: Follows one step commands inconsistently Safety/Judgement: Decreased awareness of safety;Decreased awareness of deficits     General Comments: rushing to use bathroom despite cues to stop for safety, wouldn't wait for [facility]      General Comments      Exercises     Assessment/Plan    PT Assessment Patient needs continued PT services  PT Problem List Decreased strength;Decreased activity tolerance;Decreased knowledge of use of DME;Decreased balance;Decreased mobility       PT Treatment Interventions DME instruction;Gait training;Therapeutic exercise;Balance training;Patient/family education;Functional mobility training    PT Goals (Current goals can be found in the Care Plan section)  Acute Rehab PT Goals PT Goal Formulation: Patient unable to participate in goal setting Time For Goal Achievement: 11/09/18 Potential to Achieve Goals: Good    Frequency Min 2X/week   Barriers to discharge        Co-evaluation               AM-PAC PT "6 Clicks" Daily Activity  Outcome Measure Difficulty turning over in bed (including adjusting bedclothes, sheets and blankets)?: A Little Difficulty moving from lying on back to sitting on the side of the bed? : A Little Difficulty sitting down on and standing up from a chair with arms (e.g., wheelchair, bedside  commode, etc,.)?: Unable Help needed moving to and from a bed to chair (including a wheelchair)?: A Little Help needed walking in hospital room?: A Little Help needed climbing 3-5 steps with a railing? : A Lot 6 Click Score: 15    End of Session Equipment Utilized During Treatment: Gait belt;Oxygen Activity Tolerance: Patient tolerated treatment well Patient left: with bed alarm set;in bed;with call bell/phone within reach Nurse Communication: Mobility status PT Visit Diagnosis: Difficulty in walking, not elsewhere classified (R26.2);Muscle weakness (generalized) (M62.81)    Time: 4010-2725 PT Time Calculation (min) (ACUTE ONLY): 30 min   Charges:   PT Evaluation $PT Eval Low Complexity: 1 Low PT Treatments $Gait Training: 8-22 mins       Carmelia Bake, PT, DPT Acute Rehabilitation Services Office: [phone] Pager: [phone]  Trena Platt 10/26/2018, 12:52 PM

## 2018-10-26 NOTE — Progress Notes (Signed)
OT Cancellation Note  Patient Details Name: Rebecca Ford MRN: [id number] DOB: 11-16-1930   Cancelled Treatment:    Reason Eval/Treat Not Completed: Other (comment) Met with pt, fatigued and half asleep in bed. Daughter is present, asking to hold OT this date for pt to rest. Dtr spoke with OT at length about concerns asking for note for better communication between disciplines. [name], was there very short term when developed rash and decreased cognition. She is also having difficulties feeding. Dtr states pt is very good with confabulation, but has severe deficits cognitively. [name]. At baseline she is at an ILF and receives mild support, mostly for IADLs. As of recent, she has had most difficulty with very short term memory and would like any cognitive evals done to ensure best placement for her mother. Will follow up with pt as available and appropriate to initiate OT POC.  Zenovia Jarred, MSOT, OTR/L Behavioral Health OT/ Acute Relief OT [facility] Office: [phone]  Zenovia Jarred 10/26/2018, 3:18 PM

## 2018-10-26 NOTE — Progress Notes (Signed)
PROGRESS NOTE                                                                                                                                                                                                             Patient Demographics:    Rebecca Ford, is a 84 y.o. female, DOB - 07-27-1930, EPP:[id number]  Admit date - 10/24/2018   Admitting Physician [name], MD  Outpatient Primary MD for the patient is [name], MD  LOS - 1  Outpatient Specialists : none  Chief Complaint  Patient presents with  . Allergic Reaction       Brief Narrative   84 year old female with coronary artery disease, hypertension, dyslipidemia, recent hospitalization for decompensated diastolic CHF, failure to thrive was discharged to SNF brought to the ED with increasing confusion, diffuse macular rash.  Per daughter who provided history she does have at least moderate dementia which was not diagnosed until her recent hospitalization.  She has been more forgetful in the recent past.  She was discharged to SNF on Megace recently.  For past 2 days she has noticed diffuse rash in her body with severe itching.  Also complaining of some difficulty swallowing.  No fevers, chills, nausea, vomiting, abdominal pain, shortness of breath, dysuria or diarrhea.  Patient is incontinent and wears diapers. In the ED blood work showed acute kidney injury and leukocytosis.  Admitted for further management.   Subjective:   Much oriented today.  Still has diffuse itching but less compared to yesterday.  Assessment  & Plan :   Diffuse macular skin rash Suspect drug reaction.  Was recently started on Cardizem and Megace.  Could be contributed by medications.  Discontinued Megace and placed on IV Solu-Medrol, PRN IV Benadryl for itching.  No clinical signs of infection except for possible UTI.  Blood culture negative for growth so far.  Active  problems Acute metabolic encephalopathy Possibly worsening of her dementia in the setting of infection (UTI) and dehydration.  Avoid narcotics and benzos.  Low-dose PRN Haldol for agitation.  Head CT negative for acute findings.  Chest x-ray negative for infiltrate. Symptoms much better today.  Acute kidney injury Suspect prerenal with dehydration.  Possible UTI as well.  Improved with hydration.  DC fluids.  Avoid nephrotoxins.  Lasix, HCTZ and Micardis held.  Currently hypovolemic.  UTI Placed on empiric Rocephin.  Follow urine culture.  Leukocytosis Reactive versus secondary to UTI.  Slowly improving a.m. labs.  Essential hypertension On Cardizem and metoprolol.  Chronic diastolic CHF Recent hospitalization for decompensation 2 weeks back with right pleural effusion.  Iron deficiency anemia Received IV iron during last hospitalization.  Severe protein calorie malnutrition On supplement.  Megace held.    Code Status : DNR  Family Communication  : Daughter at bedside  Disposition Plan  : Pending hospital course  Barriers For Discharge : Active symptoms  Consults  : None  Procedures  : Head CT  DVT Prophylaxis  : Subcu heparin  Lab Results  Component Value Date   PLT 245 10/26/2018    Antibiotics  :   Anti-infectives (From admission, onward)   Start     Dose/Rate Route Frequency Ordered Stop   10/25/18 0800  cefTRIAXone (ROCEPHIN) 1 g in sodium chloride 0.9 % 100 mL IVPB     1 g 200 mL/hr over 30 Minutes Intravenous Every 24 hours 10/25/18 0712          Objective:   Vitals:   10/25/18 0136 10/25/18 0540 10/25/18 2145 10/26/18 0501  BP: (!) 146/62 134/72 125/89 (!) 121/59  Pulse: (!) 108 92 (!) 128 82  Resp: 20 20 18 14   Temp: 99.5 F (37.5 C) 98.4 F (36.9 C) 97.7 F (36.5 C) 97.8 F (36.6 C)  TempSrc: Oral Oral Oral Oral  SpO2: 100% 98% 95% 100%  Weight: 26.9 kg     Height: 5\' 1"  (1.549 m)       Wt Readings from Last 3 Encounters:   10/25/18 26.9 kg  10/09/18 61.4 kg  05/03/17 64.1 kg     Intake/Output Summary (Last 24 hours) at 10/26/2018 1335 Last data filed at 10/26/2018 0600 Gross per 24 hour  Intake 2192.79 ml  Output 500 ml  Net 1692.79 ml   Physical exam Not in distress, much alert and oriented HEENT: Moist mucosa, supple neck, over the face improved Chest: Clear bilaterally CVs: Normal S1-S2, no murmurs GI: Soft, nondistended, nontender Muscular skeletal: Warm, no edema, diffuse rash slowly improving CNS: Alert and oriented x2-3, improved mentation      Data Review:    CBC Recent Labs  Lab 10/24/18 2256 10/25/18 0516 10/26/18 0432  WBC 20.5* 16.4* 17.8*  HGB 11.8* 10.8* 9.9*  HCT 37.1 33.5* 31.9*  PLT 282 252 245  MCV 103.1* 100.6* 103.6*  MCH 32.8 32.4 32.1  MCHC 31.8 32.2 31.0  RDW 13.5 13.4 13.4  LYMPHSABS  --  0.8  --   MONOABS  --  0.1  --   EOSABS  --  0.6*  --   BASOSABS  --  0.0  --     Chemistries  Recent Labs  Lab 10/24/18 2256 10/25/18 0009 10/25/18 0516 10/26/18 0432  NA 140  --  142 143  K 3.6  --  3.8 4.2  CL 109  --  113* 119*  CO2 20*  --  19* 17*  GLUCOSE 117*  --  130* 138*  BUN 75*  --  70* 64*  CREATININE 1.68* 1.70* 1.54* 1.14*  CALCIUM 9.5  --  9.1 8.8*  AST 39  --  34  --   ALT 30  --  29  --   ALKPHOS 148*  --  138*  --   BILITOT 0.7  --  0.6  --    ------------------------------------------------------------------------------------------------------------------ No results for input(s): CHOL,  HDL, LDLCALC, TRIG, CHOLHDL, LDLDIRECT in the last 72 hours.  No results found for: HGBA1C ------------------------------------------------------------------------------------------------------------------ No results for input(s): TSH, T4TOTAL, T3FREE, THYROIDAB in the last 72 hours.  Invalid input(s): FREET3 ------------------------------------------------------------------------------------------------------------------ No results for input(s):  VITAMINB12, FOLATE, FERRITIN, TIBC, IRON, RETICCTPCT in the last 72 hours.  Coagulation profile No results for input(s): INR, PROTIME in the last 168 hours.  No results for input(s): DDIMER in the last 72 hours.  Cardiac Enzymes No results for input(s): CKMB, TROPONINI, MYOGLOBIN in the last 168 hours.  Invalid input(s): CK ------------------------------------------------------------------------------------------------------------------    Component Value Date/Time   BNP 109.8 (H) 10/01/2018 1700    Inpatient Medications  Scheduled Meds: . amitriptyline  50 mg Oral QHS  . aspirin EC  81 mg Oral Daily  . atorvastatin  40 mg Oral QHS  . diltiazem  30 mg Oral Q12H  . feeding supplement (PRO-STAT SUGAR FREE 64)  30 mL Oral BID  . heparin  5,000 Units Subcutaneous Q12H  . methylPREDNISolone (SOLU-MEDROL) injection  40 mg Intravenous Q12H  . metoprolol tartrate  50 mg Oral BID  . pantoprazole  40 mg Oral BID  . potassium chloride SA  20 mEq Oral Daily   Continuous Infusions: . sodium chloride 100 mL/hr at 10/26/18 0005  . cefTRIAXone (ROCEPHIN)  IV 1 g (10/26/18 0919)   PRN Meds:.acetaminophen **OR** acetaminophen, diphenhydrAMINE, haloperidol lactate, loratadine, magic mouthwash w/lidocaine, nitroGLYCERIN, phenol, polyethylene glycol  Micro Results Recent Results (from the past 240 hour(s))  MRSA PCR Screening     Status: None   Collection Time: 10/25/18  2:18 AM  Result Value Ref Range Status   MRSA by PCR NEGATIVE NEGATIVE Final    Comment:        The GeneXpert MRSA Assay (FDA approved for NASAL specimens only), is one component of a comprehensive MRSA colonization surveillance program. It is not intended to diagnose MRSA infection nor to guide or monitor treatment for MRSA infections. Performed at [facility], [place] [address], [place], [place] [address]   Urine Culture     Status: None   Collection Time: 10/25/18  3:08 AM  Result Value Ref  Range Status   Specimen Description   Final    URINE, RANDOM Performed at [facility] [address] [place], [place] [address]    Special Requests   Final    NONE Performed at [facility], [place] [address] [place], [place] [address]    Culture   Final    Multiple bacterial morphotypes present, none predominant. Suggest appropriate recollection if clinically indicated.   Report Status 10/26/2018 FINAL  Final  Culture, blood (routine x 2)     Status: None (Preliminary result)   Collection Time: 10/25/18  3:58 PM  Result Value Ref Range Status   Specimen Description   Final    BLOOD LEFT ARM Performed at [facility] [address], [place], [place] [address]    Special Requests   Final    BOTTLES DRAWN AEROBIC AND ANAEROBIC Blood Culture adequate volume Performed at [facility] [address], [place], [place] [address]    Culture   Final    NO GROWTH < 12 HOURS Performed at [facility] [address] [place], [place] [address]    Report Status PENDING  Incomplete  Culture, blood (routine x 2)     Status: None (Preliminary result)   Collection Time: 10/25/18  4:06 PM  Result Value Ref Range Status   Specimen Description   Final    BLOOD LEFT ARM Performed at [facility] [address] [place], [place] [address]    Special Requests   Final    BOTTLES DRAWN AEROBIC AND ANAEROBIC Blood Culture adequate volume Performed at [facility] [address], [place], [place] [address]    Culture   Final    NO GROWTH < 12 HOURS Performed at [facility] [address] [place],  [address]    Report Status PENDING  Incomplete    Radiology Reports Dg Chest 2 View  Result Date: 10/24/2018 CLINICAL DATA:  84 y/o  F; progressive rash all over the body. EXAM: CHEST - 2 VIEW COMPARISON:  10/01/2018 chest radiograph. 10/04/2018 esophagram.  FINDINGS: Stable cardiac silhouette given projection and technique. Stable small right-sided pleural effusion and basilar opacity. No new consolidation. No pneumothorax. Stable hiatal hernia. No acute osseous abnormality is evident. IMPRESSION: Stable small right-sided pleural effusion and basilar opacity. Stable hiatal hernia. No new consolidation. Electronically Signed   By: Kristine Garbe M.D.   On: 10/24/2018 22:01   Dg Chest 2 View  Result Date: 10/01/2018 CLINICAL DATA:  84 y/o F; 4-5 days of weakness. Shortness of breath, cough, rales. EXAM: CHEST - 2 VIEW COMPARISON:  10/31/2016 chest radiograph. FINDINGS: Stable mildly enlarged cardiac silhouette given projection and technique. Small right pleural effusion. Right lung base opacification. Left lung base platelike atelectasis. No pneumothorax. Multilevel degenerative changes of the spine. No acute osseous abnormality is evident. IMPRESSION: Small right pleural effusion and basilar opacity which may represent associated atelectasis or pneumonia. Electronically Signed   By: Kristine Garbe M.D.   On: 10/01/2018 19:25   Ct Head Wo Contrast  Result Date: 10/24/2018 CLINICAL DATA:  Acute onset of altered mental status. Confusion and hallucinations. Found down. EXAM: CT HEAD WITHOUT CONTRAST TECHNIQUE: Contiguous axial images were obtained from the base of the skull through the vertex without intravenous contrast. COMPARISON:  None. FINDINGS: Brain: No evidence of acute infarction, hemorrhage, hydrocephalus, extra-axial collection or mass lesion / mass effect. Evaluation is somewhat suboptimal due to motion artifact at the vertex. Prominence of the ventricles and sulci reflects mild cortical volume loss. Scattered periventricular and subcortical white matter change likely reflects small vessel ischemic microangiopathy. The brainstem and fourth ventricle are within normal limits. The basal ganglia are unremarkable in appearance. The  cerebral hemispheres demonstrate grossly normal gray-white differentiation. No mass effect or midline shift is seen. Vascular: No hyperdense vessel or unexpected calcification. Skull: There is no evidence of fracture; visualized osseous structures are unremarkable in appearance. Sinuses/Orbits: The orbits are within normal limits. The paranasal sinuses and mastoid air cells are well-aerated. Other: No significant soft tissue abnormalities are seen. IMPRESSION: 1. No acute intracranial pathology seen on CT. 2. Mild cortical volume loss and scattered small vessel ischemic microangiopathy. Electronically Signed   By: Garald Balding M.D.   On: 10/24/2018 22:24   Ct Angio Chest Pe W Or Wo Contrast  Result Date: 10/02/2018 CLINICAL DATA:  84 year old with bilateral leg edema and fatigue. Elevated D-dimer. EXAM: CT ANGIOGRAPHY CHEST WITH CONTRAST TECHNIQUE: Multidetector CT imaging of the chest was performed using the standard protocol during bolus administration of intravenous contrast. Multiplanar CT image reconstructions and MIPs were obtained to evaluate the vascular anatomy. CONTRAST:  192mL ISOVUE-370 IOPAMIDOL (ISOVUE-370) INJECTION 76% COMPARISON:  Chest radiograph earlier this day. FINDINGS: Cardiovascular: There are no filling defects within  the pulmonary arteries to suggest pulmonary embolus. Aortic atherosclerosis and tortuosity without dissection. Conventional branching pattern from the aortic arch. Mild multi chamber cardiomegaly. There are coronary artery calcifications. No significant pericardial effusion. Mediastinum/Nodes: Multiple small epicardial nodes, nonspecific. No enlarged mediastinal or hilar lymph nodes. Small to moderate hiatal hernia. Upper esophagus is patulous. 3 cm right thyroid or parathyroid nodule. Enlargement of the right thyroid lobe compared to left. Lungs/Pleura: Moderate right pleural effusion is partially loculated. Associated compressive atelectasis in the right lower and  middle lobes. There is a small left pleural effusion with adjacent atelectasis. Vague ground-glass opacities with minimal septal thickening at the apices. No suspicious pulmonary mass. Upper Abdomen: Upper abdominal ascites adjacent to the liver and spleen. Musculoskeletal: Lucency in the upper sternal body with surrounding calcification, suggesting subacute fracture. Degenerative change in the spine. Review of the MIP images confirms the above findings. IMPRESSION: 1. No pulmonary embolus. 2. Moderate right pleural effusion, partially loculated. There is adjacent compressive atelectasis. Small left pleural effusion. 3. Minimal ground-glass opacities and septal thickening at the apices, possible early pulmonary edema. Multi chamber cardiomegaly. 4. Ascites in the upper abdomen. 5. Sternal lucency with surrounding calcifications, likely subacute or remote fracture. Focal lytic bone lesion is not excluded on imaging findings alone, but felt less likely particularly in the absence of known malignancy. 6. A 3 cm hypodense nodule abutting the right lobe of the thyroid gland may be an exophytic thyroid nodule or parathyroid nodule. Thyroid ultrasound may be of value, giving consideration for patient's advanced age. 7. Aortic Atherosclerosis (ICD10-I70.0). Coronary artery calcifications. Electronically Signed   By: Keith Rake M.D.   On: 10/02/2018 05:51   Dg Esophagus  Result Date: 10/04/2018 CLINICAL DATA:  Difficulty swallowing. EXAM: ESOPHOGRAM/BARIUM SWALLOW TECHNIQUE: Single contrast examination was performed using thin barium. A barium tablet was also administered FLUOROSCOPY TIME:  Fluoroscopy Time:  2.9 minutes Radiation Exposure Index (if provided by the fluoroscopic device): 55.9 mGy Number of Acquired Spot Images: 3 COMPARISON:  None. FINDINGS: The proximal and mid esophagus are widely patent. No stricture or mass. The motility of the esophagus is abnormal with multiple disorganized tertiary waves. A  large hiatal hernia is identified. This contains approximately 20% intrathoracic stomach. Transient narrowing of the distal esophagus at the level of the GE junction noted which resulted and attenuation of tablet transit into the intrathoracic portions of the stomach. After several swallows of thin barium and water the tablet eventually passed into the stomach. Mild reflux identified. IMPRESSION: 1. Large hiatal hernia. 2. Esophageal dysmotility. Electronically Signed   By: Kerby Moors M.D.   On: 10/04/2018 15:31   US Abdomen Limited  Result Date: 10/02/2018 CLINICAL DATA:  Abdominal distension EXAM: LIMITED ABDOMEN ULTRASOUND FOR ASCITES TECHNIQUE: Limited ultrasound survey for ascites was performed in all four abdominal quadrants. COMPARISON:  10/02/2018 FINDINGS: Survey of the abdominal 4 quadrants demonstrates a small to moderate amount of diffuse ascites. Right pleural effusion also noted. IMPRESSION: Small to moderate abdominopelvic ascites Right pleural effusion Electronically Signed   By: Jerilynn Mages.  Shick M.D.   On: 10/02/2018 15:14    Time Spent in minutes  25   Tymber Stallings M.D on 10/26/2018 at 1:35 PM  Between 7am to 7pm - Pager - [phone]  After 7pm go to [email or web address] - password [email or web address]  [facility] -  Office  [phone]

## 2018-10-27 LAB — TSH: TSH: 0.954 u[IU]/mL (ref 0.350–4.500)

## 2018-10-27 LAB — BASIC METABOLIC PANEL
ANION GAP: 8 (ref 5–15)
BUN: 47 mg/dL — ABNORMAL HIGH (ref 8–23)
CO2: 17 mmol/L — ABNORMAL LOW (ref 22–32)
Calcium: 9.4 mg/dL (ref 8.9–10.3)
Chloride: 119 mmol/L — ABNORMAL HIGH (ref 98–111)
Creatinine, Ser: 0.9 mg/dL (ref 0.44–1.00)
GFR calc Af Amer: >60 mL/min (ref >60)
GFR, EST NON AFRICAN AMERICAN: 56 mL/min — AB (ref >60)
Glucose, Bld: 117 mg/dL — ABNORMAL HIGH (ref 70–99)
POTASSIUM: 4.7 mmol/L (ref 3.5–5.1)
SODIUM: 144 mmol/L (ref 135–145)

## 2018-10-27 LAB — RPR: RPR Ser Ql: NONREACTIVE

## 2018-10-27 LAB — CBC
HCT: 33 % — ABNORMAL LOW (ref 36.0–46.0)
HEMOGLOBIN: 10 g/dL — AB (ref 12.0–15.0)
MCH: 32.5 pg (ref 26.0–34.0)
MCHC: 30.3 g/dL (ref 30.0–36.0)
MCV: 107.1 fL — ABNORMAL HIGH (ref 80.0–100.0)
Platelets: 239 10*3/uL (ref 150–400)
RBC: 3.08 MIL/uL — ABNORMAL LOW (ref 3.87–5.11)
RDW: 13.5 % (ref 11.5–15.5)
WBC: 15.4 10*3/uL — ABNORMAL HIGH (ref 4.0–10.5)
nRBC: 0 % (ref 0.0–0.2)

## 2018-10-27 LAB — HIV ANTIBODY (ROUTINE TESTING W REFLEX): HIV SCREEN 4TH GENERATION: NONREACTIVE

## 2018-10-27 MED ORDER — HYDROCORTISONE 1 % EX CREA
TOPICAL_CREAM | Freq: Three times a day (TID) | CUTANEOUS | Status: DC
  Administered 2018-10-27 – 2018-11-03 (×21): via TOPICAL
  Filled 2018-10-27 (×2): qty 28

## 2018-10-27 MED ORDER — METHYLPREDNISOLONE SODIUM SUCC 40 MG IJ SOLR
40.0000 mg | Freq: Every day | INTRAMUSCULAR | Status: DC
  Administered 2018-10-27: 40 mg via INTRAVENOUS
  Filled 2018-10-27: qty 1

## 2018-10-27 MED ORDER — FUROSEMIDE 20 MG PO TABS
20.0000 mg | ORAL_TABLET | Freq: Every day | ORAL | Status: DC
  Administered 2018-10-27 – 2018-10-30 (×4): 20 mg via ORAL
  Filled 2018-10-27 (×5): qty 1

## 2018-10-27 NOTE — Plan of Care (Signed)
Pt able to safely ambulate to BR on day Shift and tolerated well. Pt more A&O on night shift. Able to tell me the month and year and who cam to visit her during the day.  Pt states she is hungry-FLD not enough for her.

## 2018-10-27 NOTE — Progress Notes (Signed)
Occupational Therapy Evaluation Patient Details Name: Rebecca Ford MRN: [id number] DOB: 11-12-1930 Today's Date: 10/27/2018    History of Present Illness 84 year old female with coronary artery disease, hypertension, dyslipidemia, OA, MI, cataracts and recent hospitalization for decompensated diastolic CHF, failure to thrive was discharged to SNF brought to the ED with increasing confusion, diffuse macular rash   Clinical Impression   Limited evaluation due to patient not wanting to get EOB/OOB during session today. Patient states she lives in independent living at [facility]. Feel she needs more assistance at this time. Recommend SNF. OT will follow.    Follow Up Recommendations  SNF;Supervision/Assistance - 24 hour    Equipment Recommendations  Other (comment)(TBD at next venue of care)    Recommendations for Other Services PT consult     Precautions / Restrictions Precautions Precautions: Fall Restrictions Weight Bearing Restrictions: No      Mobility Bed Mobility               General bed mobility comments: NT - pt refused  Transfers                 General transfer comment: NT - pt refused    Balance                                           ADL either performed or assessed with clinical judgement   ADL Overall ADL's : Needs assistance/impaired Eating/Feeding: Set up;Bed level   Grooming: Wash/dry hands;Wash/dry face;Set up;Bed level                     Toilet Transfer Details (indicate cue type and reason): patient reports she has been getting up with staff's assistance           General ADL Comments: Patient refused EOB/OOB during evaluation, but reports she has been getting up with staff's assistance. She was willing to do bed level grooming activities. Patient oriented to self, not place or date.      Vision Baseline Vision/History: Wears glasses Wears Glasses: At all times Patient Visual Report: No  change from baseline       Perception     Praxis      Pertinent Vitals/Pain Pain Assessment: No/denies pain     Hand Dominance Right   Extremity/Trunk Assessment Upper Extremity Assessment Upper Extremity Assessment: Generalized weakness   Lower Extremity Assessment Lower Extremity Assessment: Defer to PT evaluation   Cervical / Trunk Assessment Cervical / Trunk Assessment: Kyphotic   Communication Communication Communication: No difficulties   Cognition Arousal/Alertness: Awake/alert Behavior During Therapy: WFL for tasks assessed/performed Overall Cognitive Status: No family/caregiver present to determine baseline cognitive functioning Area of Impairment: Memory;Safety/judgement;Following commands                 Orientation Level: Time;Disoriented to;Situation   Memory: Decreased short-term memory Following Commands: Follows one step commands inconsistently Safety/Judgement: Decreased awareness of safety;Decreased awareness of deficits         General Comments  rash noted on arms    Exercises     Shoulder Instructions      Home Living Family/patient expects to be discharged to:: Unsure                                 Additional Comments: pt is poor  historian      Prior Functioning/Environment Level of Independence: Needs assistance  Gait / Transfers Assistance Needed: Uses rollator for ambulation in room, and going to meals at cafeteria, going out to eat. Pt has 2 "drivers" that take her out to eat for dinner 6x/week. Daughter states she has been considering getting pt more assistance for pt from drivers/aides upon d/c. ADL's / Homemaking Assistance Needed: Assist with cleaning once a month            OT Problem List: Decreased strength;Decreased activity tolerance;Impaired balance (sitting and/or standing);Decreased safety awareness;Decreased cognition;Decreased knowledge of use of DME or AE;Decreased knowledge of precautions       OT Treatment/Interventions: Self-care/ADL training;DME and/or AE instruction;Therapeutic activities;Patient/family education    OT Goals(Current goals can be found in the care plan section) Acute Rehab OT Goals Patient Stated Goal: none stated  OT Goal Formulation: Patient unable to participate in goal setting Time For Goal Achievement: 11/10/18 Potential to Achieve Goals: Fair  OT Frequency: Min 2X/week   Barriers to D/C:            Co-evaluation              AM-PAC PT "6 Clicks" Daily Activity     Outcome Measure Help from another person eating meals?: None Help from another person taking care of personal grooming?: A Little Help from another person toileting, which includes using toliet, bedpan, or urinal?: A Lot Help from another person bathing (including washing, rinsing, drying)?: A Lot Help from another person to put on and taking off regular upper body clothing?: A Lot Help from another person to put on and taking off regular lower body clothing?: A Lot 6 Click Score: 15   End of Session    Activity Tolerance: Patient tolerated treatment well Patient left: in bed;with call bell/phone within reach;with bed alarm set  OT Visit Diagnosis: Unsteadiness on feet (R26.81);Other symptoms and signs involving cognitive function                Time: 1007-1020 OT Time Calculation (min): 13 min Charges:  OT General Charges $OT Visit: 1 Visit OT Evaluation $OT Eval Low Complexity: 1 Low    Navjot Pilgrim A Katrina Daddona 10/27/2018, 11:47 AM

## 2018-10-27 NOTE — Plan of Care (Signed)
Patient continues to c/o intermittent itching, improved   with Benadryl.  Appetite continues to be poor.  Daughter at bedside, did speak personally with rounding physician.

## 2018-10-27 NOTE — Progress Notes (Addendum)
PROGRESS NOTE                                                                                                                                                                                                             Patient Demographics:    Rebecca Ford, is a 84 y.o. female, DOB - 07-Dec-1930, QQP:[id number]  Admit date - 10/24/2018   Admitting Physician [name], MD  Outpatient Primary MD for the patient is [name], MD  LOS - 2  Outpatient Specialists : none  Chief Complaint  Patient presents with  . Allergic Reaction       Brief Narrative   84 year old female with coronary artery disease, hypertension, dyslipidemia, recent hospitalization for decompensated diastolic CHF, failure to thrive was discharged to SNF brought to the ED with increasing confusion, diffuse macular rash.  Per daughter who provided history she does have at least moderate dementia which was not diagnosed until her recent hospitalization.  She has been more forgetful in the recent past.  She was discharged to SNF on Megace recently.  For past 2 days she has noticed diffuse rash in her body with severe itching.  Also complaining of some difficulty swallowing.  No fevers, chills, nausea, vomiting, abdominal pain, shortness of breath, dysuria or diarrhea.  Patient is incontinent and wears diapers. In the ED blood work showed acute kidney injury and leukocytosis.  Admitted for further management.   Subjective:   Mentation unchanged from yesterday.  Still complains of generalized itching.  No other overnight events.  Assessment  & Plan :   Diffuse macular skin rash Suspect drug reaction.  Was recently started on Cardizem and Megace.  Could be contributed by medications.  Discontinued Megace and placed on IV Solu-Medrol, PRN IV Benadryl for itching.  No clinical signs of infection except for possible UTI.  Blood and urine cultures  negative.  Active problems Acute metabolic encephalopathy Possibly worsening of her dementia in the setting of infection (UTI) and dehydration.  Avoid narcotics and benzos.  discontinued amitriptyline and Claritin.  Low-dose PRN Haldol for agitation.  Head CT negative for acute findings.  Chest x-ray negative for infiltrate.   Encephalopathy has improved in the past 24-48 hours.  Acute kidney injury Suspect prerenal with dehydration.  Possible UTI as well.  Improved with hydration.  All fluids and resumed  Lasix.  Hold HCTZ and Micardis.  Avoid nephrotoxins.    UTI On empiric Rocephin.  Cultures negative.  Will treat for 5 days.  Leukocytosis Reactive versus secondary to UTI.  Slowly improving a.m. labs.  Essential hypertension On Cardizem and metoprolol.  Chronic diastolic CHF Recent hospitalization for decompensation 2 weeks back with right pleural effusion.  Iron deficiency anemia Received IV iron during last hospitalization.  Severe protein calorie malnutrition On supplement.  Megace held.  Generalized weakness Seen by PT/OT and recommend SNF as patient needs a lot of skilled care and daughter unable to take care of her at home.  Code Status : DNR  Family Communication  :  will update daughter.  Disposition Plan  : SNF possibly in the next 24-48 hours if rash improves.  Barriers For Discharge : Active symptoms  Consults  : None  Procedures  : Head CT  DVT Prophylaxis  : Subcu heparin  Lab Results  Component Value Date   PLT 239 10/27/2018    Antibiotics  :   Anti-infectives (From admission, onward)   Start     Dose/Rate Route Frequency Ordered Stop   10/25/18 0800  cefTRIAXone (ROCEPHIN) 1 g in sodium chloride 0.9 % 100 mL IVPB     1 g 200 mL/hr over 30 Minutes Intravenous Every 24 hours 10/25/18 0712          Objective:   Vitals:   10/26/18 1413 10/26/18 2217 10/27/18 0457 10/27/18 0931  BP: 132/61 (!) 149/74 (!) 157/75   Pulse: 76 95 87   Resp:  14 12 18    Temp: 97.9 F (36.6 C) 97.8 F (36.6 C) 98.7 F (37.1 C)   TempSrc: Oral Oral Oral   SpO2: 94% 100% 100% 97%  Weight:      Height:        Wt Readings from Last 3 Encounters:  10/25/18 26.9 kg  10/09/18 61.4 kg  05/03/17 64.1 kg     Intake/Output Summary (Last 24 hours) at 10/27/2018 1208 Last data filed at 10/27/2018 0300 Gross per 24 hour  Intake 668.47 ml  Output 500 ml  Net 168.47 ml   Physical exam Not in distress HEENT: Rash over the face, moist mucosa Chest: Clear bilaterally CVs: Normal S1 and S2 Musculoskeletal: Warm, no edema, diffuse rash over the trunk and extremity (unchanged from yesterday) CNS: Alert and oriented x2       Data Review:    CBC Recent Labs  Lab 10/24/18 2256 10/25/18 0516 10/26/18 0432 10/27/18 0506  WBC 20.5* 16.4* 17.8* 15.4*  HGB 11.8* 10.8* 9.9* 10.0*  HCT 37.1 33.5* 31.9* 33.0*  PLT 282 252 245 239  MCV 103.1* 100.6* 103.6* 107.1*  MCH 32.8 32.4 32.1 32.5  MCHC 31.8 32.2 31.0 30.3  RDW 13.5 13.4 13.4 13.5  LYMPHSABS  --  0.8  --   --   MONOABS  --  0.1  --   --   EOSABS  --  0.6*  --   --   BASOSABS  --  0.0  --   --     Chemistries  Recent Labs  Lab 10/24/18 2256 10/25/18 0009 10/25/18 0516 10/26/18 0432 10/27/18 0506  NA 140  --  142 143 144  K 3.6  --  3.8 4.2 4.7  CL 109  --  113* 119* 119*  CO2 20*  --  19* 17* 17*  GLUCOSE 117*  --  130* 138* 117*  BUN 75*  --  70*  64* 47*  CREATININE 1.68* 1.70* 1.54* 1.14* 0.90  CALCIUM 9.5  --  9.1 8.8* 9.4  AST 39  --  34  --   --   ALT 30  --  29  --   --   ALKPHOS 148*  --  138*  --   --   BILITOT 0.7  --  0.6  --   --    ------------------------------------------------------------------------------------------------------------------ No results for input(s): CHOL, HDL, LDLCALC, TRIG, CHOLHDL, LDLDIRECT in the last 72 hours.  No results found for:  HGBA1C ------------------------------------------------------------------------------------------------------------------ Recent Labs    10/27/18 0506  TSH 0.954   ------------------------------------------------------------------------------------------------------------------ No results for input(s): VITAMINB12, FOLATE, FERRITIN, TIBC, IRON, RETICCTPCT in the last 72 hours.  Coagulation profile No results for input(s): INR, PROTIME in the last 168 hours.  No results for input(s): DDIMER in the last 72 hours.  Cardiac Enzymes No results for input(s): CKMB, TROPONINI, MYOGLOBIN in the last 168 hours.  Invalid input(s): CK ------------------------------------------------------------------------------------------------------------------    Component Value Date/Time   BNP 109.8 (H) 10/01/2018 1700    Inpatient Medications  Scheduled Meds: . aspirin EC  81 mg Oral Daily  . atorvastatin  40 mg Oral QHS  . diltiazem  30 mg Oral Q12H  . feeding supplement (PRO-STAT SUGAR FREE 64)  30 mL Oral BID  . furosemide  20 mg Oral Daily  . heparin  5,000 Units Subcutaneous Q12H  . hydrocortisone cream   Topical TID  . methylPREDNISolone (SOLU-MEDROL) injection  40 mg Intravenous Daily  . metoprolol tartrate  50 mg Oral BID  . pantoprazole  40 mg Oral BID  . potassium chloride SA  20 mEq Oral Daily   Continuous Infusions: . cefTRIAXone (ROCEPHIN)  IV 1 g (10/27/18 0817)   PRN Meds:.acetaminophen **OR** acetaminophen, diphenhydrAMINE, haloperidol lactate, magic mouthwash w/lidocaine, nitroGLYCERIN, phenol, polyethylene glycol  Micro Results Recent Results (from the past 240 hour(s))  MRSA PCR Screening     Status: None   Collection Time: 10/25/18  2:18 AM  Result Value Ref Range Status   MRSA by PCR NEGATIVE NEGATIVE Final    Comment:        The GeneXpert MRSA Assay (FDA approved for NASAL specimens only), is one component of a comprehensive MRSA colonization surveillance  program. It is not intended to diagnose MRSA infection nor to guide or monitor treatment for MRSA infections. Performed at [facility], [place] [address], [place], [place] [address]   Urine Culture     Status: None   Collection Time: 10/25/18  3:08 AM  Result Value Ref Range Status   Specimen Description   Final    URINE, RANDOM Performed at [facility] [address], [place], [place] [address]    Special Requests   Final    NONE Performed at [facility], [place] [address] [place], [place] [address]    Culture   Final    Multiple bacterial morphotypes present, none predominant. Suggest appropriate recollection if clinically indicated.   Report Status 10/26/2018 FINAL  Final  Culture, blood (routine x 2)     Status: None (Preliminary result)   Collection Time: 10/25/18  3:58 PM  Result Value Ref Range Status   Specimen Description   Final    BLOOD LEFT ARM Performed at [facility] [address], [place], [place] [address]    Special Requests   Final    BOTTLES DRAWN AEROBIC AND ANAEROBIC Blood Culture adequate volume Performed  at [facility], [place] [address], [place], [place] [address]    Culture   Final    NO GROWTH < 24 HOURS Performed at [facility] [address] [place], [place] [address]    Report Status PENDING  Incomplete  Culture, blood (routine x 2)     Status: None (Preliminary result)   Collection Time: 10/25/18  4:06 PM  Result Value Ref Range Status   Specimen Description   Final    BLOOD LEFT ARM Performed at [facility] [address] [place], [place] [address]    Special Requests   Final    BOTTLES DRAWN AEROBIC AND ANAEROBIC Blood Culture adequate volume Performed at [facility] [address], [place], [place] [address]    Culture   Final    NO GROWTH < 24 HOURS Performed at [facility] [address], [place],  [address]    Report Status PENDING  Incomplete    Radiology Reports Dg Chest 2 View  Result Date: 10/24/2018 CLINICAL DATA:  84 y/o  F; progressive rash all over the body. EXAM: CHEST - 2 VIEW COMPARISON:  10/01/2018 chest radiograph. 10/04/2018 esophagram. FINDINGS: Stable cardiac silhouette given projection and technique. Stable small right-sided pleural effusion and basilar opacity. No new consolidation. No pneumothorax. Stable hiatal hernia. No acute osseous abnormality is evident. IMPRESSION: Stable small right-sided pleural effusion and basilar opacity. Stable hiatal hernia. No new consolidation. Electronically Signed   By: Kristine Garbe M.D.   On: 10/24/2018 22:01   Dg Chest 2 View  Result Date: 10/01/2018 CLINICAL DATA:  84 y/o F; 4-5 days of weakness. Shortness of breath, cough, rales. EXAM: CHEST - 2 VIEW COMPARISON:  10/31/2016 chest radiograph. FINDINGS: Stable mildly enlarged cardiac silhouette given projection and technique. Small right pleural effusion. Right lung base opacification. Left lung base platelike atelectasis. No pneumothorax. Multilevel degenerative changes of the spine. No acute osseous abnormality is evident. IMPRESSION: Small right pleural effusion and basilar opacity which may represent associated atelectasis or pneumonia. Electronically Signed   By: Kristine Garbe M.D.   On: 10/01/2018 19:25   Ct Head Wo Contrast  Result Date: 10/24/2018 CLINICAL DATA:  Acute onset of altered mental status. Confusion and hallucinations. Found down. EXAM: CT HEAD WITHOUT CONTRAST TECHNIQUE: Contiguous axial images were obtained from the base of the skull through the vertex without intravenous contrast. COMPARISON:  None. FINDINGS: Brain: No evidence of acute infarction, hemorrhage, hydrocephalus, extra-axial collection or mass lesion / mass effect. Evaluation is somewhat suboptimal due to motion artifact at the vertex. Prominence of  the ventricles and sulci reflects mild cortical volume loss. Scattered periventricular and subcortical white matter change likely reflects small vessel ischemic microangiopathy. The brainstem and fourth ventricle are within normal limits. The basal ganglia are unremarkable in appearance. The cerebral hemispheres demonstrate grossly normal gray-white differentiation. No mass effect or midline shift is seen. Vascular: No hyperdense vessel or unexpected calcification. Skull: There is no evidence of fracture; visualized osseous structures are unremarkable in appearance. Sinuses/Orbits: The orbits are within normal limits. The paranasal sinuses and mastoid air cells are well-aerated. Other: No significant soft tissue abnormalities are seen. IMPRESSION: 1. No acute intracranial pathology seen on CT. 2. Mild cortical volume loss and scattered small vessel ischemic microangiopathy. Electronically Signed   By: Garald Balding M.D.   On: 10/24/2018 22:24   Ct Angio Chest Pe W Or Wo Contrast  Result Date: 10/02/2018 CLINICAL DATA:  84 year old with bilateral leg edema and fatigue. Elevated D-dimer. EXAM: CT ANGIOGRAPHY CHEST WITH CONTRAST TECHNIQUE: Multidetector CT imaging of the chest was performed using the standard protocol during bolus administration of intravenous contrast. Multiplanar CT image reconstructions and MIPs were obtained to evaluate the vascular anatomy. CONTRAST:  123mL ISOVUE-370 IOPAMIDOL (ISOVUE-370) INJECTION 76% COMPARISON:  Chest radiograph earlier this day. FINDINGS: Cardiovascular: There are no filling defects within the pulmonary arteries to suggest pulmonary embolus. Aortic atherosclerosis and tortuosity without dissection. Conventional branching pattern from the aortic arch. Mild multi chamber cardiomegaly. There are coronary artery calcifications. No significant pericardial effusion. Mediastinum/Nodes: Multiple small epicardial nodes, nonspecific. No enlarged mediastinal or hilar lymph nodes.  Small to moderate hiatal hernia. Upper esophagus is patulous. 3 cm right thyroid or parathyroid nodule. Enlargement of the right thyroid lobe compared to left. Lungs/Pleura: Moderate right pleural effusion is partially loculated. Associated compressive atelectasis in the right lower and middle lobes. There is a small left pleural effusion with adjacent atelectasis. Vague ground-glass opacities with minimal septal thickening at the apices. No suspicious pulmonary mass. Upper Abdomen: Upper abdominal ascites adjacent to the liver and spleen. Musculoskeletal: Lucency in the upper sternal body with surrounding calcification, suggesting subacute fracture. Degenerative change in the spine. Review of the MIP images confirms the above findings. IMPRESSION: 1. No pulmonary embolus. 2. Moderate right pleural effusion, partially loculated. There is adjacent compressive atelectasis. Small left pleural effusion. 3. Minimal ground-glass opacities and septal thickening at the apices, possible early pulmonary edema. Multi chamber cardiomegaly. 4. Ascites in the upper abdomen. 5. Sternal lucency with surrounding calcifications, likely subacute or remote fracture. Focal lytic bone lesion is not excluded on imaging findings alone, but felt less likely particularly in the absence of known malignancy. 6. A 3 cm hypodense nodule abutting the right lobe of the thyroid gland may be an exophytic thyroid nodule or parathyroid nodule. Thyroid ultrasound may be of value, giving consideration for patient's advanced age. 7. Aortic Atherosclerosis (ICD10-I70.0). Coronary artery calcifications. Electronically Signed   By: Keith Rake M.D.   On: 10/02/2018 05:51   Dg Esophagus  Result Date: 10/04/2018 CLINICAL DATA:  Difficulty swallowing. EXAM: ESOPHOGRAM/BARIUM SWALLOW TECHNIQUE: Single contrast examination was performed using thin barium. A barium tablet was also administered FLUOROSCOPY TIME:  Fluoroscopy Time:  2.9 minutes Radiation  Exposure Index (if provided by the fluoroscopic device): 55.9 mGy Number of Acquired Spot Images: 3 COMPARISON:  None. FINDINGS: The proximal and mid esophagus are widely patent. No stricture or mass. The motility of the esophagus is abnormal with multiple disorganized tertiary waves. A large hiatal hernia is identified. This contains approximately 20% intrathoracic stomach. Transient narrowing of the distal esophagus at the level of the GE junction noted which resulted and attenuation of tablet transit into the intrathoracic portions of the stomach. After several swallows of thin barium and water the tablet eventually passed into the stomach. Mild reflux identified. IMPRESSION: 1. Large hiatal hernia. 2. Esophageal dysmotility. Electronically Signed   By: Kerby Moors M.D.   On: 10/04/2018 15:31   US Abdomen Limited  Result Date: 10/02/2018 CLINICAL DATA:  Abdominal distension EXAM: LIMITED ABDOMEN ULTRASOUND FOR ASCITES TECHNIQUE: Limited ultrasound survey for ascites was performed in all four abdominal quadrants. COMPARISON:  10/02/2018 FINDINGS: Survey of the abdominal 4 quadrants demonstrates a small to moderate amount of diffuse ascites. Right pleural effusion also noted. IMPRESSION: Small to moderate abdominopelvic ascites Right pleural effusion Electronically Signed   By: Jerilynn Mages.  Shick M.D.   On: 10/02/2018 15:14  Time Spent in minutes  25   Elora Wolter M.D on 10/27/2018 at 12:08 PM  Between 7am to 7pm - Pager - [phone]  After 7pm go to [email or web address] - password [facility]  [facility] -  Office  [phone]

## 2018-10-28 LAB — CBC
HEMATOCRIT: 33.8 % — AB (ref 36.0–46.0)
Hemoglobin: 10.7 g/dL — ABNORMAL LOW (ref 12.0–15.0)
MCH: 32.9 pg (ref 26.0–34.0)
MCHC: 31.7 g/dL (ref 30.0–36.0)
MCV: 104 fL — AB (ref 80.0–100.0)
NRBC: 0 % (ref 0.0–0.2)
Platelets: 244 10*3/uL (ref 150–400)
RBC: 3.25 MIL/uL — AB (ref 3.87–5.11)
RDW: 13.3 % (ref 11.5–15.5)
WBC: 16 10*3/uL — ABNORMAL HIGH (ref 4.0–10.5)

## 2018-10-28 MED ORDER — PREDNISONE 20 MG PO TABS
40.0000 mg | ORAL_TABLET | Freq: Every day | ORAL | Status: DC
  Administered 2018-10-28 – 2018-10-30 (×3): 40 mg via ORAL
  Filled 2018-10-28 (×3): qty 2

## 2018-10-28 NOTE — Progress Notes (Signed)
This encounter was created in error - please disregard.

## 2018-10-28 NOTE — Progress Notes (Addendum)
CSW consulted to assist with disposition- pt and family known to [place] from recent hospitalization- assisted with DC to [facility] for rehab. Met with pt at bedside and daughter via phone this morning- at this point feel ptlikely needing long term skilled nursing care rather than anticipating her being able to return to prior functioning and return to her independent living home at [facility]. Daughter explains pt's dementia is worsening and she "sundowns and will not do much with the therapists anymore. Stays incontinent and doesn't want to get up and around because of that."  Daughter has been researching SNFs for long term care and is open to several options and prepared for private pay if/when [facility] Medicare no longer authorizing rehab. Will complete FL2 and referrals and follow up with bed offers.  Sharren Bridge, MSW, LCSW Clinical Social Work 10/28/2018 [phone]  See complete assessment below from last hospitalization for some history.   Clinical Social Work Assessment  Patient Details  Name: Rebecca Ford MRN: [id number] Date of Birth: [birth year]  Date of referral:  10/08/18               Reason for consult:  Facility Placement                 Permission sought to share information with:  Family Supports Permission granted to share information::  Yes, Verbal Permission Granted             Name::     daughter Abigail Butts             Agency::                Relationship::                Contact Information:     Housing/Transportation Living arrangements for the past 2 months:  Charity fundraiser of Information:  Patient, Adult Children Patient Interpreter Needed:  None Criminal Activity/Legal Involvement Pertinent to Current Situation/Hospitalization:  No - Comment as needed Significant Relationships:  Adult Children Lives with:  Self Do you feel safe going back to the place where you live?  No Need for family participation in patient care:  Yes  (Comment)(daughter)  Care giving concerns:  Pt admitted from independent living apartment [facility]). Uses rolling walker at baseline. Uses facility transportation to leave home for meals. Independent with ADLs. Admitted with exacerbated CHF. Also reports issues eating/swallowing.   Social Worker assessment / plan:  CSW consulted for assistance with DC planning- SNF recommended as pt's ambulating needs are increasing per daughter- medical team in agreement. Met with pt and daughter at bedside- both report being very familiar with SNF rehab and placement process as pt went to [facility] 2 years ago after fracture.  Both pt and daughter desire pt to go to rehab again, however daughter expressing concern that pt's if "her eating doesn't improve she won't be able to rehab anywhere." States she is requesting more information about what to expect for pt's oral intake with provider today. CSW made referrals and provided daughter with bed offers- pt defers to her for decision. Initiated [employer] Bernadene Bell) authorization request 10/07/18- April with Bernadene Bell requested pt's selection of facility in order to proceed with auth request.  Plan: SNF for rehab at [place]- awaiting facility selection and insurance authorization  Employment status:  Retired Insurance underwriter information:  Arts development officer) PT Recommendations:  [place] [place] / Referral to community resources:  [place]  Patient/Family's Response to care:  Appreciative but daughter reports being" frustrated feeling that pt's difficulty swallowing has not been resolved"  Patient/Family's Understanding of and Emotional Response to Diagnosis, Current Treatment, and Prognosis:  See above- shows adequate understanding, emotionally positive but concerned  Emotional Assessment Appearance:  Appears stated age Attitude/Demeanor/Rapport:  Engaged Affect (typically  observed):  Accepting, Calm Orientation:  Oriented to Self, Oriented to Place, Oriented to  Time, Oriented to Situation Alcohol / Substance use:  Not Applicable Psych involvement (Current and /or in the community):  No (Comment)  Discharge Needs  Concerns to be addressed:  Discharge Planning Concerns Readmission within the last 30 days:  No Current discharge risk:  Dependent with Mobility Barriers to Discharge:  Continued Medical Work up   [employer], LCSW 10/08/2018, 11:01 AM  [phone]

## 2018-10-28 NOTE — Care Management Important Message (Signed)
Important Message  Patient Details  Name: Rebecca Ford MRN: [id number] Date of Birth: [birth year]   Medicare Important Message Given:  Yes    Kerin Salen 10/28/2018, 12:42 [place] Message  Patient Details  Name: Rebecca Ford MRN: [id number] Date of Birth: [birth year]   Medicare Important Message Given:  Yes    Kerin Salen 10/28/2018, 12:42 PM

## 2018-10-28 NOTE — NC FL2 (Signed)
[place] LEVEL OF CARE SCREENING TOOL     IDENTIFICATION  Patient Name: Rebecca Ford Birthdate: 1930-03-08 Sex: female Admission Date (Current Location): 10/24/2018  [facility] and [place] Number:  Herbalist and Address:  [facility],  [place] [address] [place]      Provider Number: [id number]  Attending Physician Name and Address:  Louellen Molder, MD  Relative Name and Phone Number:  daughter Abigail Butts [phone]    Current Level of Care: Hospital Recommended Level of Care: [place] Prior Approval Number:    Date Approved/Denied:   PASRR Number: [id number]   Discharge Plan: SNF    Current Diagnoses: Patient Active Problem List   Diagnosis Date Noted  . Acute renal failure (ARF) ([place]) 10/25/2018  . Rash and nonspecific skin eruption 10/25/2018  . Confusion 10/25/2018  . CHF (congestive heart failure) ([place]) 10/02/2018  . Acute CHF (congestive heart failure) ([place]) 10/01/2018  . Macrocytic anemia 10/01/2018  . Chronic depression 12/03/2016  . Localized osteoarthritis of right knee 11/13/2016  . Primary osteoarthritis of right knee 112-04-2016  . Syncope 11/16/2015  . 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 HYPERTENSION, BENIGN 09/20/2010  . CAD, NATIVE VESSEL 09/20/2010    Orientation RESPIRATION BLADDER Height & Weight     Self, Time, Place  O2(2L) Incontinent, External catheter Weight: 59 lb 6.4 oz (26.9 kg) Height:  5\' 1"  (154.9 cm)  BEHAVIORAL SYMPTOMS/MOOD NEUROLOGICAL BOWEL NUTRITION STATUS      Continent Diet(soft diet, thin fluid consistency)  AMBULATORY STATUS COMMUNICATION OF NEEDS Skin   Extensive Assist Verbally Normal                       Personal Care Assistance Level of Assistance  Bathing, Feeding, Dressing Bathing Assistance: Maximum assistance Feeding assistance: Independent Dressing Assistance: Limited assistance     Functional Limitations Info  Sight, Hearing,  Speech Sight Info: Adequate Hearing Info: Adequate Speech Info: Adequate    SPECIAL CARE FACTORS FREQUENCY  PT (By licensed PT), OT (By licensed OT)     PT Frequency: 3x OT Frequency: 3x            Contractures Contractures Info: Not present    Additional Factors Info  Code Status, Allergies Code Status Info: DNR Allergies Info: codeine           Current Medications (10/28/2018):  This is the current hospital active medication list Current Facility-Administered Medications  Medication Dose Route Frequency Provider Last Rate Last Dose  . acetaminophen (TYLENOL) tablet 650 mg  650 mg Oral Q6H PRN Matcha, Anupama, MD       Or  . acetaminophen (TYLENOL) suppository 650 mg  650 mg Rectal Q6H PRN Matcha, Anupama, MD      . aspirin EC tablet 81 mg  81 mg Oral Daily Matcha, Anupama, MD   81 mg at 10/28/18 0959  . atorvastatin (LIPITOR) tablet 40 mg  40 mg Oral QHS Matcha, Anupama, MD   40 mg at 10/27/18 2111  . cefTRIAXone (ROCEPHIN) 1 g in sodium chloride 0.9 % 100 mL IVPB  1 g Intravenous Q24H Dhungel, Nishant, MD 200 mL/hr at 10/28/18 0734 1 g at 10/28/18 0734  . diltiazem (CARDIZEM) tablet 30 mg  30 mg Oral Q12H Matcha, Anupama, MD   30 mg at 10/28/18 0958  . diphenhydrAMINE (BENADRYL) 12.5 MG/5ML elixir 12.5 mg  12.5 mg Oral Q8H PRN Wofford, Drew A, RPH   12.5 mg at  10/28/18 0019  . feeding supplement (PRO-STAT SUGAR FREE 64) liquid 30 mL  30 mL Oral BID Matcha, Anupama, MD   30 mL at 10/28/18 0957  . furosemide (LASIX) tablet 20 mg  20 mg Oral Daily Dhungel, Nishant, MD   20 mg at 10/28/18 0958  . haloperidol lactate (HALDOL) injection 0.5 mg  0.5 mg Intravenous Q6H PRN Dhungel, Nishant, MD   0.5 mg at 10/25/18 1111  . heparin injection 5,000 Units  5,000 Units Subcutaneous Q12H Matcha, Anupama, MD   5,000 Units at 10/28/18 0958  . hydrocortisone cream 1 %   Topical TID Dhungel, Nishant, MD      . magic mouthwash w/lidocaine  5 mL Oral TID PRN Dhungel, Nishant, MD   5 mL at  10/27/18 0552  . metoprolol tartrate (LOPRESSOR) tablet 50 mg  50 mg Oral BID Matcha, Anupama, MD   50 mg at 10/28/18 0958  . nitroGLYCERIN (NITROSTAT) SL tablet 0.4 mg  0.4 mg Sublingual Q5 min PRN Matcha, Anupama, MD      . pantoprazole (PROTONIX) EC tablet 40 mg  40 mg Oral BID Matcha, Anupama, MD   40 mg at 10/28/18 0958  . phenol (CHLORASEPTIC) mouth spray 1 spray  1 spray Mouth/Throat TID PRN Yaakov Guthrie, MD   1 spray at 10/25/18 0621  . polyethylene glycol (MIRALAX / GLYCOLAX) packet 17 g  17 g Oral Daily PRN Matcha, Anupama, MD      . predniSONE (DELTASONE) tablet 40 mg  40 mg Oral Q breakfast Dhungel, Nishant, MD   40 mg at 10/28/18 0940     Discharge Medications: Please see discharge summary for a list of discharge medications.  Relevant Imaging Results:  Relevant Lab Results:   Additional Information SSN: [id number]  Nila Nephew, LCSW

## 2018-10-28 NOTE — Progress Notes (Signed)
PROGRESS NOTE                                                                                                                                                                                                             Patient Demographics:    Rebecca Ford, is a 84 y.o. female, DOB - March 11, 1930, ZOX:[id number]  Admit date - 10/24/2018   Admitting Physician [name], MD  Outpatient Primary MD for the patient is [name], MD  LOS - 3  Outpatient Specialists : none  Chief Complaint  Patient presents with  . Allergic Reaction       Brief Narrative   84 year old female with coronary artery disease, hypertension, dyslipidemia, recent hospitalization for decompensated diastolic CHF, failure to thrive was discharged to SNF brought to the ED with increasing confusion, diffuse macular rash.  Per daughter who provided history she does have at least moderate dementia which was not diagnosed until her recent hospitalization.  She has been more forgetful in the recent past.  She was discharged to SNF on Megace recently.  For past 2 days she has noticed diffuse rash in her body with severe itching.  Also complaining of some difficulty swallowing.  No fevers, chills, nausea, vomiting, abdominal pain, shortness of breath, dysuria or diarrhea.  Patient is incontinent and wears diapers. In the ED blood work showed acute kidney injury and leukocytosis.  Admitted for further management.   Subjective:   Seems more oriented to me from previous days.  Still having some itching but better.  Patient more conversant and asking if she can go back to the facility and get skilled care there.  Also asking me what the cost of skilled care would be.  Assessment  & Plan :   Diffuse macular skin rash Suspect drug reaction.  Was recently started on Cardizem and Megace.  Could be contributed by medications.  Discontinued Megace and placed on IV  Solu-Medrol, PRN IV Benadryl for itching.  No clinical signs of infection except for possible UTI.  Blood and urine cultures negative. Transition to oral prednisone today.  Active problems Acute metabolic encephalopathy Possibly worsening of her dementia in the setting of infection (UTI) and dehydration.  Avoiding narcotics and benzos.  discontinued amitriptyline and Claritin.  Low-dose PRN Haldol for sundowning.  Head CT negative for acute findings.  Chest  x-ray negative for infiltrate.   Encephalopathy has improved since admission.  Acute kidney injury Suspect prerenal with dehydration.  Possible UTI as well.  Improved with hydration.  Resume Lasix and fluid discontinued.  HCTZ and Micardis still on hold.  Will resume upon discharge.    UTI On empiric Rocephin.  Cultures negative.  Completed 5-day course.  Leukocytosis Reactive versus secondary to UTI.  Slowly improving a.m. labs.  Essential hypertension On Cardizem and metoprolol.  Chronic diastolic CHF Recent hospitalization for decompensation 2 weeks back with right pleural effusion.  Iron deficiency anemia Received IV iron during last hospitalization.  Severe protein calorie malnutrition On supplement.  Megace held.  Generalized weakness Seen by PT/OT and recommend SNF as patient needs a lot of skilled care and daughter unable to take care of her at home.  Code Status : DNR  Family Communication  :  will update daughter.  Disposition Plan  : SNF if rash continues to improve.  Barriers For Discharge : Active symptoms  Consults  : None  Procedures  : Head CT  DVT Prophylaxis  : Subcu heparin  Lab Results  Component Value Date   PLT 244 10/28/2018    Antibiotics  :   Anti-infectives (From admission, onward)   Start     Dose/Rate Route Frequency Ordered Stop   10/25/18 0800  cefTRIAXone (ROCEPHIN) 1 g in sodium chloride 0.9 % 100 mL IVPB     1 g 200 mL/hr over 30 Minutes Intravenous Every 24 hours 10/25/18  0712          Objective:   Vitals:   10/27/18 2143 10/27/18 2214 10/28/18 0513 10/28/18 0957  BP: (!) 181/79 (!) 146/69 (!) 164/77 (!) 165/70  Pulse: 91 83 93   Resp: 14 16 16    Temp: 97.6 F (36.4 C)  98.3 F (36.8 C)   TempSrc: Oral  Oral   SpO2: (!) 84% 100% 98%   Weight:      Height:        Wt Readings from Last 3 Encounters:  10/25/18 26.9 kg  10/09/18 61.4 kg  05/03/17 64.1 kg     Intake/Output Summary (Last 24 hours) at 10/28/2018 1303 Last data filed at 10/28/2018 0200 Gross per 24 hour  Intake 820 ml  Output 800 ml  Net 20 ml   Physical exam  Physical exam Elderly female not in distress HEENT: Rash over the face slightly improved from yesterday, moist mucosa Chest: Clear bilaterally CVS normal S1 and S2 GI: Soft, nondistended, nontender Muscular skeletal: Rash over the trunk and extremities (some improvement from yesterday) CNS: Alert and oriented x2-3 (better from yesterday)       Data Review:    CBC Recent Labs  Lab 10/24/18 2256 10/25/18 0516 10/26/18 0432 10/27/18 0506 10/28/18 0505  WBC 20.5* 16.4* 17.8* 15.4* 16.0*  HGB 11.8* 10.8* 9.9* 10.0* 10.7*  HCT 37.1 33.5* 31.9* 33.0* 33.8*  PLT 282 252 245 239 244  MCV 103.1* 100.6* 103.6* 107.1* 104.0*  MCH 32.8 32.4 32.1 32.5 32.9  MCHC 31.8 32.2 31.0 30.3 31.7  RDW 13.5 13.4 13.4 13.5 13.3  LYMPHSABS  --  0.8  --   --   --   MONOABS  --  0.1  --   --   --   EOSABS  --  0.6*  --   --   --   BASOSABS  --  0.0  --   --   --     Chemistries  Recent Labs  Lab 10/24/18 2256 10/25/18 0009 10/25/18 0516 10/26/18 0432 10/27/18 0506  NA 140  --  142 143 144  K 3.6  --  3.8 4.2 4.7  CL 109  --  113* 119* 119*  CO2 20*  --  19* 17* 17*  GLUCOSE 117*  --  130* 138* 117*  BUN 75*  --  70* 64* 47*  CREATININE 1.68* 1.70* 1.54* 1.14* 0.90  CALCIUM 9.5  --  9.1 8.8* 9.4  AST 39  --  34  --   --   ALT 30  --  29  --   --   ALKPHOS 148*  --  138*  --   --   BILITOT 0.7  --  0.6  --    --    ------------------------------------------------------------------------------------------------------------------ No results for input(s): CHOL, HDL, LDLCALC, TRIG, CHOLHDL, LDLDIRECT in the last 72 hours.  No results found for: HGBA1C ------------------------------------------------------------------------------------------------------------------ Recent Labs    10/27/18 0506  TSH 0.954   ------------------------------------------------------------------------------------------------------------------ No results for input(s): VITAMINB12, FOLATE, FERRITIN, TIBC, IRON, RETICCTPCT in the last 72 hours.  Coagulation profile No results for input(s): INR, PROTIME in the last 168 hours.  No results for input(s): DDIMER in the last 72 hours.  Cardiac Enzymes No results for input(s): CKMB, TROPONINI, MYOGLOBIN in the last 168 hours.  Invalid input(s): CK ------------------------------------------------------------------------------------------------------------------    Component Value Date/Time   BNP 109.8 (H) 10/01/2018 1700    Inpatient Medications  Scheduled Meds: . aspirin EC  81 mg Oral Daily  . atorvastatin  40 mg Oral QHS  . diltiazem  30 mg Oral Q12H  . feeding supplement (PRO-STAT SUGAR FREE 64)  30 mL Oral BID  . furosemide  20 mg Oral Daily  . heparin  5,000 Units Subcutaneous Q12H  . hydrocortisone cream   Topical TID  . metoprolol tartrate  50 mg Oral BID  . pantoprazole  40 mg Oral BID  . predniSONE  40 mg Oral Q breakfast   Continuous Infusions: . cefTRIAXone (ROCEPHIN)  IV 1 g (10/28/18 0734)   PRN Meds:.acetaminophen **OR** acetaminophen, diphenhydrAMINE, haloperidol lactate, magic mouthwash w/lidocaine, nitroGLYCERIN, phenol, polyethylene glycol  Micro Results Recent Results (from the past 240 hour(s))  MRSA PCR Screening     Status: None   Collection Time: 10/25/18  2:18 AM  Result Value Ref Range Status   MRSA by PCR NEGATIVE NEGATIVE Final     Comment:        The GeneXpert MRSA Assay (FDA approved for NASAL specimens only), is one component of a comprehensive MRSA colonization surveillance program. It is not intended to diagnose MRSA infection nor to guide or monitor treatment for MRSA infections. Performed at [facility], [place] [address] [place], [place] [address]   Urine Culture     Status: None   Collection Time: 10/25/18  3:08 AM  Result Value Ref Range Status   Specimen Description   Final    URINE, RANDOM Performed at [place] [address], [place], [place] [address]    Special Requests   Final    NONE Performed at [facility], [place] [address] [place], [place] [address]    Culture   Final    Multiple bacterial morphotypes present, none predominant. Suggest appropriate recollection if clinically indicated.   Report Status 10/26/2018 FINAL  Final  Culture, blood (routine x 2)     Status: None (Preliminary result)   Collection Time: 10/25/18  3:58 PM  Result Value Ref Range Status   Specimen Description   Final    BLOOD LEFT ARM Performed at [facility] [address], [place], [place] [address]    Special Requests   Final    BOTTLES DRAWN AEROBIC AND ANAEROBIC Blood Culture adequate volume Performed at [place] [address] [place], [place] [address]    Culture   Final    NO GROWTH 3 DAYS Performed at [facility], [place] [address], [place], [place] [address]    Report Status PENDING  Incomplete  Culture, blood (routine x 2)     Status: None (Preliminary result)   Collection Time: 10/25/18  4:06 PM  Result Value Ref Range Status   Specimen Description   Final    BLOOD LEFT ARM Performed at [place] [place] [address] [place], [place] [address]    Special Requests   Final    BOTTLES DRAWN AEROBIC AND ANAEROBIC Blood Culture adequate volume Performed at [facility] [address] [place], [place] [address]    Culture   Final    NO GROWTH 3 DAYS Performed at [facility], [place] [address], [place], [place] [address]    Report Status PENDING  Incomplete    Radiology Reports Dg Chest 2 View  Result Date: 10/24/2018 CLINICAL DATA:  84 y/o  F; progressive rash all over the body. EXAM: CHEST - 2 VIEW COMPARISON:  10/01/2018 chest radiograph. 10/04/2018 esophagram. FINDINGS: Stable cardiac silhouette given projection and technique. Stable small right-sided pleural effusion and basilar opacity. No new consolidation. No pneumothorax. Stable hiatal hernia. No acute osseous abnormality is evident. IMPRESSION: Stable small right-sided pleural effusion and basilar opacity. Stable hiatal hernia. No new consolidation. Electronically Signed   By: Kristine Garbe M.D.   On: 10/24/2018 22:01   Dg Chest 2 View  Result Date: 10/01/2018 CLINICAL DATA:  84 y/o F; 4-5 days of weakness. Shortness of breath, cough, rales. EXAM: CHEST - 2 VIEW COMPARISON:  10/31/2016 chest radiograph. FINDINGS: Stable mildly enlarged cardiac silhouette given projection and technique. Small right pleural effusion. Right lung base opacification. Left lung base platelike atelectasis. No pneumothorax. Multilevel degenerative changes of the spine. No acute osseous abnormality is evident. IMPRESSION: Small right pleural effusion and basilar opacity which may represent associated atelectasis or pneumonia. Electronically Signed   By: Kristine Garbe M.D.   On: 10/01/2018 19:25   Ct Head Wo Contrast  Result Date: 10/24/2018 CLINICAL DATA:  Acute onset of altered mental status. Confusion and hallucinations. Found down. EXAM: CT HEAD WITHOUT CONTRAST TECHNIQUE: Contiguous axial images were obtained from the base of the skull through the vertex without intravenous contrast. COMPARISON:  None. FINDINGS: Brain: No evidence of acute infarction, hemorrhage,  hydrocephalus, extra-axial collection or mass lesion / mass effect. Evaluation is somewhat suboptimal due to motion artifact at the vertex. Prominence of the ventricles and sulci reflects mild cortical volume loss. Scattered periventricular and subcortical white matter change likely reflects small vessel ischemic microangiopathy. The brainstem and fourth ventricle are within normal limits. The basal ganglia are unremarkable in appearance. The cerebral hemispheres demonstrate grossly normal gray-white differentiation. No mass effect or midline shift is seen. Vascular: No hyperdense vessel or unexpected calcification. Skull: There is no evidence of fracture; visualized osseous structures are unremarkable in appearance. Sinuses/Orbits: The orbits are within normal limits. The paranasal sinuses and mastoid air cells are well-aerated. Other: No significant soft tissue  abnormalities are seen. IMPRESSION: 1. No acute intracranial pathology seen on CT. 2. Mild cortical volume loss and scattered small vessel ischemic microangiopathy. Electronically Signed   By: Garald Balding M.D.   On: 10/24/2018 22:24   Ct Angio Chest Pe W Or Wo Contrast  Result Date: 10/02/2018 CLINICAL DATA:  84 year old with bilateral leg edema and fatigue. Elevated D-dimer. EXAM: CT ANGIOGRAPHY CHEST WITH CONTRAST TECHNIQUE: Multidetector CT imaging of the chest was performed using the standard protocol during bolus administration of intravenous contrast. Multiplanar CT image reconstructions and MIPs were obtained to evaluate the vascular anatomy. CONTRAST:  141mL ISOVUE-370 IOPAMIDOL (ISOVUE-370) INJECTION 76% COMPARISON:  Chest radiograph earlier this day. FINDINGS: Cardiovascular: There are no filling defects within the pulmonary arteries to suggest pulmonary embolus. Aortic atherosclerosis and tortuosity without dissection. Conventional branching pattern from the aortic arch. Mild multi chamber cardiomegaly. There are coronary artery  calcifications. No significant pericardial effusion. Mediastinum/Nodes: Multiple small epicardial nodes, nonspecific. No enlarged mediastinal or hilar lymph nodes. Small to moderate hiatal hernia. Upper esophagus is patulous. 3 cm right thyroid or parathyroid nodule. Enlargement of the right thyroid lobe compared to left. Lungs/Pleura: Moderate right pleural effusion is partially loculated. Associated compressive atelectasis in the right lower and middle lobes. There is a small left pleural effusion with adjacent atelectasis. Vague ground-glass opacities with minimal septal thickening at the apices. No suspicious pulmonary mass. Upper Abdomen: Upper abdominal ascites adjacent to the liver and spleen. Musculoskeletal: Lucency in the upper sternal body with surrounding calcification, suggesting subacute fracture. Degenerative change in the spine. Review of the MIP images confirms the above findings. IMPRESSION: 1. No pulmonary embolus. 2. Moderate right pleural effusion, partially loculated. There is adjacent compressive atelectasis. Small left pleural effusion. 3. Minimal ground-glass opacities and septal thickening at the apices, possible early pulmonary edema. Multi chamber cardiomegaly. 4. Ascites in the upper abdomen. 5. Sternal lucency with surrounding calcifications, likely subacute or remote fracture. Focal lytic bone lesion is not excluded on imaging findings alone, but felt less likely particularly in the absence of known malignancy. 6. A 3 cm hypodense nodule abutting the right lobe of the thyroid gland may be an exophytic thyroid nodule or parathyroid nodule. Thyroid ultrasound may be of value, giving consideration for patient's advanced age. 7. Aortic Atherosclerosis (ICD10-I70.0). Coronary artery calcifications. Electronically Signed   By: Keith Rake M.D.   On: 10/02/2018 05:51   Dg Esophagus  Result Date: 10/04/2018 CLINICAL DATA:  Difficulty swallowing. EXAM: ESOPHOGRAM/BARIUM SWALLOW  TECHNIQUE: Single contrast examination was performed using thin barium. A barium tablet was also administered FLUOROSCOPY TIME:  Fluoroscopy Time:  2.9 minutes Radiation Exposure Index (if provided by the fluoroscopic device): 55.9 mGy Number of Acquired Spot Images: 3 COMPARISON:  None. FINDINGS: The proximal and mid esophagus are widely patent. No stricture or mass. The motility of the esophagus is abnormal with multiple disorganized tertiary waves. A large hiatal hernia is identified. This contains approximately 20% intrathoracic stomach. Transient narrowing of the distal esophagus at the level of the GE junction noted which resulted and attenuation of tablet transit into the intrathoracic portions of the stomach. After several swallows of thin barium and water the tablet eventually passed into the stomach. Mild reflux identified. IMPRESSION: 1. Large hiatal hernia. 2. Esophageal dysmotility. Electronically Signed   By: Kerby Moors M.D.   On: 10/04/2018 15:31   US Abdomen Limited  Result Date: 10/02/2018 CLINICAL DATA:  Abdominal distension EXAM: LIMITED ABDOMEN ULTRASOUND FOR ASCITES TECHNIQUE: Limited ultrasound survey for ascites  was performed in all four abdominal quadrants. COMPARISON:  10/02/2018 FINDINGS: Survey of the abdominal 4 quadrants demonstrates a small to moderate amount of diffuse ascites. Right pleural effusion also noted. IMPRESSION: Small to moderate abdominopelvic ascites Right pleural effusion Electronically Signed   By: Jerilynn Mages.  Shick M.D.   On: 10/02/2018 15:14    Time Spent in minutes  25   Dorrien Grunder M.D on 10/28/2018 at 1:03 PM  Between 7am to 7pm - Pager - [phone]  After 7pm go to [email or web address] - password [facility]  [facility] -  Office  [phone]

## 2018-10-28 NOTE — Addendum Note (Signed)
Addended by: Darvin Neighbours on: 10/28/2018 11:02 AM   Modules accepted: Level of Service, SmartSet

## 2018-10-29 DIAGNOSIS — F0390 Unspecified dementia without behavioral disturbance: Secondary | ICD-10-CM

## 2018-10-29 DIAGNOSIS — F05 Delirium due to known physiological condition: Secondary | ICD-10-CM

## 2018-10-29 MED ORDER — HYDRALAZINE HCL 20 MG/ML IJ SOLN
5.0000 mg | Freq: Once | INTRAMUSCULAR | Status: AC
  Administered 2018-10-29: 5 mg via INTRAVENOUS
  Filled 2018-10-29: qty 1

## 2018-10-29 NOTE — Progress Notes (Signed)
Occupational Therapy Treatment Patient Details Name: Rebecca Ford MRN: [id number] DOB: [birth year] Today's Date: 10/29/2018    History of present illness 84 year old female with coronary artery disease, hypertension, dyslipidemia, OA, MI, cataracts and recent hospitalization for decompensated diastolic CHF, failure to thrive was discharged to SNF brought to the ED with increasing confusion, diffuse macular rash   OT comments  Pt agreeable to OOB. No family present  Follow Up Recommendations  SNF;Supervision/Assistance - 24 hour    Equipment Recommendations  Other (comment)(TBD at next venue of care)    Recommendations for Other Services PT consult    Precautions / Restrictions Precautions Precautions: Fall       Mobility Bed Mobility Overal bed mobility: Needs Assistance Bed Mobility: Supine to Sit;Sit to Supine     Supine to sit: HOB elevated;Min assist     General bed mobility comments: Increased time/effort to perform.  Transfers Overall transfer level: Needs assistance Equipment used: Rolling walker (2 wheeled) Transfers: Sit to/from [facility] Sit to Stand: Min assist Stand pivot transfers: Mod assist                ADL either performed or assessed with clinical judgement   ADL Overall ADL's : Needs assistance/impaired Eating/Feeding: Set up;Sitting   Grooming: Set up;Sitting;Brushing hair;Wash/dry face                   Toilet Transfer: Moderate assistance;BSC;Stand-pivot   Toileting- Clothing Manipulation and Hygiene: Maximal assistance;Sit to/from stand;Cueing for safety         General ADL Comments: pt agreeable to OOB and lunch in chair     Vision Baseline Vision/History: Wears glasses Wears Glasses: At all times Patient Visual Report: No change from baseline     Perception     Praxis      Cognition Arousal/Alertness: Awake/alert Behavior During Therapy: WFL for tasks assessed/performed Overall  Cognitive Status: No family/caregiver present to determine baseline cognitive functioning Area of Impairment: Memory;Safety/judgement;Following commands                 Orientation Level: Time;Disoriented to;Situation   Memory: Decreased short-term memory Following Commands: Follows one step commands inconsistently Safety/Judgement: Decreased awareness of safety;Decreased awareness of deficits                         Pertinent Vitals/ Pain       Pain Assessment: No/denies pain     Prior Functioning/Environment              Frequency  Min 2X/week        Progress Toward Goals  OT Goals(current goals can now be found in the care plan section)     Acute Rehab OT Goals Patient Stated Goal: none stated  OT Goal Formulation: Patient unable to participate in goal setting Time For Goal Achievement: 11/10/18 Potential to Achieve Goals: [facility] Discharge plan remains appropriate    Co-evaluation                 AM-PAC PT "6 Clicks" Daily Activity     Outcome Measure   Help from another person eating meals?: None Help from another person taking care of personal grooming?: A Little Help from another person toileting, which includes using toliet, bedpan, or urinal?: A Lot Help from another person bathing (including washing, rinsing, drying)?: A Lot Help from another person to put on and taking off regular upper body clothing?: A Lot Help from another  person to put on and taking off regular lower body clothing?: A Lot 6 Click Score: 15    End of Session Equipment Utilized During Treatment: Gait belt  OT Visit Diagnosis: Unsteadiness on feet (R26.81);Other symptoms and signs involving cognitive function;Muscle weakness (generalized) (M62.81)   Activity Tolerance Patient tolerated treatment well   Patient Left with call bell/phone within reach;with bed alarm set;in chair   Nurse Communication Mobility status        Time: 1320-1340 OT Time  Calculation (min): 20 min  Charges: OT General Charges $OT Visit: 1 Visit OT Treatments $Self Care/Home Management : 8-22 mins  Kari Baars, [place] [phone] Office- [phone], Edwena Felty D 10/29/2018, 4:38 PM

## 2018-10-29 NOTE — Consult Note (Addendum)
   [facility] CM Inpatient Consult   10/29/2018  SHANDIE BERTZ 1930/04/19 [id number]    Referral received from [facility] Management office to engage for potential [facility] Care Management services.  Chart reviewed. Spoke with inpatient LCSW to determine whether [facility] Management services are appropriate. Patient likely to discharge to a facility (please see inpatient LCSW notes for details). However, disposition plans are pending at this point.   Will continue to follow along and engage for [facility] Management services if appropriate.   Marthenia Rolling, MSN-Ed, RN,BSN [facility] Liaison [phone]

## 2018-10-29 NOTE — Progress Notes (Signed)
Pt has received bed offer at SNF- [facility], however today daughter hesitant re: SNF admission and discussing with pt's previous facility [facility] (where she resides in independent living) if pt's care could be managed in the memory care unit there. [facility] advises can do assessment with pt in hospital 10/30/18 a.m. Daughter does not want to proceed with SNF placement until memory care admission has been evaluated.  Sharren Bridge, MSW, LCSW Clinical Social Work 10/29/2018 [phone]

## 2018-10-29 NOTE — Progress Notes (Signed)
PROGRESS NOTE                                                                                                                                                                                                             Patient Demographics:    Rebecca Ford, is a 84 y.o. female, DOB - Oct 26, 1930, WRU:[id number]  Admit date - 10/24/2018   Admitting Physician [name], MD  Outpatient Primary MD for the patient is [name], MD  LOS - 4  Outpatient Specialists : none  Chief Complaint  Patient presents with  . Allergic Reaction       Brief Narrative   84 year old female with coronary artery disease, hypertension, dyslipidemia, recent hospitalization for decompensated diastolic CHF, failure to thrive was discharged to SNF brought to the ED with increasing confusion, diffuse macular rash.  Per daughter who provided history she does have at least moderate dementia which was not diagnosed until her recent hospitalization.  She has been more forgetful in the recent past.  She was discharged to SNF on Megace recently.  For past 2 days she has noticed diffuse rash in her body with severe itching.  Also complaining of some difficulty swallowing.  No fevers, chills, nausea, vomiting, abdominal pain, shortness of breath, dysuria or diarrhea.  Patient is incontinent and wears diapers. In the ED blood work showed acute kidney injury and leukocytosis.  Admitted for further management.   Subjective:   Episode of confusion but mentation has proved since admission.  Assessment  & Plan :   Diffuse macular skin rash Suspect drug reaction.  Was recently started on Cardizem and Megace.  Could be contributed by medications.  Discontinued Megace and placed on IV Solu-Medrol, PRN IV Benadryl for itching.  No clinical signs of infection except for possible UTI.  Blood and urine cultures negative. Rash has improved more than 70% since  admission.  Continue oral prednisone.  Active problems Acute metabolic encephalopathy Possibly worsening of her dementia in the setting of infection (UTI) and dehydration.  Avoiding narcotics and benzos.  discontinued amitriptyline and Claritin.  Low-dose PRN Haldol for sundowning.  Head CT negative for acute findings.  Chest x-ray negative for infiltrate.   Encephalopathy has partially improved since admission.  Suspect she has baseline dementia with delirium.    Acute kidney injury Suspect prerenal with dehydration.  Possible UTI as well.  Improved with hydration.  Resume Lasix and fluid discontinued.  HCTZ and Micardis still on hold.  Will resume upon discharge.    UTI   Cultures negative.  Completed 5-day course of IV Rocephin.  Leukocytosis Reactive versus secondary to UTI.  Slowly improving on follow-up labs.  Essential hypertension On Cardizem and metoprolol.  Chronic diastolic CHF Recent hospitalization for decompensation 2 weeks back with right pleural effusion.  Iron deficiency anemia Received IV iron during last hospitalization.  Severe protein calorie malnutrition On supplement.  Megace held.  Generalized weakness Seen by PT/OT and recommend SNF as patient needs a lot of skilled care and daughter unable to take care of her at home.  Patient given bed offers.  Daughter closely involved in care and now hesitant on SNF admission and wants to discuss patient's current independent living [facility]) if she can be managed in their memory care unit. The independent living will assess the patient tomorrow morning and we can then decide on her disposition.  Code Status : DNR  Family Communication  : Daughter involved in care  Disposition Plan  : SNF versus memory care  Barriers For Discharge : Active symptoms  Consults  : None  Procedures  : Head CT  DVT Prophylaxis  : Subcu heparin  Lab Results  Component Value Date   PLT 244 10/28/2018    Antibiotics   :   Anti-infectives (From admission, onward)   Start     Dose/Rate Route Frequency Ordered Stop   10/25/18 0800  cefTRIAXone (ROCEPHIN) 1 g in sodium chloride 0.9 % 100 mL IVPB     1 g 200 mL/hr over 30 Minutes Intravenous Every 24 hours 10/25/18 0712          Objective:   Vitals:   10/29/18 0410 10/29/18 0457 10/29/18 0541 10/29/18 1300  BP: (!) 175/68 (!) 165/73 (!) 159/86 (!) 154/82  Pulse: 89 92 95 81  Resp:    16  Temp:    98.7 F (37.1 C)  TempSrc:    Oral  SpO2:    98%  Weight:      Height:        Wt Readings from Last 3 Encounters:  10/25/18 26.9 kg  10/09/18 61.4 kg  05/03/17 64.1 kg     Intake/Output Summary (Last 24 hours) at 10/29/2018 1617 Last data filed at 10/29/2018 1511 Gross per 24 hour  Intake 750 ml  Output 700 ml  Net 50 ml   Physical exam Not in distress HEENT: Significant improvement in rash over a fresh Moist mucosa Chest: Clear CVs: Normal S1-S2 GI: Soft, nontender, nondistended Musculoskeletal: Rash over her trunk and extremities improved CNS: Alert and oriented x2       Data Review:    CBC Recent Labs  Lab 10/24/18 2256 10/25/18 0516 10/26/18 0432 10/27/18 0506 10/28/18 0505  WBC 20.5* 16.4* 17.8* 15.4* 16.0*  HGB 11.8* 10.8* 9.9* 10.0* 10.7*  HCT 37.1 33.5* 31.9* 33.0* 33.8*  PLT 282 252 245 239 244  MCV 103.1* 100.6* 103.6* 107.1* 104.0*  MCH 32.8 32.4 32.1 32.5 32.9  MCHC 31.8 32.2 31.0 30.3 31.7  RDW 13.5 13.4 13.4 13.5 13.3  LYMPHSABS  --  0.8  --   --   --   MONOABS  --  0.1  --   --   --   EOSABS  --  0.6*  --   --   --   BASOSABS  --  0.0  --   --   --     Chemistries  Recent Labs  Lab 10/24/18 2256 10/25/18 0009 10/25/18 0516 10/26/18 0432 10/27/18 0506  NA 140  --  142 143 144  K 3.6  --  3.8 4.2 4.7  CL 109  --  113* 119* 119*  CO2 20*  --  19* 17* 17*  GLUCOSE 117*  --  130* 138* 117*  BUN 75*  --  70* 64* 47*  CREATININE 1.68* 1.70* 1.54* 1.14* 0.90  CALCIUM 9.5  --  9.1 8.8* 9.4    AST 39  --  34  --   --   ALT 30  --  29  --   --   ALKPHOS 148*  --  138*  --   --   BILITOT 0.7  --  0.6  --   --    ------------------------------------------------------------------------------------------------------------------ No results for input(s): CHOL, HDL, LDLCALC, TRIG, CHOLHDL, LDLDIRECT in the last 72 hours.  No results found for: HGBA1C ------------------------------------------------------------------------------------------------------------------ Recent Labs    10/27/18 0506  TSH 0.954   ------------------------------------------------------------------------------------------------------------------ No results for input(s): VITAMINB12, FOLATE, FERRITIN, TIBC, IRON, RETICCTPCT in the last 72 hours.  Coagulation profile No results for input(s): INR, PROTIME in the last 168 hours.  No results for input(s): DDIMER in the last 72 hours.  Cardiac Enzymes No results for input(s): CKMB, TROPONINI, MYOGLOBIN in the last 168 hours.  Invalid input(s): CK ------------------------------------------------------------------------------------------------------------------    Component Value Date/Time   BNP 109.8 (H) 10/01/2018 1700    Inpatient Medications  Scheduled Meds: . aspirin EC  81 mg Oral Daily  . atorvastatin  40 mg Oral QHS  . diltiazem  30 mg Oral Q12H  . feeding supplement (PRO-STAT SUGAR FREE 64)  30 mL Oral BID  . furosemide  20 mg Oral Daily  . heparin  5,000 Units Subcutaneous Q12H  . hydrocortisone cream   Topical TID  . metoprolol tartrate  50 mg Oral BID  . pantoprazole  40 mg Oral BID  . predniSONE  40 mg Oral Q breakfast   Continuous Infusions: . cefTRIAXone (ROCEPHIN)  IV 1 g (10/29/18 0906)   PRN Meds:.acetaminophen **OR** acetaminophen, diphenhydrAMINE, haloperidol lactate, magic mouthwash w/lidocaine, nitroGLYCERIN, phenol, polyethylene glycol  Micro Results Recent Results (from the past 240 hour(s))  MRSA PCR Screening      Status: None   Collection Time: 10/25/18  2:18 AM  Result Value Ref Range Status   MRSA by PCR NEGATIVE NEGATIVE Final    Comment:        The GeneXpert MRSA Assay (FDA approved for NASAL specimens only), is one component of a comprehensive MRSA colonization surveillance program. It is not intended to diagnose MRSA infection nor to guide or monitor treatment for MRSA infections. Performed at [facility], [place] [address] [place], [place] [address]   Urine Culture     Status: None   Collection Time: 10/25/18  3:08 AM  Result Value Ref Range Status   Specimen Description   Final    URINE, RANDOM Performed at [facility] [address] [place], [place] [address]    Special Requests   Final    NONE Performed at [facility], [place] [address], [place], [place] [address]    Culture   Final    Multiple bacterial morphotypes present, none predominant. Suggest appropriate recollection if clinically indicated.   Report Status 10/26/2018 FINAL  Final  Culture, blood (routine  x 2)     Status: None (Preliminary result)   Collection Time: 10/25/18  3:58 PM  Result Value Ref Range Status   Specimen Description   Final    BLOOD LEFT ARM Performed at [facility] [address], [place], [place] [address]    Special Requests   Final    BOTTLES DRAWN AEROBIC AND ANAEROBIC Blood Culture adequate volume Performed at [facility] [address] [place], [place] [address]    Culture   Final    NO GROWTH 4 DAYS Performed at [facility], [place] [address] [place], [place] [address]    Report Status PENDING  Incomplete  Culture, blood (routine x 2)     Status: None (Preliminary result)   Collection Time: 10/25/18  4:06 PM  Result Value Ref Range Status   Specimen Description   Final    BLOOD LEFT ARM Performed at [facility] [address], [place], [place] [address]     Special Requests   Final    BOTTLES DRAWN AEROBIC AND ANAEROBIC Blood Culture adequate volume Performed at [facility] [address], [place], [place] [address]    Culture   Final    NO GROWTH 4 DAYS Performed at [facility], [place] [address], [place], [place] [address]    Report Status PENDING  Incomplete    Radiology Reports Dg Chest 2 View  Result Date: 10/24/2018 CLINICAL DATA:  84 y/o  F; progressive rash all over the body. EXAM: CHEST - 2 VIEW COMPARISON:  10/01/2018 chest radiograph. 10/04/2018 esophagram. FINDINGS: Stable cardiac silhouette given projection and technique. Stable small right-sided pleural effusion and basilar opacity. No new consolidation. No pneumothorax. Stable hiatal hernia. No acute osseous abnormality is evident. IMPRESSION: Stable small right-sided pleural effusion and basilar opacity. Stable hiatal hernia. No new consolidation. Electronically Signed   By: Kristine Garbe M.D.   On: 10/24/2018 22:01   Dg Chest 2 View  Result Date: 10/01/2018 CLINICAL DATA:  84 y/o F; 4-5 days of weakness. Shortness of breath, cough, rales. EXAM: CHEST - 2 VIEW COMPARISON:  10/31/2016 chest radiograph. FINDINGS: Stable mildly enlarged cardiac silhouette given projection and technique. Small right pleural effusion. Right lung base opacification. Left lung base platelike atelectasis. No pneumothorax. Multilevel degenerative changes of the spine. No acute osseous abnormality is evident. IMPRESSION: Small right pleural effusion and basilar opacity which may represent associated atelectasis or pneumonia. Electronically Signed   By: Kristine Garbe M.D.   On: 10/01/2018 19:25   Ct Head Wo Contrast  Result Date: 10/24/2018 CLINICAL DATA:  Acute onset of altered mental status. Confusion and hallucinations. Found down. EXAM: CT HEAD WITHOUT CONTRAST TECHNIQUE: Contiguous axial images were obtained from the base of the skull through the  vertex without intravenous contrast. COMPARISON:  None. FINDINGS: Brain: No evidence of acute infarction, hemorrhage, hydrocephalus, extra-axial collection or mass lesion / mass effect. Evaluation is somewhat suboptimal due to motion artifact at the vertex. Prominence of the ventricles and sulci reflects mild cortical volume loss. Scattered periventricular and subcortical white matter change likely reflects small vessel ischemic microangiopathy. The brainstem and fourth ventricle are within normal limits. The basal ganglia are unremarkable in appearance. The cerebral hemispheres demonstrate grossly normal gray-white differentiation. No mass effect or midline shift is seen. Vascular: No hyperdense vessel or unexpected calcification. Skull: There is no evidence of fracture; visualized osseous structures are unremarkable in appearance. Sinuses/Orbits: The orbits are within  normal limits. The paranasal sinuses and mastoid air cells are well-aerated. Other: No significant soft tissue abnormalities are seen. IMPRESSION: 1. No acute intracranial pathology seen on CT. 2. Mild cortical volume loss and scattered small vessel ischemic microangiopathy. Electronically Signed   By: Garald Balding M.D.   On: 10/24/2018 22:24   Ct Angio Chest Pe W Or Wo Contrast  Result Date: 10/02/2018 CLINICAL DATA:  84 year old with bilateral leg edema and fatigue. Elevated D-dimer. EXAM: CT ANGIOGRAPHY CHEST WITH CONTRAST TECHNIQUE: Multidetector CT imaging of the chest was performed using the standard protocol during bolus administration of intravenous contrast. Multiplanar CT image reconstructions and MIPs were obtained to evaluate the vascular anatomy. CONTRAST:  175mL ISOVUE-370 IOPAMIDOL (ISOVUE-370) INJECTION 76% COMPARISON:  Chest radiograph earlier this day. FINDINGS: Cardiovascular: There are no filling defects within the pulmonary arteries to suggest pulmonary embolus. Aortic atherosclerosis and tortuosity without dissection.  Conventional branching pattern from the aortic arch. Mild multi chamber cardiomegaly. There are coronary artery calcifications. No significant pericardial effusion. Mediastinum/Nodes: Multiple small epicardial nodes, nonspecific. No enlarged mediastinal or hilar lymph nodes. Small to moderate hiatal hernia. Upper esophagus is patulous. 3 cm right thyroid or parathyroid nodule. Enlargement of the right thyroid lobe compared to left. Lungs/Pleura: Moderate right pleural effusion is partially loculated. Associated compressive atelectasis in the right lower and middle lobes. There is a small left pleural effusion with adjacent atelectasis. Vague ground-glass opacities with minimal septal thickening at the apices. No suspicious pulmonary mass. Upper Abdomen: Upper abdominal ascites adjacent to the liver and spleen. Musculoskeletal: Lucency in the upper sternal body with surrounding calcification, suggesting subacute fracture. Degenerative change in the spine. Review of the MIP images confirms the above findings. IMPRESSION: 1. No pulmonary embolus. 2. Moderate right pleural effusion, partially loculated. There is adjacent compressive atelectasis. Small left pleural effusion. 3. Minimal ground-glass opacities and septal thickening at the apices, possible early pulmonary edema. Multi chamber cardiomegaly. 4. Ascites in the upper abdomen. 5. Sternal lucency with surrounding calcifications, likely subacute or remote fracture. Focal lytic bone lesion is not excluded on imaging findings alone, but felt less likely particularly in the absence of known malignancy. 6. A 3 cm hypodense nodule abutting the right lobe of the thyroid gland may be an exophytic thyroid nodule or parathyroid nodule. Thyroid ultrasound may be of value, giving consideration for patient's advanced age. 7. Aortic Atherosclerosis (ICD10-I70.0). Coronary artery calcifications. Electronically Signed   By: Keith Rake M.D.   On: 10/02/2018 05:51   Dg  Esophagus  Result Date: 10/04/2018 CLINICAL DATA:  Difficulty swallowing. EXAM: ESOPHOGRAM/BARIUM SWALLOW TECHNIQUE: Single contrast examination was performed using thin barium. A barium tablet was also administered FLUOROSCOPY TIME:  Fluoroscopy Time:  2.9 minutes Radiation Exposure Index (if provided by the fluoroscopic device): 55.9 mGy Number of Acquired Spot Images: 3 COMPARISON:  None. FINDINGS: The proximal and mid esophagus are widely patent. No stricture or mass. The motility of the esophagus is abnormal with multiple disorganized tertiary waves. A large hiatal hernia is identified. This contains approximately 20% intrathoracic stomach. Transient narrowing of the distal esophagus at the level of the GE junction noted which resulted and attenuation of tablet transit into the intrathoracic portions of the stomach. After several swallows of thin barium and water the tablet eventually passed into the stomach. Mild reflux identified. IMPRESSION: 1. Large hiatal hernia. 2. Esophageal dysmotility. Electronically Signed   By: Kerby Moors M.D.   On: 10/04/2018 15:31   US Abdomen Limited  Result Date: 10/02/2018 CLINICAL  DATA:  Abdominal distension EXAM: LIMITED ABDOMEN ULTRASOUND FOR ASCITES TECHNIQUE: Limited ultrasound survey for ascites was performed in all four abdominal quadrants. COMPARISON:  10/02/2018 FINDINGS: Survey of the abdominal 4 quadrants demonstrates a small to moderate amount of diffuse ascites. Right pleural effusion also noted. IMPRESSION: Small to moderate abdominopelvic ascites Right pleural effusion Electronically Signed   By: Jerilynn Mages.  Shick M.D.   On: 10/02/2018 15:14    Time Spent in minutes  25   Tywon Niday M.D on 10/29/2018 at 4:17 PM  Between 7am to 7pm - Pager - [phone]  After 7pm go to [email or web address] - password [facility]  [facility] -  Office  [phone]

## 2018-10-30 ENCOUNTER — Inpatient Hospital Stay (HOSPITAL_COMMUNITY): Payer: Medicare HMO

## 2018-10-30 LAB — BASIC METABOLIC PANEL
ANION GAP: 6 (ref 5–15)
BUN: 38 mg/dL — ABNORMAL HIGH (ref 8–23)
CO2: 21 mmol/L — ABNORMAL LOW (ref 22–32)
Calcium: 9.5 mg/dL (ref 8.9–10.3)
Chloride: 111 mmol/L (ref 98–111)
Creatinine, Ser: 0.8 mg/dL (ref 0.44–1.00)
GLUCOSE: 83 mg/dL (ref 70–99)
POTASSIUM: 3.1 mmol/L — AB (ref 3.5–5.1)
Sodium: 138 mmol/L (ref 135–145)

## 2018-10-30 LAB — CULTURE, BLOOD (ROUTINE X 2)
CULTURE: NO GROWTH
CULTURE: NO GROWTH
SPECIAL REQUESTS: ADEQUATE
Special Requests: ADEQUATE

## 2018-10-30 LAB — CBC
HCT: 32.8 % — ABNORMAL LOW (ref 36.0–46.0)
Hemoglobin: 10.5 g/dL — ABNORMAL LOW (ref 12.0–15.0)
MCH: 32.2 pg (ref 26.0–34.0)
MCHC: 32 g/dL (ref 30.0–36.0)
MCV: 100.6 fL — AB (ref 80.0–100.0)
NRBC: 0 % (ref 0.0–0.2)
PLATELETS: 214 10*3/uL (ref 150–400)
RBC: 3.26 MIL/uL — AB (ref 3.87–5.11)
RDW: 13.1 % (ref 11.5–15.5)
WBC: 16.6 10*3/uL — AB (ref 4.0–10.5)

## 2018-10-30 MED ORDER — POTASSIUM CHLORIDE CRYS ER 20 MEQ PO TBCR
40.0000 meq | EXTENDED_RELEASE_TABLET | Freq: Two times a day (BID) | ORAL | Status: AC
  Administered 2018-10-30 (×2): 40 meq via ORAL
  Filled 2018-10-30 (×2): qty 2

## 2018-10-30 NOTE — Progress Notes (Signed)
Consult canceled per Jillyn Ledger, RN: [name] scheduled for chest Xray. May not need PIV. RN will re-enter order if needed.

## 2018-10-30 NOTE — Progress Notes (Signed)
IV leaking, MD rounding and discussed with pt ans daughter, pt does not want to be re-stuck again for IVs, daughter asked MD to leave IVs out for now.  IV d/c until further notification.  Md will review to start oral Abts. Plan dicussed with patient and daughter. SRP, RN

## 2018-10-30 NOTE — Progress Notes (Signed)
Physical Therapy Treatment Patient Details Name: Rebecca Ford MRN: [id number] DOB: 04/10/1930 Today's Date: 10/30/2018    History of Present Illness 84 year old female with coronary artery disease, hypertension, dyslipidemia, OA, MI, cataracts and recent hospitalization for decompensated diastolic CHF, failure to thrive was discharged to SNF brought to the ED with increasing confusion, diffuse macular rash    PT Comments    Pt assisted to [facility] and then ambulated in hallway.  Pt requiring min/guard assist for safety.  Family present and reports confusion over d/c recommendations.  Explained pt's current mobility status and recommendation for 24/7 assist for safety and cues in next venue (more assist in ALF or ILF vs SNF).  Family member reports she is uncertain if pt's cognitive status will improve and feels pt has poor short term memory so recommended she discuss this with PCP.    Follow Up Recommendations  SNF;Supervision/Assistance - 24 hour(vs more care in ALF or ILF)     Equipment Recommendations  None recommended by PT    Recommendations for Other Services       Precautions / Restrictions Precautions Precautions: Fall    Mobility  Bed Mobility               General bed mobility comments: pt up in recliner on arrival  Transfers Overall transfer level: Needs assistance Equipment used: Rolling walker (2 wheeled) Transfers: Sit to/from [facility] Sit to Stand: Min guard Stand pivot transfers: Min guard       General transfer comment: verbal and visual cues for technique, assisted to [facility] prior to ambulation; pt requiring cues for hygiene and safety  Ambulation/Gait Ambulation/Gait assistance: Min assist Gait Distance (Feet): 100 Feet Assistive device: Rolling walker (2 wheeled) Gait Pattern/deviations: Step-through pattern;Decreased stride length     General Gait Details: verbal cues for safe use of RW, distance to tolerance   Stairs              Wheelchair Mobility    Modified Rankin (Stroke Patients Only)       Balance Overall balance assessment: History of Falls;Needs assistance         Standing balance support: Bilateral upper extremity supported Standing balance-Leahy Scale: Poor                              Cognition Arousal/Alertness: Awake/alert Behavior During Therapy: WFL for tasks assessed/performed Overall Cognitive Status: Impaired/Different from baseline Area of Impairment: Memory;Safety/judgement;Following commands                 Orientation Level: Disoriented to;Situation   Memory: Decreased short-term memory Following Commands: Follows one step commands consistently Safety/Judgement: Decreased awareness of safety;Decreased awareness of deficits     General Comments: daughter reports poor short term memory, pt kept stating events from SNF       Exercises      General Comments        Pertinent Vitals/Pain Pain Assessment: No/denies pain    Home Living                      Prior Function            PT Goals (current goals can now be found in the care plan section) Progress towards PT goals: Progressing toward goals    Frequency    Min 2X/week      PT Plan Current plan remains appropriate    Co-evaluation  AM-PAC PT "6 Clicks" Daily Activity  Outcome Measure  Difficulty turning over in bed (including adjusting bedclothes, sheets and blankets)?: A Little Difficulty moving from lying on back to sitting on the side of the bed? : A Little Difficulty sitting down on and standing up from a chair with arms (e.g., wheelchair, bedside commode, etc,.)?: A Lot Help needed moving to and from a bed to chair (including a wheelchair)?: A Little Help needed walking in hospital room?: A Little Help needed climbing 3-5 steps with a railing? : A Lot 6 Click Score: 16    End of Session Equipment Utilized During Treatment: Gait  belt Activity Tolerance: Patient tolerated treatment well Patient left: in chair;with chair alarm set;with call bell/phone within reach;with family/visitor present   PT Visit Diagnosis: Difficulty in walking, not elsewhere classified (R26.2);Muscle weakness (generalized) (M62.81)     Time: 1310-1350 PT Time Calculation (min) (ACUTE ONLY): 40 min  Charges:  $Gait Training: 8-22 mins $Therapeutic Activity: 8-22 mins                     Carmelia Bake, PT, DPT Acute Rehabilitation Services Office: [phone] Pager: [phone]  Trena Platt 10/30/2018, 4:04 PM

## 2018-10-30 NOTE — Progress Notes (Signed)
PROGRESS NOTE                                                                                                                                                                                                             Patient Demographics:    Rebecca Ford, is a 84 y.o. female, DOB - [birth year], UYQ:[id number]  Admit date - 10/24/2018   Admitting Physician [name], MD  Outpatient Primary MD for the patient is [name], MD  LOS - 5  Outpatient Specialists : none  Chief Complaint  Patient presents with  . Allergic Reaction       Brief Narrative   84 year old female with coronary artery disease, hypertension, dyslipidemia, recent hospitalization for decompensated diastolic CHF, failure to thrive was discharged to SNF brought to the ED with increasing confusion, diffuse macular rash.  Per daughter who provided history she does have at least moderate dementia which was not diagnosed until her recent hospitalization.  She has been more forgetful in the recent past.  She was discharged to SNF on Megace recently.  For past 2 days she has noticed diffuse rash in her body with severe itching.  Also complaining of some difficulty swallowing.  No fevers, chills, nausea, vomiting, abdominal pain, shortness of breath, dysuria or diarrhea.  Patient is incontinent and wears diapers. In the ED blood work showed acute kidney injury and leukocytosis.  Admitted for further management.  10/30/2018: Seen alongside patient's daughter, patient's nurse, case manager Librarian, academic) and pharmacist.  Discussed extensively with the patient's daughter.  [name] reports visual hallucination, acute confusion and vague memory situation prior to presentation.  Persistent leukocytosis is noted.  Will repeat chest x-ray.  Will check procalcitonin level.  Rash, presumed to be a drug rash has improved significantly.  No itching.  I explained to  the patient's daughter that it will be difficult to make a diagnosis of dementia during this acute episode.  I also explained to the patient's daughter that assessment and work-up for possible dementing illness will be best pursued by the patient's primary care provider when patient is back to her baseline as current findings could be secondary to delirium/acute confusional state.  Further management will depend on hospital course.  Physical therapy input is appreciated, for skilled nursing facility placement.  Will need to work-up patient's persistent leukocytosis.  May  need ultrasound-guided thoracentesis for pleural fluid analysis.  Overall, patient seems to be improving.  Will gradually wean patient off oral prednisone.  Will start patient on Augmentin as the patient may not be keen on restarting IV access.   Subjective:   Right seems to have improved.  Persistent leukocytosis.  Discussed extensively with the patient's daughter.  Assessment  & Plan :   Diffuse macular skin rash Suspect drug reaction.  Was recently started on Cardizem and Megace.  Could be contributed by medications.  Discontinued Megace and placed on IV Solu-Medrol, PRN IV Benadryl for itching.  No clinical signs of infection except for possible UTI.  Blood and urine cultures negative. 10/30/2018: There are she is improving.  Will wean patient off prednisone.  Active problems Acute combined metabolic and toxic encephalopathy Possibly worsening of her dementia in the setting of infection (UTI) and dehydration.  Avoid narcotics and benzos.  discontinued amitriptyline and Claritin.  Low-dose PRN Haldol for agitation.  Head CT negative for acute findings.  Chest x-ray negative for infiltrate.   Encephalopathy has improved in the past 24-48 hours. 10/30/2018: Continue to treat reversible causes.  Cannot make the diagnosis of dementing illness in this setting.  Acute kidney injury Suspect prerenal with dehydration.  Possible UTI as  well.  Improved with hydration.  All fluids and resumed Lasix.  Hold HCTZ and Micardis.  Avoid nephrotoxins. 10/30/2018: Resolved.   UTI On empiric Rocephin.  Cultures negative.  Will treat for 5 days. 10/30/2018: Cannot make diagnosis of UTI with negative urine culture.  Leukocytosis Continue work-up.   Bilateral pleural effusion noted.   -Will evaluate evaluate in pursuing ultrasound-guided thoracentesis and pleural fluid analysis, if agreeable by patient and patient's daughter. -Will discuss this option for ultrasound-guided thoracentesis and pleural fluid analysis further with the patient and patient's daughter.   -Check procalcitonin level.  Essential hypertension On Cardizem and metoprolol.  Chronic diastolic CHF Recent hospitalization for decompensation 2 weeks back with right pleural effusion. 10/30/2018: CHF is currently compensated.  No volume overload.  Will hold Lasix for now.  Iron deficiency anemia Received IV iron during last hospitalization.  Severe protein calorie malnutrition On supplement.  Megace held. 10/30/2018: Low albumin-query significance, prognostic versus nutritional.  Overall, suspect guarded prognosis  Generalized weakness Seen by PT/OT and recommend SNF as patient needs a lot of skilled care and daughter unable to take care of her at home.  Code Status : DNR  Family Communication  : Daughter.   Disposition Plan  : SNF possibly in the next 24-48 hours if rash improves.  Barriers For Discharge : Active symptoms  Consults  : None  Procedures  : Head CT  DVT Prophylaxis  : Subcu heparin  Lab Results  Component Value Date   PLT 214 10/30/2018    Antibiotics  :   Anti-infectives (From admission, onward)   Start     Dose/Rate Route Frequency Ordered Stop   10/25/18 0800  cefTRIAXone (ROCEPHIN) 1 g in sodium chloride 0.9 % 100 mL IVPB  Status:  Discontinued     1 g 200 mL/hr over 30 Minutes Intravenous Every 24 hours 10/25/18 0712  10/30/18 1117        Objective:   Vitals:   10/29/18 1300 10/29/18 2041 10/29/18 2100 10/30/18 0414  BP: (!) 154/82 (!) 145/69  135/69  Pulse: 81 (!) 106 100 97  Resp: 16 16 16 16   Temp: 98.7 F (37.1 C) 98.2 F (36.8 C)  98.1 F (36.7  C)  TempSrc: Oral Oral  Oral  SpO2: 98% 98%  96%  Weight:      Height:        Wt Readings from Last 3 Encounters:  10/25/18 26.9 kg  10/09/18 61.4 kg  05/03/17 64.1 kg     Intake/Output Summary (Last 24 hours) at 10/30/2018 1119 Last data filed at 10/29/2018 1511 Gross per 24 hour  Intake 160 ml  Output 100 ml  Net 60 ml   Physical exam Not in distress HEENT: Rash over the face, moist mucosa Chest: Clear bilaterally CVs: Normal S1 and S2 Musculoskeletal: Warm, no edema, rash seems to have improved significantly.  CNS: Awake and alert.  Patient moves all limbs.      Data Review:    CBC Recent Labs  Lab 10/25/18 0516 10/26/18 0432 10/27/18 0506 10/28/18 0505 10/30/18 0447  WBC 16.4* 17.8* 15.4* 16.0* 16.6*  HGB 10.8* 9.9* 10.0* 10.7* 10.5*  HCT 33.5* 31.9* 33.0* 33.8* 32.8*  PLT 252 245 239 244 214  MCV 100.6* 103.6* 107.1* 104.0* 100.6*  MCH 32.4 32.1 32.5 32.9 32.2  MCHC 32.2 31.0 30.3 31.7 32.0  RDW 13.4 13.4 13.5 13.3 13.1  LYMPHSABS 0.8  --   --   --   --   MONOABS 0.1  --   --   --   --   EOSABS 0.6*  --   --   --   --   BASOSABS 0.0  --   --   --   --     Chemistries  Recent Labs  Lab 10/24/18 2256 10/25/18 0009 10/25/18 0516 10/26/18 0432 10/27/18 0506 10/30/18 0447  NA 140  --  142 143 144 138  K 3.6  --  3.8 4.2 4.7 3.1*  CL 109  --  113* 119* 119* 111  CO2 20*  --  19* 17* 17* 21*  GLUCOSE 117*  --  130* 138* 117* 83  BUN 75*  --  70* 64* 47* 38*  CREATININE 1.68* 1.70* 1.54* 1.14* 0.90 0.80  CALCIUM 9.5  --  9.1 8.8* 9.4 9.5  AST 39  --  34  --   --   --   ALT 30  --  29  --   --   --   ALKPHOS 148*  --  138*  --   --   --   BILITOT 0.7  --  0.6  --   --   --     ------------------------------------------------------------------------------------------------------------------ No results for input(s): CHOL, HDL, LDLCALC, TRIG, CHOLHDL, LDLDIRECT in the last 72 hours.  No results found for: HGBA1C ------------------------------------------------------------------------------------------------------------------ No results for input(s): TSH, T4TOTAL, T3FREE, THYROIDAB in the last 72 hours.  Invalid input(s): FREET3 ------------------------------------------------------------------------------------------------------------------ No results for input(s): VITAMINB12, FOLATE, FERRITIN, TIBC, IRON, RETICCTPCT in the last 72 hours.  Coagulation profile No results for input(s): INR, PROTIME in the last 168 hours.  No results for input(s): DDIMER in the last 72 hours.  Cardiac Enzymes No results for input(s): CKMB, TROPONINI, MYOGLOBIN in the last 168 hours.  Invalid input(s): CK ------------------------------------------------------------------------------------------------------------------    Component Value Date/Time   BNP 109.8 (H) 10/01/2018 1700    Inpatient Medications  Scheduled Meds: . aspirin EC  81 mg Oral Daily  . atorvastatin  40 mg Oral QHS  . diltiazem  30 mg Oral Q12H  . feeding supplement (PRO-STAT SUGAR FREE 64)  30 mL Oral BID  . furosemide  20 mg Oral Daily  . heparin  5,000 Units Subcutaneous Q12H  . hydrocortisone cream   Topical TID  . metoprolol tartrate  50 mg Oral BID  . pantoprazole  40 mg Oral BID  . potassium chloride  40 mEq Oral BID  . predniSONE  40 mg Oral Q breakfast   Continuous Infusions:  PRN Meds:.acetaminophen **OR** acetaminophen, diphenhydrAMINE, haloperidol lactate, magic mouthwash w/lidocaine, nitroGLYCERIN, phenol, polyethylene glycol  Micro Results Recent Results (from the past 240 hour(s))  MRSA PCR Screening     Status: None   Collection Time: 10/25/18  2:18 AM  Result Value Ref Range  Status   MRSA by PCR NEGATIVE NEGATIVE Final    Comment:        The GeneXpert MRSA Assay (FDA approved for NASAL specimens only), is one component of a comprehensive MRSA colonization surveillance program. It is not intended to diagnose MRSA infection nor to guide or monitor treatment for MRSA infections. Performed at [facility], [place] [address], [place], [place] [address]   Urine Culture     Status: None   Collection Time: 10/25/18  3:08 AM  Result Value Ref Range Status   Specimen Description   Final    URINE, RANDOM Performed at [place] [address], [place], [place] [address]    Special Requests   Final    NONE Performed at [facility], [place] [address], [place], [place] [address]    Culture   Final    Multiple bacterial morphotypes present, none predominant. Suggest appropriate recollection if clinically indicated.   Report Status 10/26/2018 FINAL  Final  Culture, blood (routine x 2)     Status: None (Preliminary result)   Collection Time: 10/25/18  3:58 PM  Result Value Ref Range Status   Specimen Description   Final    BLOOD LEFT ARM Performed at [place] [address] [place], [place] [address]    Special Requests   Final    BOTTLES DRAWN AEROBIC AND ANAEROBIC Blood Culture adequate volume Performed at [place] [address] [place], [place] [address]    Culture   Final    NO GROWTH 4 DAYS Performed at [facility], [place] [address] [place], [place] [address]    Report Status PENDING  Incomplete  Culture, blood (routine x 2)     Status: None (Preliminary result)   Collection Time: 10/25/18  4:06 PM  Result Value Ref Range Status   Specimen Description   Final    BLOOD LEFT ARM Performed at [place] [address], [place], [place] [address]    Special Requests   Final    BOTTLES DRAWN AEROBIC AND ANAEROBIC Blood  Culture adequate volume Performed at [place] [address] [place], [place] [address]    Culture   Final    NO GROWTH 4 DAYS Performed at [facility], [place] [address], [place], [place] [address]    Report Status PENDING  Incomplete    Radiology Reports Dg Chest 2 View  Result Date: 10/24/2018 CLINICAL DATA:  84 y/o  F; progressive rash all over the body. EXAM: CHEST - 2 VIEW COMPARISON:  10/01/2018 chest radiograph. 10/04/2018 esophagram. FINDINGS: Stable cardiac silhouette given projection and technique. Stable small right-sided pleural effusion and basilar opacity. No new consolidation. No pneumothorax. Stable hiatal hernia. No acute osseous abnormality is evident. IMPRESSION: Stable small right-sided pleural effusion and basilar opacity. Stable hiatal hernia. No  new consolidation. Electronically Signed   By: Kristine Garbe M.D.   On: 10/24/2018 22:01   Dg Chest 2 View  Result Date: 10/01/2018 CLINICAL DATA:  84 y/o F; 4-5 days of weakness. Shortness of breath, cough, rales. EXAM: CHEST - 2 VIEW COMPARISON:  10/31/2016 chest radiograph. FINDINGS: Stable mildly enlarged cardiac silhouette given projection and technique. Small right pleural effusion. Right lung base opacification. Left lung base platelike atelectasis. No pneumothorax. Multilevel degenerative changes of the spine. No acute osseous abnormality is evident. IMPRESSION: Small right pleural effusion and basilar opacity which may represent associated atelectasis or pneumonia. Electronically Signed   By: Kristine Garbe M.D.   On: 10/01/2018 19:25   Ct Head Wo Contrast  Result Date: 10/24/2018 CLINICAL DATA:  Acute onset of altered mental status. Confusion and hallucinations. Found down. EXAM: CT HEAD WITHOUT CONTRAST TECHNIQUE: Contiguous axial images were obtained from the base of the skull through the vertex without intravenous contrast. COMPARISON:  None. FINDINGS: Brain: No  evidence of acute infarction, hemorrhage, hydrocephalus, extra-axial collection or mass lesion / mass effect. Evaluation is somewhat suboptimal due to motion artifact at the vertex. Prominence of the ventricles and sulci reflects mild cortical volume loss. Scattered periventricular and subcortical white matter change likely reflects small vessel ischemic microangiopathy. The brainstem and fourth ventricle are within normal limits. The basal ganglia are unremarkable in appearance. The cerebral hemispheres demonstrate grossly normal gray-white differentiation. No mass effect or midline shift is seen. Vascular: No hyperdense vessel or unexpected calcification. Skull: There is no evidence of fracture; visualized osseous structures are unremarkable in appearance. Sinuses/Orbits: The orbits are within normal limits. The paranasal sinuses and mastoid air cells are well-aerated. Other: No significant soft tissue abnormalities are seen. IMPRESSION: 1. No acute intracranial pathology seen on CT. 2. Mild cortical volume loss and scattered small vessel ischemic microangiopathy. Electronically Signed   By: Garald Balding M.D.   On: 10/24/2018 22:24   Ct Angio Chest Pe W Or Wo Contrast  Result Date: 10/02/2018 CLINICAL DATA:  84 year old with bilateral leg edema and fatigue. Elevated D-dimer. EXAM: CT ANGIOGRAPHY CHEST WITH CONTRAST TECHNIQUE: Multidetector CT imaging of the chest was performed using the standard protocol during bolus administration of intravenous contrast. Multiplanar CT image reconstructions and MIPs were obtained to evaluate the vascular anatomy. CONTRAST:  149mL ISOVUE-370 IOPAMIDOL (ISOVUE-370) INJECTION 76% COMPARISON:  Chest radiograph earlier this day. FINDINGS: Cardiovascular: There are no filling defects within the pulmonary arteries to suggest pulmonary embolus. Aortic atherosclerosis and tortuosity without dissection. Conventional branching pattern from the aortic arch. Mild multi chamber  cardiomegaly. There are coronary artery calcifications. No significant pericardial effusion. Mediastinum/Nodes: Multiple small epicardial nodes, nonspecific. No enlarged mediastinal or hilar lymph nodes. Small to moderate hiatal hernia. Upper esophagus is patulous. 3 cm right thyroid or parathyroid nodule. Enlargement of the right thyroid lobe compared to left. Lungs/Pleura: Moderate right pleural effusion is partially loculated. Associated compressive atelectasis in the right lower and middle lobes. There is a small left pleural effusion with adjacent atelectasis. Vague ground-glass opacities with minimal septal thickening at the apices. No suspicious pulmonary mass. Upper Abdomen: Upper abdominal ascites adjacent to the liver and spleen. Musculoskeletal: Lucency in the upper sternal body with surrounding calcification, suggesting subacute fracture. Degenerative change in the spine. Review of the MIP images confirms the above findings. IMPRESSION: 1. No pulmonary embolus. 2. Moderate right pleural effusion, partially loculated. There is adjacent compressive atelectasis. Small left pleural effusion. 3. Minimal ground-glass opacities and septal thickening at the  apices, possible early pulmonary edema. Multi chamber cardiomegaly. 4. Ascites in the upper abdomen. 5. Sternal lucency with surrounding calcifications, likely subacute or remote fracture. Focal lytic bone lesion is not excluded on imaging findings alone, but felt less likely particularly in the absence of known malignancy. 6. A 3 cm hypodense nodule abutting the right lobe of the thyroid gland may be an exophytic thyroid nodule or parathyroid nodule. Thyroid ultrasound may be of value, giving consideration for patient's advanced age. 7. Aortic Atherosclerosis (ICD10-I70.0). Coronary artery calcifications. Electronically Signed   By: Keith Rake M.D.   On: 10/02/2018 05:51   Dg Esophagus  Result Date: 10/04/2018 CLINICAL DATA:  Difficulty  swallowing. EXAM: ESOPHOGRAM/BARIUM SWALLOW TECHNIQUE: Single contrast examination was performed using thin barium. A barium tablet was also administered FLUOROSCOPY TIME:  Fluoroscopy Time:  2.9 minutes Radiation Exposure Index (if provided by the fluoroscopic device): 55.9 mGy Number of Acquired Spot Images: 3 COMPARISON:  None. FINDINGS: The proximal and mid esophagus are widely patent. No stricture or mass. The motility of the esophagus is abnormal with multiple disorganized tertiary waves. A large hiatal hernia is identified. This contains approximately 20% intrathoracic stomach. Transient narrowing of the distal esophagus at the level of the GE junction noted which resulted and attenuation of tablet transit into the intrathoracic portions of the stomach. After several swallows of thin barium and water the tablet eventually passed into the stomach. Mild reflux identified. IMPRESSION: 1. Large hiatal hernia. 2. Esophageal dysmotility. Electronically Signed   By: Kerby Moors M.D.   On: 10/04/2018 15:31   US Abdomen Limited  Result Date: 10/02/2018 CLINICAL DATA:  Abdominal distension EXAM: LIMITED ABDOMEN ULTRASOUND FOR ASCITES TECHNIQUE: Limited ultrasound survey for ascites was performed in all four abdominal quadrants. COMPARISON:  10/02/2018 FINDINGS: Survey of the abdominal 4 quadrants demonstrates a small to moderate amount of diffuse ascites. Right pleural effusion also noted. IMPRESSION: Small to moderate abdominopelvic ascites Right pleural effusion Electronically Signed   By: Jerilynn Mages.  Shick M.D.   On: 10/02/2018 15:14    Time Spent in minutes  25   Bonnell Public M.D on 10/30/2018 at 11:19 AM  Between 7am to 7pm - Pager - [phone].  After 7pm go to [email or web address] - password [facility]  [facility] -  Office  [phone]

## 2018-10-31 LAB — PROCALCITONIN: Procalcitonin: <0.1 ng/mL

## 2018-10-31 LAB — CBC WITH DIFFERENTIAL/PLATELET
Abs Immature Granulocytes: 0.13 10*3/uL — ABNORMAL HIGH (ref 0.00–0.07)
Basophils Absolute: 0 10*3/uL (ref 0.0–0.1)
Basophils Relative: 0 %
Eosinophils Absolute: 1.1 10*3/uL — ABNORMAL HIGH (ref 0.0–0.5)
Eosinophils Relative: 8 %
HCT: 32.4 % — ABNORMAL LOW (ref 36.0–46.0)
Hemoglobin: 10.2 g/dL — ABNORMAL LOW (ref 12.0–15.0)
Immature Granulocytes: 1 %
Lymphocytes Relative: 5 %
Lymphs Abs: 0.8 10*3/uL (ref 0.7–4.0)
MCH: 32.3 pg (ref 26.0–34.0)
MCHC: 31.5 g/dL (ref 30.0–36.0)
MCV: 102.5 fL — ABNORMAL HIGH (ref 80.0–100.0)
Monocytes Absolute: 1 10*3/uL (ref 0.1–1.0)
Monocytes Relative: 6 %
Neutro Abs: 12.2 10*3/uL — ABNORMAL HIGH (ref 1.7–7.7)
Neutrophils Relative %: 80 %
Platelets: 226 10*3/uL (ref 150–400)
RBC: 3.16 MIL/uL — ABNORMAL LOW (ref 3.87–5.11)
RDW: 13.2 % (ref 11.5–15.5)
WBC: 15.3 10*3/uL — ABNORMAL HIGH (ref 4.0–10.5)
nRBC: 0 % (ref 0.0–0.2)

## 2018-10-31 LAB — COMPREHENSIVE METABOLIC PANEL
ALT: 28 U/L (ref 0–44)
AST: 35 U/L (ref 15–41)
Albumin: 2.3 g/dL — ABNORMAL LOW (ref 3.5–5.0)
Alkaline Phosphatase: 111 U/L (ref 38–126)
Anion gap: 7 (ref 5–15)
BUN: 35 mg/dL — ABNORMAL HIGH (ref 8–23)
CO2: 21 mmol/L — ABNORMAL LOW (ref 22–32)
Calcium: 9.2 mg/dL (ref 8.9–10.3)
Chloride: 110 mmol/L (ref 98–111)
Creatinine, Ser: 0.81 mg/dL (ref 0.44–1.00)
GFR calc Af Amer: >60 mL/min (ref >60)
GFR calc non Af Amer: >60 mL/min (ref >60)
Glucose, Bld: 101 mg/dL — ABNORMAL HIGH (ref 70–99)
Potassium: 4.1 mmol/L (ref 3.5–5.1)
Sodium: 138 mmol/L (ref 135–145)
Total Bilirubin: 0.6 mg/dL (ref 0.3–1.2)
Total Protein: 6.1 g/dL — ABNORMAL LOW (ref 6.5–8.1)

## 2018-10-31 LAB — PROTIME-INR
INR: 1.24
Prothrombin Time: 15.5 seconds — ABNORMAL HIGH (ref 11.4–15.2)

## 2018-10-31 LAB — MAGNESIUM: Magnesium: 1.8 mg/dL (ref 1.7–2.4)

## 2018-10-31 LAB — PHOSPHORUS: Phosphorus: 1.9 mg/dL — ABNORMAL LOW (ref 2.5–4.6)

## 2018-10-31 MED ORDER — PREDNISONE 10 MG PO TABS
10.0000 mg | ORAL_TABLET | Freq: Every day | ORAL | Status: DC
  Administered 2018-11-04: 10 mg via ORAL
  Filled 2018-10-31: qty 1

## 2018-10-31 MED ORDER — PREDNISONE 20 MG PO TABS: 30.0000 mg | ORAL_TABLET | Freq: Every day | ORAL | Status: DC

## 2018-10-31 MED ORDER — AMOXICILLIN-POT CLAVULANATE 875-125 MG PO TABS
1.0000 | ORAL_TABLET | Freq: Two times a day (BID) | ORAL | Status: AC
  Administered 2018-10-31 – 2018-11-03 (×7): 1 via ORAL
  Filled 2018-10-31 (×7): qty 1

## 2018-10-31 MED ORDER — PREDNISONE 20 MG PO TABS
30.0000 mg | ORAL_TABLET | Freq: Every day | ORAL | Status: AC
  Administered 2018-10-31 – 2018-11-01 (×2): 30 mg via ORAL
  Filled 2018-10-31 (×2): qty 1

## 2018-10-31 MED ORDER — PREDNISONE 20 MG PO TABS
20.0000 mg | ORAL_TABLET | Freq: Every day | ORAL | Status: AC
  Administered 2018-11-02 – 2018-11-03 (×2): 20 mg via ORAL
  Filled 2018-10-31 (×2): qty 1

## 2018-10-31 MED ORDER — POTASSIUM & SODIUM PHOSPHATES 280-160-250 MG PO PACK
1.0000 | PACK | Freq: Three times a day (TID) | ORAL | Status: AC
  Administered 2018-10-31 – 2018-11-01 (×4): 1 via ORAL
  Filled 2018-10-31 (×4): qty 1

## 2018-10-31 NOTE — Progress Notes (Signed)
PROGRESS NOTE                                                                                                                                                                                                             Patient Demographics:    Rebecca Ford, is a 84 y.o. female, DOB - 1930/01/06, ZSW:[id number]  Admit date - 10/24/2018   Admitting Physician [name], MD  Outpatient Primary MD for the patient is [name], MD  LOS - 6  Outpatient Specialists : none  Chief Complaint  Patient presents with  . Allergic Reaction       Brief Narrative   84 year old female with coronary artery disease, hypertension, dyslipidemia, recent hospitalization for decompensated diastolic CHF, failure to thrive was discharged to SNF brought to the ED with increasing confusion, diffuse macular rash.  Per daughter who provided history she does have at least moderate dementia which was not diagnosed until her recent hospitalization.  She has been more forgetful in the recent past.  She was discharged to SNF on Megace recently.  For past 2 days she has noticed diffuse rash in her body with severe itching.  Also complaining of some difficulty swallowing.  No fevers, chills, nausea, vomiting, abdominal pain, shortness of breath, dysuria or diarrhea.  Patient is incontinent and wears diapers. In the ED blood work showed acute kidney injury and leukocytosis.  Admitted for further management.  10/30/2018: Seen alongside patient's daughter, patient's nurse, case manager Librarian, academic) and pharmacist.  Discussed extensively with the patient's daughter.  [name] reports visual hallucination, acute confusion and vague memory situation prior to presentation.  Persistent leukocytosis is noted.  Will repeat chest x-ray.  Will check procalcitonin level.  Rash, presumed to be a drug rash has improved significantly.  No itching.  I explained to  the patient's daughter that it will be difficult to make a diagnosis of dementia during this acute episode.  I also explained to the patient's daughter that assessment and work-up for possible dementing illness will be best pursued by the patient's primary care provider when patient is back to her baseline as current findings could be secondary to delirium/acute confusional state.  Further management will depend on hospital course.  Physical therapy input is appreciated, for skilled nursing facility placement.  Will need to work-up patient's persistent leukocytosis.  May  need ultrasound-guided thoracentesis for pleural fluid analysis.  Overall, patient seems to be improving.  Will gradually wean patient off oral prednisone.  Will start patient on Augmentin as the patient may not be keen on restarting IV access.  10/31/2018: Patient seen.  Patient recalls events of yesterday without much difficulty.  Patient is aware of the day, month and year.  As addressed earlier, further assessment for possible dementing process will be best made when patient is back to her baseline.  Patient is stable for discharge to skilled nursing facility.  Phosphorus is 1.9 today, will replete.  Procalcitonin is less than 0.1, hence, ultrasound guided thoracentesis for pleural fluid analysis was not pursued.  Worthy to mention the patient's albumin is also significantly low.   Subjective:   No new complaints.  No fever chills.  Patient maintains that she does not have a dementing illness.     Assessment  & Plan :   Diffuse macular skin rash Suspect drug reaction.  Was recently started on Cardizem and Megace.  Could be contributed by medications.  Discontinued Megace and placed on IV Solu-Medrol, PRN IV Benadryl for itching.  No clinical signs of infection except for possible UTI.  Blood and urine cultures negative. 10/30/2018: There are she is improving.  Will wean patient off prednisone. 10/31/2018: Continue to wean patient  off steroids.  Active problems Acute combined metabolic and toxic encephalopathy Possibly worsening of her dementia in the setting of infection (UTI) and dehydration.  Avoid narcotics and benzos.  discontinued amitriptyline and Claritin.  Low-dose PRN Haldol for agitation.  Head CT negative for acute findings.  Chest x-ray negative for infiltrate.   Encephalopathy has improved in the past 24-48 hours. 10/30/2018: Continue to treat reversible causes.  Cannot make the diagnosis of dementing illness in this setting. 10/31/2018: Acute encephalopathy has improved significantly.  Acute kidney injury Suspect prerenal with dehydration.  Possible UTI as well.  Improved with hydration.  All fluids and resumed Lasix.  Hold HCTZ and Micardis.  Avoid nephrotoxins. 10/30/2018: Resolved.   UTI On empiric Rocephin.  Cultures negative.  Will treat for 5 days. 10/30/2018: Cannot make diagnosis of UTI with negative urine culture.  Leukocytosis Continue work-up.   Bilateral pleural effusion noted.   -Will evaluate evaluate in pursuing ultrasound-guided thoracentesis and pleural fluid analysis, if agreeable by patient and patient's daughter. -Will discuss this option for ultrasound-guided thoracentesis and pleural fluid analysis further with the patient and patient's daughter.   -Check procalcitonin level. 10/31/2018: Procalcitonin is less than 0.1.  Continue to wean patient off steroids.  Essential hypertension On Cardizem and metoprolol.  Chronic diastolic CHF Recent hospitalization for decompensation 2 weeks back with right pleural effusion. 10/30/2018: CHF is currently compensated.  No volume overload.  Will hold Lasix for now.  Iron deficiency anemia Received IV iron during last hospitalization.  Severe protein calorie malnutrition On supplement.  Megace held. 10/30/2018: Low albumin-query significance, prognostic versus nutritional.  Overall, suspect guarded prognosis  Generalized  weakness Seen by PT/OT and recommend SNF as patient needs a lot of skilled care and daughter unable to take care of her at home.  Code Status : DNR  Family Communication  : Daughter.   Disposition Plan  : SNF.  Barriers For Discharge : Pursue disposition.  Consults  : None  Procedures  : Head CT  DVT Prophylaxis  : Subcu heparin  Lab Results  Component Value Date   PLT 226 10/31/2018    Antibiotics  :  Anti-infectives (From admission, onward)   Start     Dose/Rate Route Frequency Ordered Stop   10/31/18 1000  amoxicillin-clavulanate (AUGMENTIN) 875-125 MG per tablet 1 tablet     1 tablet Oral Every 12 hours 10/31/18 0247     10/25/18 0800  cefTRIAXone (ROCEPHIN) 1 g in sodium chloride 0.9 % 100 mL IVPB  Status:  Discontinued     1 g 200 mL/hr over 30 Minutes Intravenous Every 24 hours 10/25/18 0712 10/30/18 1117        Objective:   Vitals:   10/30/18 1417 10/30/18 2246 10/31/18 0200 10/31/18 0407  BP: (!) 103/52 (!) 145/61  (!) 146/69  Pulse: 84 99  87  Resp:  18  20  Temp: 97.7 F (36.5 C) 97.9 F (36.6 C)  98.2 F (36.8 C)  TempSrc: Oral Oral  Oral  SpO2: 97% 97%  99%  Weight:   60.4 kg   Height:        Wt Readings from Last 3 Encounters:  10/31/18 60.4 kg  10/09/18 61.4 kg  05/03/17 64.1 kg     Intake/Output Summary (Last 24 hours) at 10/31/2018 1122 Last data filed at 10/31/2018 0930 Gross per 24 hour  Intake 460 ml  Output -  Net 460 ml   Physical exam Not in distress HEENT: Mild pallor. No jaundice. Chest: Clear bilaterally CVs: Normal S1 and S2 Musculoskeletal: Warm, no edema, rash seems to have improved significantly.  CNS: Awake and alert.  Patient moves all limbs.      Data Review:    CBC Recent Labs  Lab 10/25/18 0516 10/26/18 0432 10/27/18 0506 10/28/18 0505 10/30/18 0447 10/31/18 0258  WBC 16.4* 17.8* 15.4* 16.0* 16.6* 15.3*  HGB 10.8* 9.9* 10.0* 10.7* 10.5* 10.2*  HCT 33.5* 31.9* 33.0* 33.8* 32.8* 32.4*  PLT 252  245 239 244 214 226  MCV 100.6* 103.6* 107.1* 104.0* 100.6* 102.5*  MCH 32.4 32.1 32.5 32.9 32.2 32.3  MCHC 32.2 31.0 30.3 31.7 32.0 31.5  RDW 13.4 13.4 13.5 13.3 13.1 13.2  LYMPHSABS 0.8  --   --   --   --  0.8  MONOABS 0.1  --   --   --   --  1.0  EOSABS 0.6*  --   --   --   --  1.1*  BASOSABS 0.0  --   --   --   --  0.0    Chemistries  Recent Labs  Lab 10/24/18 2256  10/25/18 0516 10/26/18 0432 10/27/18 0506 10/30/18 0447 10/31/18 0258  NA 140  --  142 143 144 138 138  K 3.6  --  3.8 4.2 4.7 3.1* 4.1  CL 109  --  113* 119* 119* 111 110  CO2 20*  --  19* 17* 17* 21* 21*  GLUCOSE 117*  --  130* 138* 117* 83 101*  BUN 75*  --  70* 64* 47* 38* 35*  CREATININE 1.68*   < > 1.54* 1.14* 0.90 0.80 0.81  CALCIUM 9.5  --  9.1 8.8* 9.4 9.5 9.2  MG  --   --   --   --   --   --  1.8  AST 39  --  34  --   --   --  35  ALT 30  --  29  --   --   --  28  ALKPHOS 148*  --  138*  --   --   --  111  BILITOT  0.7  --  0.6  --   --   --  0.6   < > = values in this interval not displayed.   ------------------------------------------------------------------------------------------------------------------ No results for input(s): CHOL, HDL, LDLCALC, TRIG, CHOLHDL, LDLDIRECT in the last 72 hours.  No results found for: HGBA1C ------------------------------------------------------------------------------------------------------------------ No results for input(s): TSH, T4TOTAL, T3FREE, THYROIDAB in the last 72 hours.  Invalid input(s): FREET3 ------------------------------------------------------------------------------------------------------------------ No results for input(s): VITAMINB12, FOLATE, FERRITIN, TIBC, IRON, RETICCTPCT in the last 72 hours.  Coagulation profile Recent Labs  Lab 10/31/18 0258  INR 1.24    No results for input(s): DDIMER in the last 72 hours.  Cardiac Enzymes No results for input(s): CKMB, TROPONINI, MYOGLOBIN in the last 168 hours.  Invalid input(s):  CK ------------------------------------------------------------------------------------------------------------------    Component Value Date/Time   BNP 109.8 (H) 10/01/2018 1700    Inpatient Medications  Scheduled Meds: . amoxicillin-clavulanate  1 tablet Oral Q12H  . aspirin EC  81 mg Oral Daily  . atorvastatin  40 mg Oral QHS  . diltiazem  30 mg Oral Q12H  . feeding supplement (PRO-STAT SUGAR FREE 64)  30 mL Oral BID  . heparin  5,000 Units Subcutaneous Q12H  . hydrocortisone cream   Topical TID  . metoprolol tartrate  50 mg Oral BID  . pantoprazole  40 mg Oral BID  . predniSONE  30 mg Oral Q breakfast   Followed by  . [START ON 11/02/2018] predniSONE  20 mg Oral Q breakfast   Followed by  . [START ON 11/04/2018] predniSONE  10 mg Oral Q breakfast   Continuous Infusions:  PRN Meds:.acetaminophen **OR** acetaminophen, diphenhydrAMINE, haloperidol lactate, magic mouthwash w/lidocaine, nitroGLYCERIN, phenol, polyethylene glycol  Micro Results Recent Results (from the past 240 hour(s))  MRSA PCR Screening     Status: None   Collection Time: 10/25/18  2:18 AM  Result Value Ref Range Status   MRSA by PCR NEGATIVE NEGATIVE Final    Comment:        The GeneXpert MRSA Assay (FDA approved for NASAL specimens only), is one component of a comprehensive MRSA colonization surveillance program. It is not intended to diagnose MRSA infection nor to guide or monitor treatment for MRSA infections. Performed at [facility], [place] [address] [place], [place] [address]   Urine Culture     Status: None   Collection Time: 10/25/18  3:08 AM  Result Value Ref Range Status   Specimen Description   Final    URINE, RANDOM Performed at [place] [address] [place], [place] [address]    Special Requests   Final    NONE Performed at [facility], [place] [address] [place], [place] [address]    Culture   Final    Multiple  bacterial morphotypes present, none predominant. Suggest appropriate recollection if clinically indicated.   Report Status 10/26/2018 FINAL  Final  Culture, blood (routine x 2)     Status: None   Collection Time: 10/25/18  3:58 PM  Result Value Ref Range Status   Specimen Description   Final    BLOOD LEFT ARM Performed at [place] [address] [place], [place] [address]    Special Requests   Final    BOTTLES DRAWN AEROBIC AND ANAEROBIC Blood Culture adequate volume Performed at [place] [address], [place], [place] [address]    Culture   Final    NO GROWTH 5 DAYS Performed at [facility], [place] [address] [place], [place] [address]    Report Status 10/30/2018 FINAL  Final  Culture, blood (routine x 2)     Status: None   Collection Time: 10/25/18  4:06 PM  Result Value Ref Range Status   Specimen Description   Final    BLOOD LEFT ARM Performed at [place] [address], [place], [place] [address]    Special Requests   Final    BOTTLES DRAWN AEROBIC AND ANAEROBIC Blood Culture adequate volume Performed at [place] [address] [place], [place] [address]    Culture   Final    NO GROWTH 5 DAYS Performed at [facility], [place] [address], [place], [place] [address]    Report Status 10/30/2018 FINAL  Final    Radiology Reports Dg Chest 2 View  Result Date: 10/30/2018 CLINICAL DATA:  Pneumonia EXAM: CHEST - 2 VIEW COMPARISON:  10/24/2018 FINDINGS: Low lung volumes. The patient's chin overlies the lung apices on the frontal projection. Heart size is top-normal. There is minimal aortic atherosclerosis at the arch. Stable small right and new small left pleural effusions with adjacent atelectasis. Air-fluid level within a small hiatal hernia is again noted. No acute nor suspicious osseous. Mild degenerative change along the thoracic spine IMPRESSION: Stable small right  pleural effusion with atelectasis. New small left pleural effusion with atelectasis. No alveolar consolidation conclusive for pneumonia. Stable small hiatal hernia. Electronically Signed   By: Ashley Royalty M.D.   On: 10/30/2018 15:04   Dg Chest 2 View  Result Date: 10/24/2018 CLINICAL DATA:  84 y/o  F; progressive rash all over the body. EXAM: CHEST - 2 VIEW COMPARISON:  10/01/2018 chest radiograph. 10/04/2018 esophagram. FINDINGS: Stable cardiac silhouette given projection and technique. Stable small right-sided pleural effusion and basilar opacity. No new consolidation. No pneumothorax. Stable hiatal hernia. No acute osseous abnormality is evident. IMPRESSION: Stable small right-sided pleural effusion and basilar opacity. Stable hiatal hernia. No new consolidation. Electronically Signed   By: Kristine Garbe M.D.   On: 10/24/2018 22:01   Dg Chest 2 View  Result Date: 10/01/2018 CLINICAL DATA:  84 y/o F; 4-5 days of weakness. Shortness of breath, cough, rales. EXAM: CHEST - 2 VIEW COMPARISON:  10/31/2016 chest radiograph. FINDINGS: Stable mildly enlarged cardiac silhouette given projection and technique. Small right pleural effusion. Right lung base opacification. Left lung base platelike atelectasis. No pneumothorax. Multilevel degenerative changes of the spine. No acute osseous abnormality is evident. IMPRESSION: Small right pleural effusion and basilar opacity which may represent associated atelectasis or pneumonia. Electronically Signed   By: Kristine Garbe M.D.   On: 10/01/2018 19:25   Ct Head Wo Contrast  Result Date: 10/24/2018 CLINICAL DATA:  Acute onset of altered mental status. Confusion and hallucinations. Found down. EXAM: CT HEAD WITHOUT CONTRAST TECHNIQUE: Contiguous axial images were obtained from the base of the skull through the vertex without intravenous contrast. COMPARISON:  None. FINDINGS: Brain: No evidence of acute infarction, hemorrhage, hydrocephalus,  extra-axial collection or mass lesion / mass effect. Evaluation is somewhat suboptimal due to motion artifact at the vertex. Prominence of the ventricles and sulci reflects mild cortical volume loss. Scattered periventricular and subcortical white matter change likely reflects small vessel ischemic microangiopathy. The brainstem and fourth ventricle are within normal limits. The basal ganglia are unremarkable in appearance. The cerebral hemispheres demonstrate grossly normal gray-white differentiation. No mass effect or midline shift is seen. Vascular: No hyperdense vessel or  unexpected calcification. Skull: There is no evidence of fracture; visualized osseous structures are unremarkable in appearance. Sinuses/Orbits: The orbits are within normal limits. The paranasal sinuses and mastoid air cells are well-aerated. Other: No significant soft tissue abnormalities are seen. IMPRESSION: 1. No acute intracranial pathology seen on CT. 2. Mild cortical volume loss and scattered small vessel ischemic microangiopathy. Electronically Signed   By: Garald Balding M.D.   On: 10/24/2018 22:24   Ct Angio Chest Pe W Or Wo Contrast  Result Date: 10/02/2018 CLINICAL DATA:  84 year old with bilateral leg edema and fatigue. Elevated D-dimer. EXAM: CT ANGIOGRAPHY CHEST WITH CONTRAST TECHNIQUE: Multidetector CT imaging of the chest was performed using the standard protocol during bolus administration of intravenous contrast. Multiplanar CT image reconstructions and MIPs were obtained to evaluate the vascular anatomy. CONTRAST:  131mL ISOVUE-370 IOPAMIDOL (ISOVUE-370) INJECTION 76% COMPARISON:  Chest radiograph earlier this day. FINDINGS: Cardiovascular: There are no filling defects within the pulmonary arteries to suggest pulmonary embolus. Aortic atherosclerosis and tortuosity without dissection. Conventional branching pattern from the aortic arch. Mild multi chamber cardiomegaly. There are coronary artery calcifications. No  significant pericardial effusion. Mediastinum/Nodes: Multiple small epicardial nodes, nonspecific. No enlarged mediastinal or hilar lymph nodes. Small to moderate hiatal hernia. Upper esophagus is patulous. 3 cm right thyroid or parathyroid nodule. Enlargement of the right thyroid lobe compared to left. Lungs/Pleura: Moderate right pleural effusion is partially loculated. Associated compressive atelectasis in the right lower and middle lobes. There is a small left pleural effusion with adjacent atelectasis. Vague ground-glass opacities with minimal septal thickening at the apices. No suspicious pulmonary mass. Upper Abdomen: Upper abdominal ascites adjacent to the liver and spleen. Musculoskeletal: Lucency in the upper sternal body with surrounding calcification, suggesting subacute fracture. Degenerative change in the spine. Review of the MIP images confirms the above findings. IMPRESSION: 1. No pulmonary embolus. 2. Moderate right pleural effusion, partially loculated. There is adjacent compressive atelectasis. Small left pleural effusion. 3. Minimal ground-glass opacities and septal thickening at the apices, possible early pulmonary edema. Multi chamber cardiomegaly. 4. Ascites in the upper abdomen. 5. Sternal lucency with surrounding calcifications, likely subacute or remote fracture. Focal lytic bone lesion is not excluded on imaging findings alone, but felt less likely particularly in the absence of known malignancy. 6. A 3 cm hypodense nodule abutting the right lobe of the thyroid gland may be an exophytic thyroid nodule or parathyroid nodule. Thyroid ultrasound may be of value, giving consideration for patient's advanced age. 7. Aortic Atherosclerosis (ICD10-I70.0). Coronary artery calcifications. Electronically Signed   By: Keith Rake M.D.   On: 10/02/2018 05:51   Dg Esophagus  Result Date: 10/04/2018 CLINICAL DATA:  Difficulty swallowing. EXAM: ESOPHOGRAM/BARIUM SWALLOW TECHNIQUE: Single  contrast examination was performed using thin barium. A barium tablet was also administered FLUOROSCOPY TIME:  Fluoroscopy Time:  2.9 minutes Radiation Exposure Index (if provided by the fluoroscopic device): 55.9 mGy Number of Acquired Spot Images: 3 COMPARISON:  None. FINDINGS: The proximal and mid esophagus are widely patent. No stricture or mass. The motility of the esophagus is abnormal with multiple disorganized tertiary waves. A large hiatal hernia is identified. This contains approximately 20% intrathoracic stomach. Transient narrowing of the distal esophagus at the level of the GE junction noted which resulted and attenuation of tablet transit into the intrathoracic portions of the stomach. After several swallows of thin barium and water the tablet eventually passed into the stomach. Mild reflux identified. IMPRESSION: 1. Large hiatal hernia. 2. Esophageal dysmotility. Electronically Signed  By: Kerby Moors M.D.   On: 10/04/2018 15:31   US Abdomen Limited  Result Date: 10/02/2018 CLINICAL DATA:  Abdominal distension EXAM: LIMITED ABDOMEN ULTRASOUND FOR ASCITES TECHNIQUE: Limited ultrasound survey for ascites was performed in all four abdominal quadrants. COMPARISON:  10/02/2018 FINDINGS: Survey of the abdominal 4 quadrants demonstrates a small to moderate amount of diffuse ascites. Right pleural effusion also noted. IMPRESSION: Small to moderate abdominopelvic ascites Right pleural effusion Electronically Signed   By: Jerilynn Mages.  Shick M.D.   On: 10/02/2018 15:14    Time Spent in minutes  25   Bonnell Public M.D on 10/31/2018 at 11:22 AM  Between 7am to 7pm - Pager - [phone].  After 7pm go to [email or web address] - password [facility]  [facility] -  Office  [phone]

## 2018-10-31 NOTE — Progress Notes (Signed)
Nutrition Follow-up  DOCUMENTATION CODES:   Not applicable  INTERVENTION:    30 ml Prostat BID, each supplement provides 100 kcals and 15 grams protein.   Magic cup TID with meals, each supplement provides 290 kcal and 9 grams of protein  NUTRITION DIAGNOSIS:   Inadequate oral intake related to lethargy/confusion(AMS) as evidenced by per patient/family report, NPO status.  Progressing  GOAL:   Patient will meet greater than or equal to 90% of their needs  Progressing  MONITOR:   Diet advancement, Labs, Weight trends, I & O's  REASON FOR ASSESSMENT:   Malnutrition Screening Tool    ASSESSMENT:   84 y.o. female with medical history significant of coronary artery disease, hypertension, dyslipidemia, recent hospitalization for decompensated diastolic congestive heart failure who was brought to the ED due to worsening confusion, diffuse rash all over the body.     11/17- soft diet  Pt reports her appetite is progressing slowly. States she does not like most of the cafeteria food and this is what prevents her from eating meals. Meal completions charted as 10-40% for her last five meals. RD observed lunch tray with 25% of pt's magic cup and [place] completed. Encouraged intake of high protein food options and supplements. Pt enjoys Magic Cups and would like to continue them. Megace has been started.   Weight noted to decreased from 135 lb on 11/14 to 133 lb today.   Plan for pt to d/c to SNF once newly developed skin rash resolves.   Medications reviewed and include: Phos-Nak, prednisone Labs reviewed: Phosphorus 1.9 (L)   Diet Order:   Diet Order            DIET SOFT Room service appropriate? Yes; Fluid consistency: Thin  Diet effective now              EDUCATION NEEDS:   Not appropriate for education at this time  Skin:  Skin Assessment: Reviewed RN Assessment  Last BM:  10/29/18  Height:   Ht Readings from Last 1 Encounters:  10/25/18 5\' 1"  (1.549 m)     Weight:   Wt Readings from Last 1 Encounters:  10/31/18 60.4 kg    Ideal Body Weight:  47.7 kg  BMI:  Body mass index is 25.16 kg/m.  Estimated Nutritional Needs:   Kcal:  1550-1750  Protein:  65-75g  Fluid:  1.5L/day   Mariana Single RD, LDN Clinical Nutrition Pager # [phone]

## 2018-10-31 NOTE — Progress Notes (Signed)
OT Cancellation Note  Patient Details Name: CLORINE SWING MRN: [id number] DOB: May 08, 1930   Cancelled Treatment:    Reason Eval/Treat Not Completed: Patient declined, no reason specified  Terry Abila 10/31/2018, 2:44 PM  Lesle Chris, OTR/L Acute Rehabilitation Services [phone] [facility] pager [phone] office 10/31/2018

## 2018-10-31 NOTE — Care Management Important Message (Signed)
Important Message  Patient Details  Name: Rebecca Ford MRN: [id number] Date of Birth: 18-Sep-1930   Medicare Important Message Given:  Yes    Kerin Salen 10/31/2018, 12:27 PM

## 2018-10-31 NOTE — Progress Notes (Signed)
Physical Therapy Treatment Patient Details Name: Rebecca Ford MRN: [id number] DOB: September 27, 1930 Today's Date: 10/31/2018    History of Present Illness 84 year old female with coronary artery disease, hypertension, dyslipidemia, OA, MI, cataracts and recent hospitalization for decompensated diastolic CHF, failure to thrive was discharged to SNF brought to the ED with increasing confusion, diffuse macular rash    PT Comments    Pt requesting PT today for gait training. Pt ambulated 2x80 ft with seated rest break, pt limited by fatigue. Pt requires multiple cues for safety during ambulation. Pt's daughter with continued concerns about pt's short term memory and other aspects of cognition. PT to continue to follow acutely.    Follow Up Recommendations  SNF;Supervision/Assistance - 24 hour(vs more care in ALF or ILF)     Equipment Recommendations  None recommended by PT    Recommendations for Other Services       Precautions / Restrictions Precautions Precautions: Fall Restrictions Weight Bearing Restrictions: No    Mobility  Bed Mobility Overal bed mobility: Needs Assistance Bed Mobility: Supine to Sit;Sit to Supine     Supine to sit: HOB elevated;Min guard Sit to supine: Min guard;HOB elevated   General bed mobility comments: Min guard for safety. Pt with increased time and effort.   Transfers Overall transfer level: Needs assistance Equipment used: Rolling walker (2 wheeled) Transfers: Sit to/from Stand Sit to Stand: Min guard         General transfer comment: Min guard provided for safety, pt with both hands on RW when rising.  Ambulation/Gait Ambulation/Gait assistance: Min assist Gait Distance (Feet): 80 Feet(2x80 ft, seated rest break required ) Assistive device: Rolling walker (2 wheeled) Gait Pattern/deviations: Step-through pattern;Decreased stride length Gait velocity: decr    General Gait Details: Repeated verbal cuing for safe use of RW, stepping into  RW. Pt requiring 2 minute seated rest break at 80 ft ambulation due to fatigue.    Stairs             Wheelchair Mobility    Modified Rankin (Stroke Patients Only)       Balance Overall balance assessment: History of Falls;Needs assistance Sitting-balance support: No upper extremity supported;Feet supported Sitting balance-Leahy Scale: Fair     Standing balance support: Bilateral upper extremity supported Standing balance-Leahy Scale: Poor Standing balance comment: relies on RW and steadying from PT                             Cognition Arousal/Alertness: Awake/alert Behavior During Therapy: WFL for tasks assessed/performed Overall Cognitive Status: Impaired/Different from baseline Area of Impairment: Memory;Safety/judgement;Following commands                 Orientation Level: Disoriented to;Situation   Memory: Decreased short-term memory Following Commands: Follows one step commands inconsistently Safety/Judgement: Decreased awareness of safety;Decreased awareness of deficits     General Comments: daughter at bedside, states concerns about pt's short term memory and pt's lack of insight into her deficits.       Exercises      General Comments        Pertinent Vitals/Pain Pain Assessment: No/denies pain    Home Living                      Prior Function            PT Goals (current goals can now be found in the care plan section) Acute Rehab PT  Goals Patient Stated Goal: none stated  PT Goal Formulation: With patient Time For Goal Achievement: 11/09/18 Potential to Achieve Goals: Good Progress towards PT goals: Progressing toward goals    Frequency    Min 2X/week      PT Plan Current plan remains appropriate    Co-evaluation              AM-PAC PT "6 Clicks" Daily Activity  Outcome Measure  Difficulty turning over in bed (including adjusting bedclothes, sheets and blankets)?: A Little Difficulty  moving from lying on back to sitting on the side of the bed? : A Little Difficulty sitting down on and standing up from a chair with arms (e.g., wheelchair, bedside commode, etc,.)?: A Lot Help needed moving to and from a bed to chair (including a wheelchair)?: A Little Help needed walking in hospital room?: A Little Help needed climbing 3-5 steps with a railing? : A Lot 6 Click Score: 16    End of Session Equipment Utilized During Treatment: Gait belt Activity Tolerance: Patient tolerated treatment well Patient left: with call bell/phone within reach;with family/visitor present;in bed;with bed alarm set Nurse Communication: Mobility status PT Visit Diagnosis: Difficulty in walking, not elsewhere classified (R26.2);Muscle weakness (generalized) (M62.81)     Time: 1859-0931 PT Time Calculation (min) (ACUTE ONLY): 14 min  Charges:  $Gait Training: 8-22 mins                    Julien Girt, PT Acute Rehabilitation Services Pager [phone]  Office [phone]    Tarnesha Ulloa D Elonda Husky 10/31/2018, 5:42 PM

## 2018-11-01 DIAGNOSIS — E860 Dehydration: Secondary | ICD-10-CM

## 2018-11-01 LAB — RENAL FUNCTION PANEL
Albumin: 2.4 g/dL — ABNORMAL LOW (ref 3.5–5.0)
Anion gap: 5 (ref 5–15)
BUN: 30 mg/dL — ABNORMAL HIGH (ref 8–23)
CO2: 22 mmol/L (ref 22–32)
Calcium: 9.4 mg/dL (ref 8.9–10.3)
Chloride: 109 mmol/L (ref 98–111)
Creatinine, Ser: 0.75 mg/dL (ref 0.44–1.00)
GFR calc Af Amer: >60 mL/min (ref >60)
GFR calc non Af Amer: >60 mL/min (ref >60)
Glucose, Bld: 91 mg/dL (ref 70–99)
Phosphorus: 3.1 mg/dL (ref 2.5–4.6)
Potassium: 4.3 mmol/L (ref 3.5–5.1)
Sodium: 136 mmol/L (ref 135–145)

## 2018-11-01 LAB — CBC WITH DIFFERENTIAL/PLATELET
Abs Immature Granulocytes: 0.15 10*3/uL — ABNORMAL HIGH (ref 0.00–0.07)
Basophils Absolute: 0 10*3/uL (ref 0.0–0.1)
Basophils Relative: 0 %
Eosinophils Absolute: 0.5 10*3/uL (ref 0.0–0.5)
Eosinophils Relative: 4 %
HCT: 31.9 % — ABNORMAL LOW (ref 36.0–46.0)
Hemoglobin: 10.1 g/dL — ABNORMAL LOW (ref 12.0–15.0)
Immature Granulocytes: 1 %
Lymphocytes Relative: 7 %
Lymphs Abs: 0.8 10*3/uL (ref 0.7–4.0)
MCH: 32.7 pg (ref 26.0–34.0)
MCHC: 31.7 g/dL (ref 30.0–36.0)
MCV: 103.2 fL — ABNORMAL HIGH (ref 80.0–100.0)
Monocytes Absolute: 0.9 10*3/uL (ref 0.1–1.0)
Monocytes Relative: 7 %
Neutro Abs: 10.3 10*3/uL — ABNORMAL HIGH (ref 1.7–7.7)
Neutrophils Relative %: 81 %
Platelets: 199 10*3/uL (ref 150–400)
RBC: 3.09 MIL/uL — ABNORMAL LOW (ref 3.87–5.11)
RDW: 13.5 % (ref 11.5–15.5)
WBC: 12.6 10*3/uL — ABNORMAL HIGH (ref 4.0–10.5)
nRBC: 0 % (ref 0.0–0.2)

## 2018-11-01 NOTE — Progress Notes (Signed)
PROGRESS NOTE                                                                                                                                                                                                             Patient Demographics:    Rebecca Ford, is a 84 y.o. female, DOB - Feb 13, 1930, FTD:[id number]  Admit date - 10/24/2018   Admitting Physician [name], MD  Outpatient Primary MD for the patient is [name], MD  LOS - 7  Outpatient Specialists : none  Chief Complaint  Patient presents with  . Allergic Reaction       Brief Narrative   84 year old female with coronary artery disease, hypertension, dyslipidemia, recent hospitalization for decompensated diastolic CHF, failure to thrive was discharged to SNF brought to the ED with increasing confusion, diffuse macular rash.  Per daughter who provided history she does have at least moderate dementia which was not diagnosed until her recent hospitalization.  She has been more forgetful in the recent past.  She was discharged to SNF on Megace recently.  For past 2 days she has noticed diffuse rash in her body with severe itching.  Also complaining of some difficulty swallowing.  No fevers, chills, nausea, vomiting, abdominal pain, shortness of breath, dysuria or diarrhea.  Patient is incontinent and wears diapers. In the ED blood work showed acute kidney injury and leukocytosis.  Admitted for further management.  10/30/2018: Seen alongside patient's daughter, patient's nurse, case manager Librarian, academic) and pharmacist.  Discussed extensively with the patient's daughter.  [name] reports visual hallucination, acute confusion and vague memory situation prior to presentation.  Persistent leukocytosis is noted.  Will repeat chest x-ray.  Will check procalcitonin level.  Rash, presumed to be a drug rash has improved significantly.  No itching.  I explained to  the patient's daughter that it will be difficult to make a diagnosis of dementia during this acute episode.  I also explained to the patient's daughter that assessment and work-up for possible dementing illness will be best pursued by the patient's primary care provider when patient is back to her baseline as current findings could be secondary to delirium/acute confusional state.  Further management will depend on hospital course.  Physical therapy input is appreciated, for skilled nursing facility placement.  Will need to work-up patient's persistent leukocytosis.  May  need ultrasound-guided thoracentesis for pleural fluid analysis.  Overall, patient seems to be improving.  Will gradually wean patient off oral prednisone.  Will start patient on Augmentin as the patient may not be keen on restarting IV access.  10/31/2018: Patient seen.  Patient recalls events of yesterday without much difficulty.  Patient is aware of the day, month and year.  As addressed earlier, further assessment for possible dementing process will be best made when patient is back to her baseline.  Patient is stable for discharge to skilled nursing facility.  Phosphorus is 1.9 today, will replete.  Procalcitonin is less than 0.1, hence, ultrasound guided thoracentesis for pleural fluid analysis was not pursued.  Worthy to mention the patient's albumin is also significantly low.  11/01/2018: Leukocytosis is improving.  Patient is stable for discharge.  Awaiting insurance approval.  Otherwise, no new findings.   Subjective:   No new complaints.  No fever chills.  Patient maintains that she does not have a dementing illness.     Assessment  & Plan :   Diffuse macular skin rash Suspect drug reaction.  Was recently started on Cardizem and Megace.  Could be contributed by medications.  Discontinued Megace and placed on IV Solu-Medrol, PRN IV Benadryl for itching.  No clinical signs of infection except for possible UTI.  Blood and urine  cultures negative. 10/30/2018: There are she is improving.  Will wean patient off prednisone. 10/31/2018: Continue to wean patient off steroids.  Active problems Acute combined metabolic and toxic encephalopathy Possibly worsening of her dementia in the setting of infection (UTI) and dehydration.  Avoid narcotics and benzos.  discontinued amitriptyline and Claritin.  Low-dose PRN Haldol for agitation.  Head CT negative for acute findings.  Chest x-ray negative for infiltrate.   Encephalopathy has improved in the past 24-48 hours. 10/30/2018: Continue to treat reversible causes.  Cannot make the diagnosis of dementing illness in this setting. 10/31/2018: Acute encephalopathy has improved significantly.  Acute kidney injury Suspect prerenal with dehydration.  Possible UTI as well.  Improved with hydration.  All fluids and resumed Lasix.  Hold HCTZ and Micardis.  Avoid nephrotoxins. 10/30/2018: Resolved.   UTI On empiric Rocephin.  Cultures negative.  Will treat for 5 days. 10/30/2018: Cannot make diagnosis of UTI with negative urine culture.  Leukocytosis Continue work-up.   Bilateral pleural effusion noted.   -Will evaluate evaluate in pursuing ultrasound-guided thoracentesis and pleural fluid analysis, if agreeable by patient and patient's daughter. -Will discuss this option for ultrasound-guided thoracentesis and pleural fluid analysis further with the patient and patient's daughter.   -Check procalcitonin level. 10/31/2018: Procalcitonin is less than 0.1.  Continue to wean patient off steroids. 11/01/2018: Leukocytosis is improving.  White count today is 12.6.   Essential hypertension On Cardizem and metoprolol.  Chronic diastolic CHF Recent hospitalization for decompensation 2 weeks back with right pleural effusion. 10/30/2018: CHF is currently compensated.  No volume overload.  Will hold Lasix for now.  Iron deficiency anemia Received IV iron during last  hospitalization.  Severe protein calorie malnutrition On supplement.  Megace held. 10/30/2018: Low albumin-query significance, prognostic versus nutritional.  Overall, suspect guarded prognosis  Generalized weakness Seen by PT/OT and recommend SNF as patient needs a lot of skilled care and daughter unable to take care of her at home.  Code Status : DNR  Family Communication  : Daughter.   Disposition Plan  : SNF.  Barriers For Discharge : Pursue disposition.  Consults  : None  Procedures  :  Head CT  DVT Prophylaxis  : Subcu heparin  Lab Results  Component Value Date   PLT 199 11/01/2018    Antibiotics  :   Anti-infectives (From admission, onward)   Start     Dose/Rate Route Frequency Ordered Stop   10/31/18 1000  amoxicillin-clavulanate (AUGMENTIN) 875-125 MG per tablet 1 tablet     1 tablet Oral Every 12 hours 10/31/18 0247 11/03/18 1125   10/25/18 0800  cefTRIAXone (ROCEPHIN) 1 g in sodium chloride 0.9 % 100 mL IVPB  Status:  Discontinued     1 g 200 mL/hr over 30 Minutes Intravenous Every 24 hours 10/25/18 0712 10/30/18 1117        Objective:   Vitals:   10/31/18 0200 10/31/18 0407 10/31/18 1228 11/01/18 0421  BP:  (!) 146/69 (!) 153/69 139/62  Pulse:  87 (!) 106 82  Resp:  20 17 16   Temp:  98.2 F (36.8 C) 98.1 F (36.7 C) 97.9 F (36.6 C)  TempSrc:  Oral Oral Oral  SpO2:  99% 100% 99%  Weight: 60.4 kg     Height:        Wt Readings from Last 3 Encounters:  10/31/18 60.4 kg  10/09/18 61.4 kg  05/03/17 64.1 kg     Intake/Output Summary (Last 24 hours) at 11/01/2018 1048 Last data filed at 10/31/2018 1323 Gross per 24 hour  Intake 420 ml  Output -  Net 420 ml   Physical exam Not in distress HEENT: Mild pallor. No jaundice. Chest: Clear bilaterally CVs: Normal S1 and S2 Musculoskeletal: Warm, no edema, rash seems to have improved significantly.  CNS: Awake and alert.  Patient moves all limbs.      Data Review:    CBC Recent Labs   Lab 10/27/18 0506 10/28/18 0505 10/30/18 0447 10/31/18 0258 11/01/18 0342  WBC 15.4* 16.0* 16.6* 15.3* 12.6*  HGB 10.0* 10.7* 10.5* 10.2* 10.1*  HCT 33.0* 33.8* 32.8* 32.4* 31.9*  PLT 239 244 214 226 199  MCV 107.1* 104.0* 100.6* 102.5* 103.2*  MCH 32.5 32.9 32.2 32.3 32.7  MCHC 30.3 31.7 32.0 31.5 31.7  RDW 13.5 13.3 13.1 13.2 13.5  LYMPHSABS  --   --   --  0.8 0.8  MONOABS  --   --   --  1.0 0.9  EOSABS  --   --   --  1.1* 0.5  BASOSABS  --   --   --  0.0 0.0    Chemistries  Recent Labs  Lab 10/26/18 0432 10/27/18 0506 10/30/18 0447 10/31/18 0258 11/01/18 0342  NA 143 144 138 138 136  K 4.2 4.7 3.1* 4.1 4.3  CL 119* 119* 111 110 109  CO2 17* 17* 21* 21* 22  GLUCOSE 138* 117* 83 101* 91  BUN 64* 47* 38* 35* 30*  CREATININE 1.14* 0.90 0.80 0.81 0.75  CALCIUM 8.8* 9.4 9.5 9.2 9.4  MG  --   --   --  1.8  --   AST  --   --   --  35  --   ALT  --   --   --  28  --   ALKPHOS  --   --   --  111  --   BILITOT  --   --   --  0.6  --    ------------------------------------------------------------------------------------------------------------------ No results for input(s): CHOL, HDL, LDLCALC, TRIG, CHOLHDL, LDLDIRECT in the last 72 hours.  No results found for: HGBA1C ------------------------------------------------------------------------------------------------------------------ No results for  input(s): TSH, T4TOTAL, T3FREE, THYROIDAB in the last 72 hours.  Invalid input(s): FREET3 ------------------------------------------------------------------------------------------------------------------ No results for input(s): VITAMINB12, FOLATE, FERRITIN, TIBC, IRON, RETICCTPCT in the last 72 hours.  Coagulation profile Recent Labs  Lab 10/31/18 0258  INR 1.24    No results for input(s): DDIMER in the last 72 hours.  Cardiac Enzymes No results for input(s): CKMB, TROPONINI, MYOGLOBIN in the last 168 hours.  Invalid input(s):  CK ------------------------------------------------------------------------------------------------------------------    Component Value Date/Time   BNP 109.8 (H) 10/01/2018 1700    Inpatient Medications  Scheduled Meds: . amoxicillin-clavulanate  1 tablet Oral Q12H  . aspirin EC  81 mg Oral Daily  . atorvastatin  40 mg Oral QHS  . diltiazem  30 mg Oral Q12H  . feeding supplement (PRO-STAT SUGAR FREE 64)  30 mL Oral BID  . heparin  5,000 Units Subcutaneous Q12H  . hydrocortisone cream   Topical TID  . metoprolol tartrate  50 mg Oral BID  . pantoprazole  40 mg Oral BID  . [START ON 11/02/2018] predniSONE  20 mg Oral Q breakfast   Followed by  . [START ON 11/04/2018] predniSONE  10 mg Oral Q breakfast   Continuous Infusions:  PRN Meds:.acetaminophen **OR** acetaminophen, diphenhydrAMINE, haloperidol lactate, magic mouthwash w/lidocaine, nitroGLYCERIN, phenol, polyethylene glycol  Micro Results Recent Results (from the past 240 hour(s))  MRSA PCR Screening     Status: None   Collection Time: 10/25/18  2:18 AM  Result Value Ref Range Status   MRSA by PCR NEGATIVE NEGATIVE Final    Comment:        The GeneXpert MRSA Assay (FDA approved for NASAL specimens only), is one component of a comprehensive MRSA colonization surveillance program. It is not intended to diagnose MRSA infection nor to guide or monitor treatment for MRSA infections. Performed at [facility], [place] [address] [place], [place] [address]   Urine Culture     Status: None   Collection Time: 10/25/18  3:08 AM  Result Value Ref Range Status   Specimen Description   Final    URINE, RANDOM Performed at [place] [address] [place], [place] [address]    Special Requests   Final    NONE Performed at [facility], [place] [address], [place], [place] [address]    Culture   Final    Multiple bacterial morphotypes present, none predominant. Suggest  appropriate recollection if clinically indicated.   Report Status 10/26/2018 FINAL  Final  Culture, blood (routine x 2)     Status: None   Collection Time: 10/25/18  3:58 PM  Result Value Ref Range Status   Specimen Description   Final    BLOOD LEFT ARM Performed at [place] [address], [place], [place] [address]    Special Requests   Final    BOTTLES DRAWN AEROBIC AND ANAEROBIC Blood Culture adequate volume Performed at [place] [address], [place], [place] [address]    Culture   Final    NO GROWTH 5 DAYS Performed at [facility], [place] [address] [place],  [address]    Report Status 10/30/2018 FINAL  Final  Culture, blood (routine x 2)     Status: None   Collection Time: 10/25/18  4:06 PM  Result Value Ref Range Status   Specimen Description   Final    BLOOD LEFT ARM Performed at [place] [address]  [place], [place] [address]    Special Requests   Final    BOTTLES DRAWN AEROBIC AND ANAEROBIC Blood Culture adequate volume Performed at [place] [address], [place], [place] [address]    Culture   Final    NO GROWTH 5 DAYS Performed at [facility], [place] [address], [place], [place] [address]    Report Status 10/30/2018 FINAL  Final    Radiology Reports Dg Chest 2 View  Result Date: 10/30/2018 CLINICAL DATA:  Pneumonia EXAM: CHEST - 2 VIEW COMPARISON:  10/24/2018 FINDINGS: Low lung volumes. The patient's chin overlies the lung apices on the frontal projection. Heart size is top-normal. There is minimal aortic atherosclerosis at the arch. Stable small right and new small left pleural effusions with adjacent atelectasis. Air-fluid level within a small hiatal hernia is again noted. No acute nor suspicious osseous. Mild degenerative change along the thoracic spine IMPRESSION: Stable small right pleural effusion with atelectasis. New small left pleural  effusion with atelectasis. No alveolar consolidation conclusive for pneumonia. Stable small hiatal hernia. Electronically Signed   By: Ashley Royalty M.D.   On: 10/30/2018 15:04   Dg Chest 2 View  Result Date: 10/24/2018 CLINICAL DATA:  84 y/o  F; progressive rash all over the body. EXAM: CHEST - 2 VIEW COMPARISON:  10/01/2018 chest radiograph. 10/04/2018 esophagram. FINDINGS: Stable cardiac silhouette given projection and technique. Stable small right-sided pleural effusion and basilar opacity. No new consolidation. No pneumothorax. Stable hiatal hernia. No acute osseous abnormality is evident. IMPRESSION: Stable small right-sided pleural effusion and basilar opacity. Stable hiatal hernia. No new consolidation. Electronically Signed   By: Kristine Garbe M.D.   On: 10/24/2018 22:01   Ct Head Wo Contrast  Result Date: 10/24/2018 CLINICAL DATA:  Acute onset of altered mental status. Confusion and hallucinations. Found down. EXAM: CT HEAD WITHOUT CONTRAST TECHNIQUE: Contiguous axial images were obtained from the base of the skull through the vertex without intravenous contrast. COMPARISON:  None. FINDINGS: Brain: No evidence of acute infarction, hemorrhage, hydrocephalus, extra-axial collection or mass lesion / mass effect. Evaluation is somewhat suboptimal due to motion artifact at the vertex. Prominence of the ventricles and sulci reflects mild cortical volume loss. Scattered periventricular and subcortical white matter change likely reflects small vessel ischemic microangiopathy. The brainstem and fourth ventricle are within normal limits. The basal ganglia are unremarkable in appearance. The cerebral hemispheres demonstrate grossly normal gray-white differentiation. No mass effect or midline shift is seen. Vascular: No hyperdense vessel or unexpected calcification. Skull: There is no evidence of fracture; visualized osseous structures are unremarkable in appearance. Sinuses/Orbits: The orbits are  within normal limits. The paranasal sinuses and mastoid air cells are well-aerated. Other: No significant soft tissue abnormalities are seen. IMPRESSION: 1. No acute intracranial pathology seen on CT. 2. Mild cortical volume loss and scattered small vessel ischemic microangiopathy. Electronically Signed   By: Garald Balding M.D.   On: 10/24/2018 22:24   Dg Esophagus  Result Date: 10/04/2018 CLINICAL DATA:  Difficulty swallowing. EXAM: ESOPHOGRAM/BARIUM SWALLOW TECHNIQUE: Single contrast examination was performed using thin barium. A barium tablet was also administered FLUOROSCOPY TIME:  Fluoroscopy Time:  2.9 minutes Radiation Exposure Index (if provided by the fluoroscopic device): 55.9 mGy Number of Acquired Spot Images: 3 COMPARISON:  None. FINDINGS: The proximal and mid esophagus are widely patent. No stricture or mass. The motility of the esophagus is abnormal with multiple disorganized tertiary waves. A large hiatal hernia is identified. This contains approximately 20%  intrathoracic stomach. Transient narrowing of the distal esophagus at the level of the GE junction noted which resulted and attenuation of tablet transit into the intrathoracic portions of the stomach. After several swallows of thin barium and water the tablet eventually passed into the stomach. Mild reflux identified. IMPRESSION: 1. Large hiatal hernia. 2. Esophageal dysmotility. Electronically Signed   By: Kerby Moors M.D.   On: 10/04/2018 15:31   US Abdomen Limited  Result Date: 10/02/2018 CLINICAL DATA:  Abdominal distension EXAM: LIMITED ABDOMEN ULTRASOUND FOR ASCITES TECHNIQUE: Limited ultrasound survey for ascites was performed in all four abdominal quadrants. COMPARISON:  10/02/2018 FINDINGS: Survey of the abdominal 4 quadrants demonstrates a small to moderate amount of diffuse ascites. Right pleural effusion also noted. IMPRESSION: Small to moderate abdominopelvic ascites Right pleural effusion Electronically Signed   By:  Jerilynn Mages.  Shick M.D.   On: 10/02/2018 15:14    Time Spent in minutes  25   Bonnell Public M.D on 11/01/2018 at 10:48 AM  Between 7am to 7pm - Pager - [phone].  After 7pm go to [email or web address] - password [facility]  [facility] -  Office  [phone]

## 2018-11-01 NOTE — Progress Notes (Signed)
Pt/daughter have selected for pt to admit to [facility] for short term rehab- CSW confirmed with admissions at facility so that [facility] authorization can be initiated (pt no longer managed by [facility]- authorization will be requested by facility).  Sharren Bridge, MSW, LCSW Clinical Social Work 11/01/2018 [phone]

## 2018-11-02 LAB — RENAL FUNCTION PANEL
Albumin: 2.2 g/dL — ABNORMAL LOW (ref 3.5–5.0)
Anion gap: 7 (ref 5–15)
BUN: 28 mg/dL — ABNORMAL HIGH (ref 8–23)
CO2: 19 mmol/L — ABNORMAL LOW (ref 22–32)
Calcium: 9.3 mg/dL (ref 8.9–10.3)
Chloride: 110 mmol/L (ref 98–111)
Creatinine, Ser: 0.8 mg/dL (ref 0.44–1.00)
GFR calc Af Amer: >60 mL/min (ref >60)
GFR calc non Af Amer: >60 mL/min (ref >60)
Glucose, Bld: 83 mg/dL (ref 70–99)
Phosphorus: 2.6 mg/dL (ref 2.5–4.6)
Potassium: 4 mmol/L (ref 3.5–5.1)
Sodium: 136 mmol/L (ref 135–145)

## 2018-11-02 LAB — CBC WITH DIFFERENTIAL/PLATELET
Abs Immature Granulocytes: 0.13 10*3/uL — ABNORMAL HIGH (ref 0.00–0.07)
Basophils Absolute: 0 10*3/uL (ref 0.0–0.1)
Basophils Relative: 0 %
Eosinophils Absolute: 0.8 10*3/uL — ABNORMAL HIGH (ref 0.0–0.5)
Eosinophils Relative: 7 %
HCT: 33.8 % — ABNORMAL LOW (ref 36.0–46.0)
Hemoglobin: 10.6 g/dL — ABNORMAL LOW (ref 12.0–15.0)
Immature Granulocytes: 1 %
Lymphocytes Relative: 7 %
Lymphs Abs: 0.8 10*3/uL (ref 0.7–4.0)
MCH: 31.9 pg (ref 26.0–34.0)
MCHC: 31.4 g/dL (ref 30.0–36.0)
MCV: 101.8 fL — ABNORMAL HIGH (ref 80.0–100.0)
Monocytes Absolute: 1 10*3/uL (ref 0.1–1.0)
Monocytes Relative: 7 %
Neutro Abs: 10 10*3/uL — ABNORMAL HIGH (ref 1.7–7.7)
Neutrophils Relative %: 78 %
Platelets: 218 10*3/uL (ref 150–400)
RBC: 3.32 MIL/uL — ABNORMAL LOW (ref 3.87–5.11)
RDW: 13.3 % (ref 11.5–15.5)
WBC: 12.8 10*3/uL — ABNORMAL HIGH (ref 4.0–10.5)
nRBC: 0 % (ref 0.0–0.2)

## 2018-11-02 NOTE — Progress Notes (Signed)
PROGRESS NOTE                                                                                                                                                                                                             Patient Demographics:    Rebecca Ford, is a 84 y.o. female, DOB - [birth year], NTI:[id number]  Admit date - 10/24/2018   Admitting Physician [name], MD  Outpatient Primary MD for the patient is [name], MD  LOS - 8  Outpatient Specialists : none  Chief Complaint  Patient presents with  . Allergic Reaction       Brief Narrative   84 year old female with coronary artery disease, hypertension, dyslipidemia, recent hospitalization for decompensated diastolic CHF, failure to thrive was discharged to SNF brought to the ED with increasing confusion, diffuse macular rash.  Per daughter who provided history she does have at least moderate dementia which was not diagnosed until her recent hospitalization.  She has been more forgetful in the recent past.  She was discharged to SNF on Megace recently.  For past 2 days she has noticed diffuse rash in her body with severe itching.  Also complaining of some difficulty swallowing.  No fevers, chills, nausea, vomiting, abdominal pain, shortness of breath, dysuria or diarrhea.  Patient is incontinent and wears diapers. In the ED blood work showed acute kidney injury and leukocytosis.  Admitted for further management.  10/30/2018: Seen alongside patient's daughter, patient's nurse, case manager Librarian, academic) and pharmacist.  Discussed extensively with the patient's daughter.  [name] reports visual hallucination, acute confusion and vague memory situation prior to presentation.  Persistent leukocytosis is noted.  Will repeat chest x-ray.  Will check procalcitonin level.  Rash, presumed to be a drug rash has improved significantly.  No itching.  I explained to  the patient's daughter that it will be difficult to make a diagnosis of dementia during this acute episode.  I also explained to the patient's daughter that assessment and work-up for possible dementing illness will be best pursued by the patient's primary care provider when patient is back to her baseline as current findings could be secondary to delirium/acute confusional state.  Further management will depend on hospital course.  Physical therapy input is appreciated, for skilled nursing facility placement.  Will need to work-up patient's persistent leukocytosis.  May  need ultrasound-guided thoracentesis for pleural fluid analysis.  Overall, patient seems to be improving.  Will gradually wean patient off oral prednisone.  Will start patient on Augmentin as the patient may not be keen on restarting IV access.  10/31/2018: Patient seen.  Patient recalls events of yesterday without much difficulty.  Patient is aware of the day, month and year.  As addressed earlier, further assessment for possible dementing process will be best made when patient is back to her baseline.  Patient is stable for discharge to skilled nursing facility.  Phosphorus is 1.9 today, will replete.  Procalcitonin is less than 0.1, hence, ultrasound guided thoracentesis for pleural fluid analysis was not pursued.  Worthy to mention the patient's albumin is also significantly low.  11/01/2018: Leukocytosis is improving.  Patient is stable for discharge.  Awaiting insurance approval.  Otherwise, no new findings.  11/02/2018: Patient seen alongside patient's friend.  No new complaints.  Leukocytosis is resolving.  Patient is awaiting disposition.   Subjective:   No new complaints.  No fever chills.  Patient maintains that she does not have a dementing illness.     Assessment  & Plan :   Diffuse macular skin rash Suspect drug reaction.  Was recently started on Cardizem and Megace.  Could be contributed by medications.  Discontinued  Megace and placed on IV Solu-Medrol, PRN IV Benadryl for itching.  No clinical signs of infection except for possible UTI.  Blood and urine cultures negative. 10/30/2018: There are she is improving.  Will wean patient off prednisone. 10/31/2018: Continue to wean patient off steroids.  Active problems Acute combined metabolic and toxic encephalopathy Possibly worsening of her dementia in the setting of infection (UTI) and dehydration.  Avoid narcotics and benzos.  discontinued amitriptyline and Claritin.  Low-dose PRN Haldol for agitation.  Head CT negative for acute findings.  Chest x-ray negative for infiltrate.   Encephalopathy has improved in the past 24-48 hours. 10/30/2018: Continue to treat reversible causes.  Cannot make the diagnosis of dementing illness in this setting. 10/31/2018: Acute encephalopathy has improved significantly. 11/02/2018: Encephalopathy has resolved.  Pursue disposition.    Acute kidney injury Suspect prerenal with dehydration.  Possible UTI as well.  Improved with hydration.  All fluids and resumed Lasix.  Hold HCTZ and Micardis.  Avoid nephrotoxins. 10/30/2018: Resolved.   UTI On empiric Rocephin.  Cultures negative.  Will treat for 5 days.  Leukocytosis Continue work-up.   Bilateral pleural effusion noted.   -Will evaluate evaluate in pursuing ultrasound-guided thoracentesis and pleural fluid analysis, if agreeable by patient and patient's daughter. -Will discuss this option for ultrasound-guided thoracentesis and pleural fluid analysis further with the patient and patient's daughter.   -Check procalcitonin level. 10/31/2018: Procalcitonin is less than 0.1.  Continue to wean patient off steroids. 11/01/2018: Leukocytosis is improving.  White count today is 12.6.   Essential hypertension On Cardizem and metoprolol.  Chronic diastolic CHF Recent hospitalization for decompensation 2 weeks back with right pleural effusion. 10/30/2018: CHF is currently  compensated.  No volume overload.  Will hold Lasix for now.  Iron deficiency anemia Received IV iron during last hospitalization.  Severe protein calorie malnutrition On supplement.  Megace held. 10/30/2018: Low albumin-query significance, prognostic versus nutritional.  Overall, suspect guarded prognosis  Generalized weakness Seen by PT/OT and recommend SNF as patient needs a lot of skilled care and daughter unable to take care of her at home.  Code Status : DNR  Family Communication  : Daughter.  Disposition Plan  : SNF.  Barriers For Discharge : Pursue disposition.  Consults  : None  Procedures  : Head CT  DVT Prophylaxis  : Subcu heparin  Lab Results  Component Value Date   PLT 218 11/02/2018    Antibiotics  :   Anti-infectives (From admission, onward)   Start     Dose/Rate Route Frequency Ordered Stop   10/31/18 1000  amoxicillin-clavulanate (AUGMENTIN) 875-125 MG per tablet 1 tablet     1 tablet Oral Every 12 hours 10/31/18 0247 11/03/18 1125   10/25/18 0800  cefTRIAXone (ROCEPHIN) 1 g in sodium chloride 0.9 % 100 mL IVPB  Status:  Discontinued     1 g 200 mL/hr over 30 Minutes Intravenous Every 24 hours 10/25/18 0712 10/30/18 1117        Objective:   Vitals:   11/01/18 0421 11/01/18 1346 11/01/18 2014 11/02/18 0707  BP: 139/62 (!) 156/63 (!) 158/76 (!) 143/63  Pulse: 82 86 (!) 101 81  Resp: 16 19 18 16   Temp: 97.9 F (36.6 C) 98.6 F (37 C) 97.7 F (36.5 C) 97.6 F (36.4 C)  TempSrc: Oral  Oral Oral  SpO2: 99% 98% 100% 98%  Weight:      Height:        Wt Readings from Last 3 Encounters:  10/31/18 60.4 kg  10/09/18 61.4 kg  05/03/17 64.1 kg     Intake/Output Summary (Last 24 hours) at 11/02/2018 1248 Last data filed at 11/01/2018 1346 Gross per 24 hour  Intake 240 ml  Output -  Net 240 ml   Physical exam Not in distress HEENT: Mild pallor. No jaundice. Chest: Clear bilaterally CVs: Normal S1 and S2 Musculoskeletal: Warm, no edema,  rash seems to have improved significantly.  CNS: Awake and alert.  Patient moves all limbs.      Data Review:    CBC Recent Labs  Lab 10/28/18 0505 10/30/18 0447 10/31/18 0258 11/01/18 0342 11/02/18 0420  WBC 16.0* 16.6* 15.3* 12.6* 12.8*  HGB 10.7* 10.5* 10.2* 10.1* 10.6*  HCT 33.8* 32.8* 32.4* 31.9* 33.8*  PLT 244 214 226 199 218  MCV 104.0* 100.6* 102.5* 103.2* 101.8*  MCH 32.9 32.2 32.3 32.7 31.9  MCHC 31.7 32.0 31.5 31.7 31.4  RDW 13.3 13.1 13.2 13.5 13.3  LYMPHSABS  --   --  0.8 0.8 0.8  MONOABS  --   --  1.0 0.9 1.0  EOSABS  --   --  1.1* 0.5 0.8*  BASOSABS  --   --  0.0 0.0 0.0    Chemistries  Recent Labs  Lab 10/27/18 0506 10/30/18 0447 10/31/18 0258 11/01/18 0342 11/02/18 0420  NA 144 138 138 136 136  K 4.7 3.1* 4.1 4.3 4.0  CL 119* 111 110 109 110  CO2 17* 21* 21* 22 19*  GLUCOSE 117* 83 101* 91 83  BUN 47* 38* 35* 30* 28*  CREATININE 0.90 0.80 0.81 0.75 0.80  CALCIUM 9.4 9.5 9.2 9.4 9.3  MG  --   --  1.8  --   --   AST  --   --  35  --   --   ALT  --   --  28  --   --   ALKPHOS  --   --  111  --   --   BILITOT  --   --  0.6  --   --    ------------------------------------------------------------------------------------------------------------------ No results for input(s): CHOL, HDL, LDLCALC, TRIG, CHOLHDL,  LDLDIRECT in the last 72 hours.  No results found for: HGBA1C ------------------------------------------------------------------------------------------------------------------ No results for input(s): TSH, T4TOTAL, T3FREE, THYROIDAB in the last 72 hours.  Invalid input(s): FREET3 ------------------------------------------------------------------------------------------------------------------ No results for input(s): VITAMINB12, FOLATE, FERRITIN, TIBC, IRON, RETICCTPCT in the last 72 hours.  Coagulation profile Recent Labs  Lab 10/31/18 0258  INR 1.24    No results for input(s): DDIMER in the last 72 hours.  Cardiac Enzymes No  results for input(s): CKMB, TROPONINI, MYOGLOBIN in the last 168 hours.  Invalid input(s): CK ------------------------------------------------------------------------------------------------------------------    Component Value Date/Time   BNP 109.8 (H) 10/01/2018 1700    Inpatient Medications  Scheduled Meds: . amoxicillin-clavulanate  1 tablet Oral Q12H  . aspirin EC  81 mg Oral Daily  . atorvastatin  40 mg Oral QHS  . diltiazem  30 mg Oral Q12H  . feeding supplement (PRO-STAT SUGAR FREE 64)  30 mL Oral BID  . heparin  5,000 Units Subcutaneous Q12H  . hydrocortisone cream   Topical TID  . metoprolol tartrate  50 mg Oral BID  . pantoprazole  40 mg Oral BID  . predniSONE  20 mg Oral Q breakfast   Followed by  . [START ON 11/04/2018] predniSONE  10 mg Oral Q breakfast   Continuous Infusions:  PRN Meds:.acetaminophen **OR** acetaminophen, diphenhydrAMINE, haloperidol lactate, magic mouthwash w/lidocaine, nitroGLYCERIN, phenol, polyethylene glycol  Micro Results Recent Results (from the past 240 hour(s))  MRSA PCR Screening     Status: None   Collection Time: 10/25/18  2:18 AM  Result Value Ref Range Status   MRSA by PCR NEGATIVE NEGATIVE Final    Comment:        The GeneXpert MRSA Assay (FDA approved for NASAL specimens only), is one component of a comprehensive MRSA colonization surveillance program. It is not intended to diagnose MRSA infection nor to guide or monitor treatment for MRSA infections. Performed at [facility], [place] [address] [place], [place] [address]   Urine Culture     Status: None   Collection Time: 10/25/18  3:08 AM  Result Value Ref Range Status   Specimen Description   Final    URINE, RANDOM Performed at [facility] [address], [place], [place] [address]    Special Requests   Final    NONE Performed at [facility], [place] [address], [place], [place] [address]    Culture    Final    Multiple bacterial morphotypes present, none predominant. Suggest appropriate recollection if clinically indicated.   Report Status 10/26/2018 FINAL  Final  Culture, blood (routine x 2)     Status: None   Collection Time: 10/25/18  3:58 PM  Result Value Ref Range Status   Specimen Description   Final    BLOOD LEFT ARM Performed at [facility] [address] [place], [place] [address]    Special Requests   Final    BOTTLES DRAWN AEROBIC AND ANAEROBIC Blood Culture adequate volume Performed at [place] [address], [place], [place] [address]    Culture   Final    NO GROWTH 5 DAYS Performed at [facility], [place] [address], [place], [place] [address]    Report Status 10/30/2018 FINAL  Final  Culture, blood (routine x 2)     Status: None   Collection Time: 10/25/18  4:06 PM  Result Value Ref Range Status   Specimen Description   Final  BLOOD LEFT ARM Performed at [facility], [place] [address], [place], [place] [address]    Special Requests   Final    BOTTLES DRAWN AEROBIC AND ANAEROBIC Blood Culture adequate volume Performed at [facility] [address] [place], [place] [address]    Culture   Final    NO GROWTH 5 DAYS Performed at [facility], [place] [address], [place], [place] [address]    Report Status 10/30/2018 FINAL  Final    Radiology Reports Dg Chest 2 View  Result Date: 10/30/2018 CLINICAL DATA:  Pneumonia EXAM: CHEST - 2 VIEW COMPARISON:  10/24/2018 FINDINGS: Low lung volumes. The patient's chin overlies the lung apices on the frontal projection. Heart size is top-normal. There is minimal aortic atherosclerosis at the arch. Stable small right and new small left pleural effusions with adjacent atelectasis. Air-fluid level within a small hiatal hernia is again noted. No acute nor suspicious osseous. Mild degenerative change along the thoracic spine IMPRESSION:  Stable small right pleural effusion with atelectasis. New small left pleural effusion with atelectasis. No alveolar consolidation conclusive for pneumonia. Stable small hiatal hernia. Electronically Signed   By: Ashley Royalty M.D.   On: 10/30/2018 15:04   Dg Chest 2 View  Result Date: 10/24/2018 CLINICAL DATA:  84 y/o  F; progressive rash all over the body. EXAM: CHEST - 2 VIEW COMPARISON:  10/01/2018 chest radiograph. 10/04/2018 esophagram. FINDINGS: Stable cardiac silhouette given projection and technique. Stable small right-sided pleural effusion and basilar opacity. No new consolidation. No pneumothorax. Stable hiatal hernia. No acute osseous abnormality is evident. IMPRESSION: Stable small right-sided pleural effusion and basilar opacity. Stable hiatal hernia. No new consolidation. Electronically Signed   By: Kristine Garbe M.D.   On: 10/24/2018 22:01   Ct Head Wo Contrast  Result Date: 10/24/2018 CLINICAL DATA:  Acute onset of altered mental status. Confusion and hallucinations. Found down. EXAM: CT HEAD WITHOUT CONTRAST TECHNIQUE: Contiguous axial images were obtained from the base of the skull through the vertex without intravenous contrast. COMPARISON:  None. FINDINGS: Brain: No evidence of acute infarction, hemorrhage, hydrocephalus, extra-axial collection or mass lesion / mass effect. Evaluation is somewhat suboptimal due to motion artifact at the vertex. Prominence of the ventricles and sulci reflects mild cortical volume loss. Scattered periventricular and subcortical white matter change likely reflects small vessel ischemic microangiopathy. The brainstem and fourth ventricle are within normal limits. The basal ganglia are unremarkable in appearance. The cerebral hemispheres demonstrate grossly normal gray-white differentiation. No mass effect or midline shift is seen. Vascular: No hyperdense vessel or unexpected calcification. Skull: There is no evidence of fracture; visualized  osseous structures are unremarkable in appearance. Sinuses/Orbits: The orbits are within normal limits. The paranasal sinuses and mastoid air cells are well-aerated. Other: No significant soft tissue abnormalities are seen. IMPRESSION: 1. No acute intracranial pathology seen on CT. 2. Mild cortical volume loss and scattered small vessel ischemic microangiopathy. Electronically Signed   By: Garald Balding M.D.   On: 10/24/2018 22:24   Dg Esophagus  Result Date: 10/04/2018 CLINICAL DATA:  Difficulty swallowing. EXAM: ESOPHOGRAM/BARIUM SWALLOW TECHNIQUE: Single contrast examination was performed using thin barium. A barium tablet was also administered FLUOROSCOPY TIME:  Fluoroscopy Time:  2.9 minutes Radiation Exposure Index (if provided by the fluoroscopic device): 55.9 mGy Number of Acquired Spot Images: 3 COMPARISON:  None. FINDINGS: The proximal and mid esophagus are widely patent. No stricture or mass. The motility of the esophagus is abnormal with multiple  disorganized tertiary waves. A large hiatal hernia is identified. This contains approximately 20% intrathoracic stomach. Transient narrowing of the distal esophagus at the level of the GE junction noted which resulted and attenuation of tablet transit into the intrathoracic portions of the stomach. After several swallows of thin barium and water the tablet eventually passed into the stomach. Mild reflux identified. IMPRESSION: 1. Large hiatal hernia. 2. Esophageal dysmotility. Electronically Signed   By: Kerby Moors M.D.   On: 10/04/2018 15:31    Time Spent in minutes  25   Bonnell Public M.D on 11/02/2018 at 12:48 PM  Between 7am to 7pm - Pager - [phone].  After 7pm go to [email or web address] - password [email or web address]  [facility] -  Office  [phone]

## 2018-11-03 LAB — RENAL FUNCTION PANEL
Albumin: 2.4 g/dL — ABNORMAL LOW (ref 3.5–5.0)
Anion gap: 4 — ABNORMAL LOW (ref 5–15)
BUN: 33 mg/dL — ABNORMAL HIGH (ref 8–23)
CO2: 23 mmol/L (ref 22–32)
Calcium: 9.6 mg/dL (ref 8.9–10.3)
Chloride: 110 mmol/L (ref 98–111)
Creatinine, Ser: 0.81 mg/dL (ref 0.44–1.00)
GFR calc Af Amer: >60 mL/min (ref >60)
GFR calc non Af Amer: >60 mL/min (ref >60)
Glucose, Bld: 95 mg/dL (ref 70–99)
Phosphorus: 3 mg/dL (ref 2.5–4.6)
Potassium: 4.6 mmol/L (ref 3.5–5.1)
Sodium: 137 mmol/L (ref 135–145)

## 2018-11-03 NOTE — Progress Notes (Signed)
PROGRESS NOTE                                                                                                                                                                                                             Patient Demographics:    Rebecca Ford, is a 84 y.o. female, DOB - July 06, 1930, TDV:[id number]  Admit date - 10/24/2018   Admitting Physician [name], MD  Outpatient Primary MD for the patient is [name], MD  LOS - 9  Outpatient Specialists : none  Chief Complaint  Patient presents with  . Allergic Reaction       Brief Narrative   84 year old female with coronary artery disease, hypertension, dyslipidemia, recent hospitalization for decompensated diastolic CHF, failure to thrive was discharged to SNF brought to the ED with increasing confusion, diffuse macular rash.  Per daughter who provided history she does have at least moderate dementia which was not diagnosed until her recent hospitalization.  She has been more forgetful in the recent past.  She was discharged to SNF on Megace recently.  For past 2 days she has noticed diffuse rash in her body with severe itching.  Also complaining of some difficulty swallowing.  No fevers, chills, nausea, vomiting, abdominal pain, shortness of breath, dysuria or diarrhea.  Patient is incontinent and wears diapers. In the ED blood work showed acute kidney injury and leukocytosis.  Admitted for further management.  10/30/2018: Seen alongside patient's daughter, patient's nurse, case manager Librarian, academic) and pharmacist.  Discussed extensively with the patient's daughter.  [name] reports visual hallucination, acute confusion and vague memory situation prior to presentation.  Persistent leukocytosis is noted.  Will repeat chest x-ray.  Will check procalcitonin level.  Rash, presumed to be a drug rash has improved significantly.  No itching.  I explained to  the patient's daughter that it will be difficult to make a diagnosis of dementia during this acute episode.  I also explained to the patient's daughter that assessment and work-up for possible dementing illness will be best pursued by the patient's primary care provider when patient is back to her baseline as current findings could be secondary to delirium/acute confusional state.  Further management will depend on hospital course.  Physical therapy input is appreciated, for skilled nursing facility placement.  Will need to work-up patient's persistent leukocytosis.  May  need ultrasound-guided thoracentesis for pleural fluid analysis.  Overall, patient seems to be improving.  Will gradually wean patient off oral prednisone.  Will start patient on Augmentin as the patient may not be keen on restarting IV access.  10/31/2018: Patient seen.  Patient recalls events of yesterday without much difficulty.  Patient is aware of the day, month and year.  As addressed earlier, further assessment for possible dementing process will be best made when patient is back to her baseline.  Patient is stable for discharge to skilled nursing facility.  Phosphorus is 1.9 today, will replete.  Procalcitonin is less than 0.1, hence, ultrasound guided thoracentesis for pleural fluid analysis was not pursued.  Worthy to mention the patient's albumin is also significantly low.  11/01/2018: Leukocytosis is improving.  Patient is stable for discharge.  Awaiting insurance approval.  Otherwise, no new findings.  11/02/2018: Patient seen alongside patient's friend.  No new complaints.  Leukocytosis is resolving.  Patient is awaiting disposition.  11/03/2017: No new changes.  Patient is awaiting disposition.    Subjective:   No new complaints.  No fever chills.  Patient maintains that she does not have a dementing illness.     Assessment  & Plan :   Diffuse macular skin rash Suspect drug reaction.  Was recently started on Cardizem  and Megace.  Could be contributed by medications.  Discontinued Megace and placed on IV Solu-Medrol, PRN IV Benadryl for itching.  No clinical signs of infection except for possible UTI.  Blood and urine cultures negative. 10/30/2018: There are she is improving.  Will wean patient off prednisone. 10/31/2018: Continue to wean patient off steroids.  Active problems Acute combined metabolic and toxic encephalopathy Possibly worsening of her dementia in the setting of infection (UTI) and dehydration.  Avoid narcotics and benzos.  discontinued amitriptyline and Claritin.  Low-dose PRN Haldol for agitation.  Head CT negative for acute findings.  Chest x-ray negative for infiltrate.   Encephalopathy has improved in the past 24-48 hours. 10/30/2018: Continue to treat reversible causes.  Cannot make the diagnosis of dementing illness in this setting. 10/31/2018: Acute encephalopathy has improved significantly. 11/02/2018: Encephalopathy has resolved.  Pursue disposition.    Acute kidney injury Suspect prerenal with dehydration.  Possible UTI as well.  Improved with hydration.  All fluids and resumed Lasix.  Hold HCTZ and Micardis.  Avoid nephrotoxins. 10/30/2018: Resolved.   UTI On empiric Rocephin.  Cultures negative.    Leukocytosis Continue work-up.   Bilateral pleural effusion noted.   -Will evaluate evaluate in pursuing ultrasound-guided thoracentesis and pleural fluid analysis, if agreeable by patient and patient's daughter. -Will discuss this option for ultrasound-guided thoracentesis and pleural fluid analysis further with the patient and patient's daughter.   -Check procalcitonin level. 10/31/2018: Procalcitonin is less than 0.1.  Continue to wean patient off steroids. 11/01/2018: Leukocytosis is improving.  White count today is 12.6.   Essential hypertension On Cardizem and metoprolol.  Chronic diastolic CHF Recent hospitalization for decompensation 2 weeks back with right pleural  effusion. 10/30/2018: CHF is currently compensated.  No volume overload.  Will hold Lasix for now.  Iron deficiency anemia Received IV iron during last hospitalization.  Severe protein calorie malnutrition On supplement.  Megace held. 10/30/2018: Low albumin-query significance, prognostic versus nutritional.  Overall, suspect guarded prognosis  Generalized weakness Seen by PT/OT and recommend SNF as patient needs a lot of skilled care and daughter unable to take care of her at home.  Code Status : DNR  Family Communication  : Daughter.   Disposition Plan  : SNF.  Barriers For Discharge : Pursue disposition.  Consults  : None  Procedures  : Head CT  DVT Prophylaxis  : Subcu heparin  Lab Results  Component Value Date   PLT 218 11/02/2018    Antibiotics  :   Anti-infectives (From admission, onward)   Start     Dose/Rate Route Frequency Ordered Stop   10/31/18 1000  amoxicillin-clavulanate (AUGMENTIN) 875-125 MG per tablet 1 tablet     1 tablet Oral Every 12 hours 10/31/18 0247 11/03/18 1053   10/25/18 0800  cefTRIAXone (ROCEPHIN) 1 g in sodium chloride 0.9 % 100 mL IVPB  Status:  Discontinued     1 g 200 mL/hr over 30 Minutes Intravenous Every 24 hours 10/25/18 0712 10/30/18 1117        Objective:   Vitals:   11/02/18 1452 11/02/18 2243 11/03/18 0531 11/03/18 1052  BP: 123/60 (!) 149/74 (!) 147/64 (!) 144/77  Pulse: 82 (!) 102 83 (!) 108  Resp: 18 18 16    Temp: (!) 97.4 F (36.3 C) 98.2 F (36.8 C) 97.9 F (36.6 C)   TempSrc:  Oral Oral   SpO2: 98% 99% 98%   Weight:      Height:        Wt Readings from Last 3 Encounters:  10/31/18 60.4 kg  10/09/18 61.4 kg  05/03/17 64.1 kg     Intake/Output Summary (Last 24 hours) at 11/03/2018 1410 Last data filed at 11/03/2018 1255 Gross per 24 hour  Intake -  Output 200 ml  Net -200 ml   Physical exam Not in distress HEENT: Mild pallor. No jaundice. Chest: Clear bilaterally CVs: Normal S1 and  S2 Musculoskeletal: Warm, no edema, rash seems to have improved significantly.  CNS: Awake and alert.  Patient moves all limbs.      Data Review:    CBC Recent Labs  Lab 10/28/18 0505 10/30/18 0447 10/31/18 0258 11/01/18 0342 11/02/18 0420  WBC 16.0* 16.6* 15.3* 12.6* 12.8*  HGB 10.7* 10.5* 10.2* 10.1* 10.6*  HCT 33.8* 32.8* 32.4* 31.9* 33.8*  PLT 244 214 226 199 218  MCV 104.0* 100.6* 102.5* 103.2* 101.8*  MCH 32.9 32.2 32.3 32.7 31.9  MCHC 31.7 32.0 31.5 31.7 31.4  RDW 13.3 13.1 13.2 13.5 13.3  LYMPHSABS  --   --  0.8 0.8 0.8  MONOABS  --   --  1.0 0.9 1.0  EOSABS  --   --  1.1* 0.5 0.8*  BASOSABS  --   --  0.0 0.0 0.0    Chemistries  Recent Labs  Lab 10/30/18 0447 10/31/18 0258 11/01/18 0342 11/02/18 0420 11/03/18 0444  NA 138 138 136 136 137  K 3.1* 4.1 4.3 4.0 4.6  CL 111 110 109 110 110  CO2 21* 21* 22 19* 23  GLUCOSE 83 101* 91 83 95  BUN 38* 35* 30* 28* 33*  CREATININE 0.80 0.81 0.75 0.80 0.81  CALCIUM 9.5 9.2 9.4 9.3 9.6  MG  --  1.8  --   --   --   AST  --  35  --   --   --   ALT  --  28  --   --   --   ALKPHOS  --  111  --   --   --   BILITOT  --  0.6  --   --   --    ------------------------------------------------------------------------------------------------------------------ No results for  input(s): CHOL, HDL, LDLCALC, TRIG, CHOLHDL, LDLDIRECT in the last 72 hours.  No results found for: HGBA1C ------------------------------------------------------------------------------------------------------------------ No results for input(s): TSH, T4TOTAL, T3FREE, THYROIDAB in the last 72 hours.  Invalid input(s): FREET3 ------------------------------------------------------------------------------------------------------------------ No results for input(s): VITAMINB12, FOLATE, FERRITIN, TIBC, IRON, RETICCTPCT in the last 72 hours.  Coagulation profile Recent Labs  Lab 10/31/18 0258  INR 1.24    No results for input(s): DDIMER in the last 72  hours.  Cardiac Enzymes No results for input(s): CKMB, TROPONINI, MYOGLOBIN in the last 168 hours.  Invalid input(s): CK ------------------------------------------------------------------------------------------------------------------    Component Value Date/Time   BNP 109.8 (H) 10/01/2018 1700    Inpatient Medications  Scheduled Meds: . aspirin EC  81 mg Oral Daily  . atorvastatin  40 mg Oral QHS  . diltiazem  30 mg Oral Q12H  . feeding supplement (PRO-STAT SUGAR FREE 64)  30 mL Oral BID  . heparin  5,000 Units Subcutaneous Q12H  . hydrocortisone cream   Topical TID  . metoprolol tartrate  50 mg Oral BID  . pantoprazole  40 mg Oral BID  . [START ON 11/04/2018] predniSONE  10 mg Oral Q breakfast   Continuous Infusions:  PRN Meds:.acetaminophen **OR** acetaminophen, diphenhydrAMINE, haloperidol lactate, magic mouthwash w/lidocaine, nitroGLYCERIN, phenol, polyethylene glycol  Micro Results Recent Results (from the past 240 hour(s))  MRSA PCR Screening     Status: None   Collection Time: 10/25/18  2:18 AM  Result Value Ref Range Status   MRSA by PCR NEGATIVE NEGATIVE Final    Comment:        The GeneXpert MRSA Assay (FDA approved for NASAL specimens only), is one component of a comprehensive MRSA colonization surveillance program. It is not intended to diagnose MRSA infection nor to guide or monitor treatment for MRSA infections. Performed at [facility], [place], [place] [address], [place], [place] [address]   Urine Culture     Status: None   Collection Time: 10/25/18  3:08 AM  Result Value Ref Range Status   Specimen Description   Final    URINE, RANDOM Performed at [place] [address] [place], [place] [address]    Special Requests   Final    NONE Performed at [facility], [place] [address] [place], [place] [address]    Culture   Final    Multiple bacterial morphotypes present, none predominant. Suggest  appropriate recollection if clinically indicated.   Report Status 10/26/2018 FINAL  Final  Culture, blood (routine x 2)     Status: None   Collection Time: 10/25/18  3:58 PM  Result Value Ref Range Status   Specimen Description   Final    BLOOD LEFT ARM Performed at [place] [address], [place], [place] [address]    Special Requests   Final    BOTTLES DRAWN AEROBIC AND ANAEROBIC Blood Culture adequate volume Performed at [place] [address] [place], [place] [address]    Culture   Final    NO GROWTH 5 DAYS Performed at [facility], [place] [address], [place],  [address]    Report Status 10/30/2018 FINAL  Final  Culture, blood (routine x 2)     Status: None   Collection Time: 10/25/18  4:06 PM  Result Value Ref Range Status   Specimen Description   Final    BLOOD LEFT ARM Performed at [place] [address] [place], [place]  [address]    Special Requests   Final    BOTTLES DRAWN AEROBIC AND ANAEROBIC Blood Culture adequate volume Performed at [place] [address] [place], [place] [address]    Culture   Final    NO GROWTH 5 DAYS Performed at [facility], [place] [address], [place], [place] [address]    Report Status 10/30/2018 FINAL  Final    Radiology Reports Dg Chest 2 View  Result Date: 10/30/2018 CLINICAL DATA:  Pneumonia EXAM: CHEST - 2 VIEW COMPARISON:  10/24/2018 FINDINGS: Low lung volumes. The patient's chin overlies the lung apices on the frontal projection. Heart size is top-normal. There is minimal aortic atherosclerosis at the arch. Stable small right and new small left pleural effusions with adjacent atelectasis. Air-fluid level within a small hiatal hernia is again noted. No acute nor suspicious osseous. Mild degenerative change along the thoracic spine IMPRESSION: Stable small right pleural effusion with atelectasis. New small left pleural  effusion with atelectasis. No alveolar consolidation conclusive for pneumonia. Stable small hiatal hernia. Electronically Signed   By: Ashley Royalty M.D.   On: 10/30/2018 15:04   Dg Chest 2 View  Result Date: 10/24/2018 CLINICAL DATA:  84 y/o  F; progressive rash all over the body. EXAM: CHEST - 2 VIEW COMPARISON:  10/01/2018 chest radiograph. 10/04/2018 esophagram. FINDINGS: Stable cardiac silhouette given projection and technique. Stable small right-sided pleural effusion and basilar opacity. No new consolidation. No pneumothorax. Stable hiatal hernia. No acute osseous abnormality is evident. IMPRESSION: Stable small right-sided pleural effusion and basilar opacity. Stable hiatal hernia. No new consolidation. Electronically Signed   By: Kristine Garbe M.D.   On: 10/24/2018 22:01   Ct Head Wo Contrast  Result Date: 10/24/2018 CLINICAL DATA:  Acute onset of altered mental status. Confusion and hallucinations. Found down. EXAM: CT HEAD WITHOUT CONTRAST TECHNIQUE: Contiguous axial images were obtained from the base of the skull through the vertex without intravenous contrast. COMPARISON:  None. FINDINGS: Brain: No evidence of acute infarction, hemorrhage, hydrocephalus, extra-axial collection or mass lesion / mass effect. Evaluation is somewhat suboptimal due to motion artifact at the vertex. Prominence of the ventricles and sulci reflects mild cortical volume loss. Scattered periventricular and subcortical white matter change likely reflects small vessel ischemic microangiopathy. The brainstem and fourth ventricle are within normal limits. The basal ganglia are unremarkable in appearance. The cerebral hemispheres demonstrate grossly normal gray-white differentiation. No mass effect or midline shift is seen. Vascular: No hyperdense vessel or unexpected calcification. Skull: There is no evidence of fracture; visualized osseous structures are unremarkable in appearance. Sinuses/Orbits: The orbits are  within normal limits. The paranasal sinuses and mastoid air cells are well-aerated. Other: No significant soft tissue abnormalities are seen. IMPRESSION: 1. No acute intracranial pathology seen on CT. 2. Mild cortical volume loss and scattered small vessel ischemic microangiopathy. Electronically Signed   By: Garald Balding M.D.   On: 10/24/2018 22:24   Dg Esophagus  Result Date: 10/04/2018 CLINICAL DATA:  Difficulty swallowing. EXAM: ESOPHOGRAM/BARIUM SWALLOW TECHNIQUE: Single contrast examination was performed using thin barium. A barium tablet was also administered FLUOROSCOPY TIME:  Fluoroscopy Time:  2.9 minutes Radiation Exposure Index (if provided by the fluoroscopic device): 55.9 mGy Number of Acquired Spot Images: 3 COMPARISON:  None. FINDINGS: The proximal and mid esophagus are widely patent. No stricture or mass. The motility of the esophagus is abnormal with multiple disorganized tertiary waves. A large hiatal hernia is identified. This contains approximately 20% intrathoracic stomach.  Transient narrowing of the distal esophagus at the level of the GE junction noted which resulted and attenuation of tablet transit into the intrathoracic portions of the stomach. After several swallows of thin barium and water the tablet eventually passed into the stomach. Mild reflux identified. IMPRESSION: 1. Large hiatal hernia. 2. Esophageal dysmotility. Electronically Signed   By: Kerby Moors M.D.   On: 10/04/2018 15:31    Time Spent in minutes  25   Bonnell Public M.D on 11/03/2018 at 2:10 PM  Between 7am to 7pm - Pager - [phone].  After 7pm go to [email or web address] - password [facility]  [facility] -  Office  [phone]

## 2018-11-04 DIAGNOSIS — M6281 Muscle weakness (generalized): Secondary | ICD-10-CM | POA: Diagnosis not present

## 2018-11-04 DIAGNOSIS — I10 Essential (primary) hypertension: Secondary | ICD-10-CM | POA: Diagnosis not present

## 2018-11-04 DIAGNOSIS — I2510 Atherosclerotic heart disease of native coronary artery without angina pectoris: Secondary | ICD-10-CM | POA: Diagnosis not present

## 2018-11-04 DIAGNOSIS — M2550 Pain in unspecified joint: Secondary | ICD-10-CM | POA: Diagnosis not present

## 2018-11-04 DIAGNOSIS — G9340 Encephalopathy, unspecified: Secondary | ICD-10-CM | POA: Diagnosis not present

## 2018-11-04 DIAGNOSIS — F419 Anxiety disorder, unspecified: Secondary | ICD-10-CM | POA: Diagnosis not present

## 2018-11-04 DIAGNOSIS — E43 Unspecified severe protein-calorie malnutrition: Secondary | ICD-10-CM | POA: Diagnosis not present

## 2018-11-04 DIAGNOSIS — I5032 Chronic diastolic (congestive) heart failure: Secondary | ICD-10-CM | POA: Diagnosis not present

## 2018-11-04 DIAGNOSIS — G92 Toxic encephalopathy: Secondary | ICD-10-CM | POA: Diagnosis not present

## 2018-11-04 DIAGNOSIS — E860 Dehydration: Secondary | ICD-10-CM | POA: Diagnosis not present

## 2018-11-04 DIAGNOSIS — R410 Disorientation, unspecified: Secondary | ICD-10-CM | POA: Diagnosis not present

## 2018-11-04 DIAGNOSIS — L299 Pruritus, unspecified: Secondary | ICD-10-CM | POA: Diagnosis not present

## 2018-11-04 DIAGNOSIS — G9341 Metabolic encephalopathy: Secondary | ICD-10-CM | POA: Diagnosis not present

## 2018-11-04 DIAGNOSIS — F05 Delirium due to known physiological condition: Secondary | ICD-10-CM | POA: Diagnosis not present

## 2018-11-04 DIAGNOSIS — F0390 Unspecified dementia without behavioral disturbance: Secondary | ICD-10-CM | POA: Diagnosis not present

## 2018-11-04 DIAGNOSIS — G4700 Insomnia, unspecified: Secondary | ICD-10-CM | POA: Diagnosis not present

## 2018-11-04 DIAGNOSIS — N179 Acute kidney failure, unspecified: Secondary | ICD-10-CM | POA: Diagnosis not present

## 2018-11-04 DIAGNOSIS — D72829 Elevated white blood cell count, unspecified: Secondary | ICD-10-CM | POA: Diagnosis not present

## 2018-11-04 DIAGNOSIS — Z7401 Bed confinement status: Secondary | ICD-10-CM | POA: Diagnosis not present

## 2018-11-04 DIAGNOSIS — R21 Rash and other nonspecific skin eruption: Secondary | ICD-10-CM | POA: Diagnosis not present

## 2018-11-04 NOTE — Progress Notes (Signed)
Occupational Therapy Treatment Patient Details Name: Rebecca Ford MRN: [id number] DOB: 28-Feb-1930 Today's Date: 11/04/2018    History of present illness 84 year old female with coronary artery disease, hypertension, dyslipidemia, OA, MI, cataracts and recent hospitalization for decompensated diastolic CHF, failure to thrive was discharged to SNF brought to the ED with increasing confusion, diffuse macular rash   OT comments    Follow Up Recommendations  SNF;Supervision/Assistance - 24 hour    Equipment Recommendations  Other (comment)(TBD at next venue of care)    Recommendations for Other Services PT consult    Precautions / Restrictions Precautions Precautions: Fall       Mobility Bed Mobility               General bed mobility comments: pt in chair  Transfers Overall transfer level: Needs assistance Equipment used: Rolling walker (2 wheeled)   Sit to Stand: Min guard Stand pivot transfers: Min guard       General transfer comment: Min guard provided for safety, cues for UE assist        ADL either performed or assessed with clinical judgement   ADL Overall ADL's : Needs assistance/impaired                 Upper Body Dressing : Set up;Sitting   Lower Body Dressing: Minimal assistance;Sit to/from stand;Cueing for safety;Cueing for sequencing   Toilet Transfer: Stand-pivot;Minimal assistance;Ambulation   Toileting- Clothing Manipulation and Hygiene: Sit to/from stand;Cueing for safety;Minimal assistance               Vision Patient Visual Report: No change from baseline            Cognition Arousal/Alertness: Awake/alert Behavior During Therapy: WFL for tasks assessed/performed Overall Cognitive Status: Impaired/Different from baseline Area of Impairment: Memory;Safety/judgement;Following commands                     Memory: Decreased short-term memory Following Commands: Follows one step commands with increased  time Safety/Judgement: Decreased awareness of safety;Decreased awareness of deficits                         Pertinent Vitals/ Pain       Pain Assessment: No/denies pain         Frequency  Min 2X/week        Progress Toward Goals  OT Goals(current goals can now be found in the care plan section)  Progress towards OT goals: Progressing toward goals     Plan Discharge plan remains appropriate    Co-evaluation                 AM-PAC OT "6 Clicks" Daily Activity     Outcome Measure   Help from another person eating meals?: None Help from another person taking care of personal grooming?: None Help from another person toileting, which includes using toliet, bedpan, or urinal?: A Little Help from another person bathing (including washing, rinsing, drying)?: A Little Help from another person to put on and taking off regular upper body clothing?: A Little Help from another person to put on and taking off regular lower body clothing?: A Little 6 Click Score: 20    End of Session Equipment Utilized During Treatment: Gait belt  OT Visit Diagnosis: Unsteadiness on feet (R26.81);Other symptoms and signs involving cognitive function;Muscle weakness (generalized) (M62.81)   Activity Tolerance Patient tolerated treatment well   Patient Left with call bell/phone within reach;with bed alarm set;in  chair   Nurse Communication Mobility status        Time: [phone] OT Time Calculation (min): 10 min  Charges: OT General Charges $OT Visit: 1 Visit OT Treatments $Self Care/Home Management : 8-22 mins  Kari Baars, OT Acute Rehabilitation Services [phone] Office- [phone], Edwena Felty D 11/04/2018, 5:01 PM

## 2018-11-04 NOTE — Progress Notes (Signed)
Physical Therapy Treatment Patient Details Name: Rebecca Ford MRN: [id number] DOB: [birth year] Today's Date: 11/04/2018    History of Present Illness 84 year old female with coronary artery disease, hypertension, dyslipidemia, OA, MI, cataracts and recent hospitalization for decompensated diastolic CHF, failure to thrive was discharged to SNF brought to the ED with increasing confusion, diffuse macular rash    PT Comments    Pt requesting bathroom prior to ambulation so assisted pt to bathroom.  Pt did not use RW and was holding onto objects in room for UE support.  RW provided for hallway ambulation with improved steadiness however cues for safe use of RW required.   Follow Up Recommendations  SNF;Supervision/Assistance - 24 hour(vs more care in ALF or ILF)     Equipment Recommendations  None recommended by PT    Recommendations for Other Services       Precautions / Restrictions Precautions Precautions: Fall Restrictions Weight Bearing Restrictions: No    Mobility  Bed Mobility Overal bed mobility: Needs Assistance Bed Mobility: Supine to Sit     Supine to sit: Min guard;HOB elevated     General bed mobility comments: Min guard for safety. Pt with increased time and effort.   Transfers Overall transfer level: Needs assistance Equipment used: Rolling walker (2 wheeled) Transfers: Sit to/from Stand Sit to Stand: Min guard         General transfer comment: Min guard provided for safety, cues for UE assist  Ambulation/Gait Ambulation/Gait assistance: Min assist Gait Distance (Feet): 120 Feet Assistive device: Rolling walker (2 wheeled) Gait Pattern/deviations: Step-through pattern;Decreased stride length;Trunk flexed     General Gait Details: Repeated verbal cuing for safe use of RW, stepping into RW   Stairs             Wheelchair Mobility    Modified Rankin (Stroke Patients Only)       Balance           Standing balance support:  Bilateral upper extremity supported Standing balance-Leahy Scale: Poor Standing balance comment: requires UE support                            Cognition Arousal/Alertness: Awake/alert Behavior During Therapy: WFL for tasks assessed/performed Overall Cognitive Status: Impaired/Different from baseline Area of Impairment: Memory;Safety/judgement;Following commands                     Memory: Decreased short-term memory Following Commands: Follows one step commands with increased time Safety/Judgement: Decreased awareness of safety;Decreased awareness of deficits            Exercises      General Comments        Pertinent Vitals/Pain Pain Assessment: No/denies pain    Home Living                      Prior Function            PT Goals (current goals can now be found in the care plan section) Progress towards PT goals: Progressing toward goals    Frequency    Min 2X/week      PT Plan Current plan remains appropriate    Co-evaluation              AM-PAC PT "6 Clicks" Mobility   Outcome Measure  Help needed turning from your back to your side while in a flat bed without using bedrails?: A Lot Help needed moving  from lying on your back to sitting on the side of a flat bed without using bedrails?: A Lot Help needed moving to and from a bed to a chair (including a wheelchair)?: A Lot Help needed standing up from a chair using your arms (e.g., wheelchair or bedside chair)?: A Little Help needed to walk in hospital room?: A Little Help needed climbing 3-5 steps with a railing? : A Lot 6 Click Score: 14    End of Session Equipment Utilized During Treatment: Gait belt Activity Tolerance: Patient tolerated treatment well Patient left: in chair;with chair alarm set;with call bell/phone within reach   PT Visit Diagnosis: Difficulty in walking, not elsewhere classified (R26.2);Muscle weakness (generalized) (M62.81)     Time:  6067-7034 PT Time Calculation (min) (ACUTE ONLY): 14 min  Charges:  $Gait Training: 8-22 mins                    Carmelia Bake, PT, DPT Acute Rehabilitation Services Office: [phone] Pager: [phone]  Trena Platt 11/04/2018, 12:19 PM

## 2018-11-04 NOTE — Care Management Important Message (Signed)
Important Message  Patient Details  Name: Rebecca Ford MRN: [id number] Date of Birth: 1929-12-25   Medicare Important Message Given:  Yes    Kerin Salen 11/04/2018, 12:59 [place] Message  Patient Details  Name: Rebecca Ford MRN: [id number] Date of Birth: [birth year]   Medicare Important Message Given:  Yes    Kerin Salen 11/04/2018, 12:59 PM

## 2018-11-04 NOTE — Clinical Social Work Placement (Signed)
Pt admitting to [facility] room 102-B. Report [phone] Arranged PTAR transportation- daughter at bedside today agreeable to plan. DC information provided and facility received approved [facility] authorization for pt to admit.    CLINICAL SOCIAL WORK PLACEMENT  NOTE  Date:  11/04/2018  Patient Details  Name: Rebecca Ford MRN: [id number] Date of Birth: 1930-06-15  Clinical Social Work is seeking post-discharge placement for this patient at the [place] level of care (*CSW will initial, date and re-position this form in  chart as items are completed):  Yes   Patient/family provided with [place] Work Department's list of facilities offering this level of care within the geographic area requested by the patient (or if unable, by the patient's family).  Yes   Patient/family informed of their freedom to choose among providers that offer the needed level of care, that participate in Medicare, Medicaid or managed care program needed by the patient, have an available bed and are willing to accept the patient.  Yes   Patient/family informed of [facility]'s ownership interest in [facility] and [facility], as well as of the fact that they are under no obligation to receive care at these facilities.  PASRR submitted to EDS on       PASRR number received on       Existing PASRR number confirmed on       FL2 transmitted to all facilities in geographic area requested by pt/family on 10/28/18     FL2 transmitted to all facilities within larger geographic area on       Patient informed that his/her managed care company has contracts with or will negotiate with certain facilities, including the following:        Yes   Patient/family informed of bed offers received.  Patient chooses bed at [facility]     Physician recommends and patient chooses bed at [facility]    Patient to be transferred to [facility] on 11/04/18.  Patient to be transferred to facility by PTAR     Patient family notified on 11/04/18 of transfer.  Name of family member notified:  daughter Abigail Butts     PHYSICIAN       Additional Comment:    _______________________________________________ Nila Nephew, LCSW 11/04/2018, 2:27 PM [phone]

## 2018-11-04 NOTE — Progress Notes (Signed)
[facility] received authorization from [facility] for pt to admit. Will coordinate transition there once pt ready for DC.  Sharren Bridge, MSW, LCSW Clinical Social Work 11/04/2018 [phone]

## 2018-11-04 NOTE — Discharge Summary (Signed)
Physician Discharge Summary  Patient ID: Rebecca Ford MRN: [id number] DOB/AGE: 82-30-1931 82 y.o.  Admit date: 10/24/2018 Discharge date: 11/04/2018  Admission Diagnoses:  Discharge Diagnoses:    Acute combined toxic and metabolic encephalopathy.   Volume depletion.   Acute kidney injury   Possible UTI   HYPERTENSION, BENIGN   CAD, NATIVE VESSEL   CHF (congestive heart failure) (HCC)   Rash and nonspecific skin eruption   Confusion   Discharged Condition: stable  Hospital Course: Patient is an 84 year old female with past medical history significant for coronary artery disease, hypertension, dyslipidemia, recent hospitalization for decompensated diastolic CHF, and failure to thrive.  Patient was was discharged to SNF for rehabilitation.  Patient was readmitted with increasing confusion, diffuse macular rash, which is thought to be drug induced rash.  Apparently, on last discharge, patient was started on Megace, which is been considered as a possible culprit.  On presentation, patient endorsed 2-day history of diffuse rash, with severe itching.  Work-up done on presentation also revealed acute kidney injury and leukocytosis.    Patient was admitted for further assessment and management.  Diffuse macular skin rash: Suspect drug reaction.    Patient was recently started on Cardizem and Megace.   Megace was discontinued.   Patient was initially managed with IV Solu-Medrol, and as needed Benadryl.   IV Solu-Medrol was transitioned to oral prednisone, and prednisone has been weaned off.   Rash has improved significantly.    Acute combined metabolic and toxic encephalopathy: Etiology of encephalopathy is likely multifactorial.   Acute kidney injury was noted on presentation, likely prerenal.   Patient also had rash.   Patient was volume depleted.   Acute encephalopathy has resolved.   [name] was initially concerned about possible dementing process.   Possibility of  dementing process can be assessed further by the primary care provider.   For now, patient is deemed to have had acute confusional state/delirium.    Acute kidney injury Suspect prerenal with volume depletion.   Acute kidney injury has resolved.    Possible UTI: Urine culture grew multiple organisms.   Patient was treated with IV Rocephin during the hospital stay.      Leukocytosis: Likely multifactorial. WBC is on the downward trend. CP will continue to monitor. Bilateral pleural effusion noted.   Procalcitonin level was less than 0.1.  Essential hypertension On Cardizem and metoprolol.  Chronic diastolic CHF Patient was recently hospitalized for decompensation 2 weeks ago with right pleural effusion. CHF is currently compensated.   No volume overload.    Iron deficiency anemia Received IV iron during last hospitalization.  Severe protein calorie malnutrition On supplement.  Megace held.  Generalized weakness Seen by PT/OT and recommend SNF   Consults: None  Discharge Exam: Blood pressure (!) 117/56, pulse 76, temperature 97.9 F (36.6 C), temperature source Oral, resp. rate (!) 26, height 5\' 1"  (1.549 m), weight 60.4 kg, SpO2 96 %.   Disposition: Discharge disposition: 03-Skilled Nursing Facility  Discharge Instructions    Diet - low sodium heart healthy   Complete by:  As directed    Increase activity slowly   Complete by:  As directed      Allergies as of 11/04/2018      Reactions   Codeine Other (See Comments)   Makes her severely depressed       Medication List    STOP taking these medications   amitriptyline 100 MG tablet Commonly known as:  ELAVIL   fish  oil-omega-3 fatty acids 1000 MG capsule   furosemide 20 MG tablet Commonly known as:  LASIX   hydrochlorothiazide 25 MG tablet Commonly known as:  HYDRODIURIL   hydrOXYzine 25 MG tablet Commonly known as:  ATARAX/VISTARIL   megestrol 400 MG/10ML suspension Commonly known as:   MEGACE   potassium chloride SA 20 MEQ tablet Commonly known as:  K-DUR,KLOR-CON   telmisartan 40 MG tablet Commonly known as:  MICARDIS   VITAMIN D-3 PO     TAKE these medications   acetaminophen 325 MG tablet Commonly known as:  TYLENOL Take 650 mg by mouth every 6 (six) hours as needed for mild pain.   alendronate 70 MG tablet Commonly known as:  FOSAMAX Take 70 mg by mouth once a week.   aspirin EC 81 MG tablet Take 1 tablet (81 mg total) by mouth daily.   atorvastatin 80 MG tablet Commonly known as:  LIPITOR Take 80 mg by mouth at bedtime.   cetirizine 10 MG tablet Commonly known as:  ZYRTEC Take 10 mg by mouth daily.   diltiazem 30 MG tablet Commonly known as:  CARDIZEM Take 1 tablet (30 mg total) by mouth every 12 (twelve) hours.   feeding supplement (PRO-STAT SUGAR FREE 64) Liqd Take 30 mLs by mouth 2 (two) times daily.   IRON 27 PO Take 1 tablet by mouth daily.   metoprolol tartrate 50 MG tablet Commonly known as:  LOPRESSOR Take 50 mg by mouth 2 (two) times daily.   nitroGLYCERIN 0.4 MG SL tablet Commonly known as:  NITROSTAT Place 1 tablet (0.4 mg total) under the tongue every 5 (five) minutes as needed for chest pain.   pantoprazole 40 MG tablet Commonly known as:  PROTONIX Take 1 tablet (40 mg total) by mouth 2 (two) times daily.   polyethylene glycol packet Commonly known as:  MIRALAX / GLYCOLAX Take 17 g by mouth daily.   PRESERVISION AREDS PO Take 1 tablet by mouth 2 (two) times daily.      Time spent-32 minutes.  SignedBonnell Public 11/04/2018, 1:46 PM

## 2018-11-04 NOTE — Progress Notes (Signed)
Attempted x 2 to call report at [phone] [facility]), first time was placed on hold over 5 mins and had to hang up to tend to another patient, second attempt was disconnected, PTAR to transport to facility

## 2018-11-05 NOTE — Consult Note (Signed)
   [facility] Inpatient Consult   11/05/2018  Rebecca Ford 07-11-1930 [id number]    [place] Management hospital liaison follow up.  Chart reviewed. [name] discharged to [facility] SNF. There were no identifiable [facility] Care Management needs.    Marthenia Rolling, MSN-Ed, RN,BSN [facility] Liaison [phone]

## 2018-11-06 DIAGNOSIS — G4700 Insomnia, unspecified: Secondary | ICD-10-CM | POA: Diagnosis not present

## 2018-11-06 DIAGNOSIS — I5032 Chronic diastolic (congestive) heart failure: Secondary | ICD-10-CM | POA: Diagnosis not present

## 2018-11-06 DIAGNOSIS — F419 Anxiety disorder, unspecified: Secondary | ICD-10-CM | POA: Diagnosis not present

## 2018-11-06 DIAGNOSIS — G9341 Metabolic encephalopathy: Secondary | ICD-10-CM | POA: Diagnosis not present

## 2018-11-06 DIAGNOSIS — F05 Delirium due to known physiological condition: Secondary | ICD-10-CM | POA: Diagnosis not present

## 2018-11-12 DIAGNOSIS — G9340 Encephalopathy, unspecified: Secondary | ICD-10-CM | POA: Diagnosis not present

## 2018-11-12 DIAGNOSIS — I10 Essential (primary) hypertension: Secondary | ICD-10-CM | POA: Diagnosis not present

## 2018-11-12 DIAGNOSIS — I2510 Atherosclerotic heart disease of native coronary artery without angina pectoris: Secondary | ICD-10-CM | POA: Diagnosis not present

## 2018-11-12 DIAGNOSIS — I5032 Chronic diastolic (congestive) heart failure: Secondary | ICD-10-CM | POA: Diagnosis not present

## 2018-11-13 DIAGNOSIS — L299 Pruritus, unspecified: Secondary | ICD-10-CM | POA: Diagnosis not present

## 2018-11-13 DIAGNOSIS — R21 Rash and other nonspecific skin eruption: Secondary | ICD-10-CM | POA: Diagnosis not present

## 2018-11-13 DIAGNOSIS — G9341 Metabolic encephalopathy: Secondary | ICD-10-CM | POA: Diagnosis not present

## 2018-11-13 DIAGNOSIS — F05 Delirium due to known physiological condition: Secondary | ICD-10-CM | POA: Diagnosis not present

## 2018-11-13 DIAGNOSIS — G4700 Insomnia, unspecified: Secondary | ICD-10-CM | POA: Diagnosis not present

## 2018-11-20 DIAGNOSIS — R21 Rash and other nonspecific skin eruption: Secondary | ICD-10-CM | POA: Diagnosis not present

## 2018-11-20 DIAGNOSIS — F05 Delirium due to known physiological condition: Secondary | ICD-10-CM | POA: Diagnosis not present

## 2018-11-20 DIAGNOSIS — F0390 Unspecified dementia without behavioral disturbance: Secondary | ICD-10-CM | POA: Diagnosis not present

## 2018-11-20 DIAGNOSIS — L299 Pruritus, unspecified: Secondary | ICD-10-CM | POA: Diagnosis not present

## 2018-11-21 DIAGNOSIS — F0390 Unspecified dementia without behavioral disturbance: Secondary | ICD-10-CM | POA: Diagnosis not present

## 2018-11-21 DIAGNOSIS — G92 Toxic encephalopathy: Secondary | ICD-10-CM | POA: Diagnosis not present

## 2018-11-21 DIAGNOSIS — G9341 Metabolic encephalopathy: Secondary | ICD-10-CM | POA: Diagnosis not present

## 2018-11-21 DIAGNOSIS — R21 Rash and other nonspecific skin eruption: Secondary | ICD-10-CM | POA: Diagnosis not present

## 2018-11-21 DIAGNOSIS — F419 Anxiety disorder, unspecified: Secondary | ICD-10-CM | POA: Diagnosis not present

## 2018-11-21 DIAGNOSIS — M6281 Muscle weakness (generalized): Secondary | ICD-10-CM | POA: Diagnosis not present

## 2018-11-21 DIAGNOSIS — I5032 Chronic diastolic (congestive) heart failure: Secondary | ICD-10-CM | POA: Diagnosis not present

## 2018-12-02 DIAGNOSIS — I10 Essential (primary) hypertension: Secondary | ICD-10-CM | POA: Diagnosis not present

## 2018-12-02 DIAGNOSIS — F0390 Unspecified dementia without behavioral disturbance: Secondary | ICD-10-CM | POA: Diagnosis not present

## 2018-12-02 DIAGNOSIS — R627 Adult failure to thrive: Secondary | ICD-10-CM | POA: Diagnosis not present

## 2018-12-02 DIAGNOSIS — D509 Iron deficiency anemia, unspecified: Secondary | ICD-10-CM | POA: Diagnosis not present

## 2018-12-02 DIAGNOSIS — R197 Diarrhea, unspecified: Secondary | ICD-10-CM | POA: Diagnosis not present

## 2018-12-10 ENCOUNTER — Telehealth: Payer: Self-pay

## 2018-12-10 ENCOUNTER — Non-Acute Institutional Stay: Payer: Medicare HMO | Admitting: Nurse Practitioner

## 2018-12-10 DIAGNOSIS — R531 Weakness: Secondary | ICD-10-CM

## 2018-12-10 DIAGNOSIS — R0602 Shortness of breath: Secondary | ICD-10-CM

## 2018-12-10 DIAGNOSIS — R600 Localized edema: Secondary | ICD-10-CM | POA: Diagnosis not present

## 2018-12-10 DIAGNOSIS — R296 Repeated falls: Secondary | ICD-10-CM | POA: Diagnosis not present

## 2018-12-10 DIAGNOSIS — R269 Unspecified abnormalities of gait and mobility: Secondary | ICD-10-CM

## 2018-12-10 DIAGNOSIS — Z515 Encounter for palliative care: Secondary | ICD-10-CM | POA: Diagnosis not present

## 2018-12-10 NOTE — Progress Notes (Signed)
[facility] Telephone: [phone] Fax: [phone]  PATIENT NAME: Rebecca Ford DOB: 06/19/1930 MRN: [id number]  PRIMARY CARE PROVIDER:   Patient, No Pcp Per  REFERRING PROVIDER:  [name], MD [address] [place], [place] [address]  RESPONSIBLE PARTY:  Kristen Loader (daughter) [phone]   [phone]  HISTORY OF PRESENT ILLNESS:  ELIORA NIENHUIS is a 84 y.o. year old female with multiple medical problems including CAD, HTN, dyslipidemia, recent hospitalization fo decompensated diastolic CHF and failure to thrive. Palliative Care was asked to help with symptom management and to  address goals of care.    CC: following multiple calls made by daughter to PCP; PCP requested emergent visit to see patient due to acute decline in patients health.functioning.  On arrival patient was in the dining room eating lunch; she was able to ambulate to her room with her rolling walker with no acute shortness of breath. Patient's only complaint was of swelling of her left left and requesting a wheelchair to use when she is too weak to use rolling walker. Patient denied shortness of breath, chest pain, no change in appetite , denies pain.    ASSESSMENT/PLAN/RECOMMENDATIONS Left lower ext edema  -chronic diastolic CHF -CAD -HTN -patient does not appear volume overloaded; no shortness of breath; lungs are clear and RLL w/o edema -continue current medication regimen; furosemide; diltiazem,asa,metoprolol,PRN NTG   -Left leg venous doppler ordered  Gait disturbance -patient currently uses rolling walker at times she is too weak to use -ordered wheelchair  -macrocytic anemia -GERD -Failure to thrive -ferrous sulfate -MVI -pantoprazole -patient reports good appetite ; no weight loss -continue current regimen  -ACP -TBD    I spent 60 minutes providing this consultation,  from 11:45 to 12:45 . More than 50% of the time in this consultation was spent coordinating  communication.    CODE STATUS: TBD  PPS: 60% HOSPICE ELIGIBILITY/DIAGNOSIS: TBD  PAST MEDICAL HISTORY:  Past Medical History:  Diagnosis Date  . Arthritis   . CAD (coronary artery disease)    s/p cath 09/07/10 w/3 vessel CAD, DES in RCA, Diagonal & LAD  . Dementia ([place])   . HTN (hypertension)   . Hyperlipemia   . MI (myocardial infarction) ([place])    Non-ST elevation 9/11  . Primary osteoarthritis of right knee 09/2016    SOCIAL HX:  Social History   Tobacco Use  . Smoking status: Never Smoker  . Smokeless tobacco: Never Used  Substance Use Topics  . Alcohol use: No    ALLERGIES:  Allergies  Allergen Reactions  . Codeine Other (See Comments)    Makes her severely depressed      PERTINENT MEDICATIONS:  Outpatient Encounter Medications as of 12/10/2018  Medication Sig  . acetaminophen (TYLENOL) 325 MG tablet Take 650 mg by mouth every 6 (six) hours as needed for mild pain.  Marland Kitchen alendronate (FOSAMAX) 70 MG tablet Take 70 mg by mouth once a week.  Marland Kitchen aspirin EC 81 MG tablet Take 1 tablet (81 mg total) by mouth daily.  Marland Kitchen atorvastatin (LIPITOR) 80 MG tablet Take 80 mg by mouth at bedtime.   . cetirizine (ZYRTEC) 10 MG tablet Take 10 mg by mouth daily.  Marland Kitchen diltiazem (CARDIZEM) 30 MG tablet Take 1 tablet (30 mg total) by mouth every 12 (twelve) hours.  . Ferrous Gluconate (IRON 27 PO) Take 1 tablet by mouth daily.  . metoprolol (LOPRESSOR) 50 MG tablet Take 50 mg by mouth 2 (two) times daily.   Marland Kitchen  Multiple Vitamins-Minerals (PRESERVISION AREDS PO) Take 1 tablet by mouth 2 (two) times daily.   . nitroGLYCERIN (NITROSTAT) 0.4 MG SL tablet Place 1 tablet (0.4 mg total) under the tongue every 5 (five) minutes as needed for chest pain.  . pantoprazole (PROTONIX) 40 MG tablet Take 1 tablet (40 mg total) by mouth 2 (two) times daily.  . polyethylene glycol (MIRALAX / GLYCOLAX) packet Take 17 g by mouth daily.   No facility-administered encounter medications on file as of 12/10/2018.      PHYSICAL EXAM:   General: NAD, WD,WN Cardiovascular: regular rate and rhythm Pulmonary: clear ant fields Abdomen: soft, nontender, + bowel sounds GU: no suprapubic tenderness Extremities: LLE 2+ pitting edema ; no edema of RLLE Skin: no rashes of exposed skin Neurological: nonfocal  Dequon Schnebly G [place], NP

## 2018-12-10 NOTE — Telephone Encounter (Signed)
Received a phone call from [facility] at Dr. Noland Fordyce office to request a visit be made today as a patient is in a rapid decline. Patient is no longer getting out of bed. She is dependent in all ADLs except for feeding. Visit scheduled for today

## 2018-12-11 ENCOUNTER — Inpatient Hospital Stay (HOSPITAL_COMMUNITY)
Admission: EM | Admit: 2018-12-11 | Discharge: 2018-12-16 | DRG: 177 | Disposition: A | Discharge status: Hospice | Payer: Medicare HMO | Attending: Internal Medicine | Admitting: Internal Medicine

## 2018-12-11 ENCOUNTER — Encounter (HOSPITAL_COMMUNITY): Payer: Self-pay | Admitting: Emergency Medicine

## 2018-12-11 ENCOUNTER — Emergency Department (HOSPITAL_COMMUNITY): Payer: Medicare HMO

## 2018-12-11 DIAGNOSIS — E785 Hyperlipidemia, unspecified: Secondary | ICD-10-CM | POA: Diagnosis present

## 2018-12-11 DIAGNOSIS — I361 Nonrheumatic tricuspid (valve) insufficiency: Secondary | ICD-10-CM | POA: Diagnosis not present

## 2018-12-11 DIAGNOSIS — J9601 Acute respiratory failure with hypoxia: Secondary | ICD-10-CM | POA: Diagnosis present

## 2018-12-11 DIAGNOSIS — I10 Essential (primary) hypertension: Secondary | ICD-10-CM | POA: Diagnosis not present

## 2018-12-11 DIAGNOSIS — J810 Acute pulmonary edema: Secondary | ICD-10-CM | POA: Diagnosis present

## 2018-12-11 DIAGNOSIS — Z515 Encounter for palliative care: Secondary | ICD-10-CM | POA: Diagnosis not present

## 2018-12-11 DIAGNOSIS — I2699 Other pulmonary embolism without acute cor pulmonale: Secondary | ICD-10-CM | POA: Diagnosis not present

## 2018-12-11 DIAGNOSIS — Z66 Do not resuscitate: Secondary | ICD-10-CM | POA: Diagnosis not present

## 2018-12-11 DIAGNOSIS — I110 Hypertensive heart disease with heart failure: Secondary | ICD-10-CM | POA: Diagnosis not present

## 2018-12-11 DIAGNOSIS — J189 Pneumonia, unspecified organism: Secondary | ICD-10-CM | POA: Diagnosis present

## 2018-12-11 DIAGNOSIS — R627 Adult failure to thrive: Secondary | ICD-10-CM | POA: Diagnosis present

## 2018-12-11 DIAGNOSIS — J690 Pneumonitis due to inhalation of food and vomit: Principal | ICD-10-CM | POA: Diagnosis present

## 2018-12-11 DIAGNOSIS — Z885 Allergy status to narcotic agent status: Secondary | ICD-10-CM

## 2018-12-11 DIAGNOSIS — I5033 Acute on chronic diastolic (congestive) heart failure: Secondary | ICD-10-CM | POA: Diagnosis not present

## 2018-12-11 DIAGNOSIS — Z7189 Other specified counseling: Secondary | ICD-10-CM | POA: Diagnosis not present

## 2018-12-11 DIAGNOSIS — R0602 Shortness of breath: Secondary | ICD-10-CM | POA: Diagnosis not present

## 2018-12-11 DIAGNOSIS — R918 Other nonspecific abnormal finding of lung field: Secondary | ICD-10-CM | POA: Diagnosis not present

## 2018-12-11 DIAGNOSIS — D539 Nutritional anemia, unspecified: Secondary | ICD-10-CM | POA: Diagnosis present

## 2018-12-11 DIAGNOSIS — Z993 Dependence on wheelchair: Secondary | ICD-10-CM

## 2018-12-11 DIAGNOSIS — I252 Old myocardial infarction: Secondary | ICD-10-CM

## 2018-12-11 DIAGNOSIS — K219 Gastro-esophageal reflux disease without esophagitis: Secondary | ICD-10-CM | POA: Diagnosis present

## 2018-12-11 DIAGNOSIS — I82412 Acute embolism and thrombosis of left femoral vein: Secondary | ICD-10-CM | POA: Diagnosis not present

## 2018-12-11 DIAGNOSIS — R54 Age-related physical debility: Secondary | ICD-10-CM | POA: Diagnosis present

## 2018-12-11 DIAGNOSIS — Z7982 Long term (current) use of aspirin: Secondary | ICD-10-CM | POA: Diagnosis not present

## 2018-12-11 DIAGNOSIS — R000 Tachycardia, unspecified: Secondary | ICD-10-CM | POA: Diagnosis not present

## 2018-12-11 DIAGNOSIS — I2510 Atherosclerotic heart disease of native coronary artery without angina pectoris: Secondary | ICD-10-CM | POA: Diagnosis present

## 2018-12-11 DIAGNOSIS — Z79899 Other long term (current) drug therapy: Secondary | ICD-10-CM

## 2018-12-11 DIAGNOSIS — R0689 Other abnormalities of breathing: Secondary | ICD-10-CM | POA: Diagnosis not present

## 2018-12-11 DIAGNOSIS — K869 Disease of pancreas, unspecified: Secondary | ICD-10-CM | POA: Diagnosis present

## 2018-12-11 DIAGNOSIS — R634 Abnormal weight loss: Secondary | ICD-10-CM | POA: Diagnosis present

## 2018-12-11 DIAGNOSIS — R451 Restlessness and agitation: Secondary | ICD-10-CM | POA: Diagnosis present

## 2018-12-11 DIAGNOSIS — F0390 Unspecified dementia without behavioral disturbance: Secondary | ICD-10-CM | POA: Diagnosis present

## 2018-12-11 DIAGNOSIS — J80 Acute respiratory distress syndrome: Secondary | ICD-10-CM | POA: Diagnosis not present

## 2018-12-11 DIAGNOSIS — Z96651 Presence of right artificial knee joint: Secondary | ICD-10-CM | POA: Diagnosis present

## 2018-12-11 DIAGNOSIS — M7989 Other specified soft tissue disorders: Secondary | ICD-10-CM | POA: Diagnosis not present

## 2018-12-11 DIAGNOSIS — R0902 Hypoxemia: Secondary | ICD-10-CM | POA: Diagnosis not present

## 2018-12-11 DIAGNOSIS — Z7401 Bed confinement status: Secondary | ICD-10-CM | POA: Diagnosis not present

## 2018-12-11 LAB — CBC WITH DIFFERENTIAL/PLATELET
Abs Immature Granulocytes: 0.05 10*3/uL (ref 0.00–0.07)
Basophils Absolute: 0.1 10*3/uL (ref 0.0–0.1)
Basophils Relative: 1 %
EOS ABS: 0.1 10*3/uL (ref 0.0–0.5)
EOS PCT: 1 %
HEMATOCRIT: 37.7 % (ref 36.0–46.0)
HEMOGLOBIN: 11.8 g/dL — AB (ref 12.0–15.0)
IMMATURE GRANULOCYTES: 1 %
LYMPHS PCT: 15 %
Lymphs Abs: 1.5 10*3/uL (ref 0.7–4.0)
MCH: 31.7 pg (ref 26.0–34.0)
MCHC: 31.3 g/dL (ref 30.0–36.0)
MCV: 101.3 fL — AB (ref 80.0–100.0)
MONOS PCT: 8 %
Monocytes Absolute: 0.9 10*3/uL (ref 0.1–1.0)
NEUTROS PCT: 74 %
Neutro Abs: 7.6 10*3/uL (ref 1.7–7.7)
Platelets: 302 10*3/uL (ref 150–400)
RBC: 3.72 MIL/uL — ABNORMAL LOW (ref 3.87–5.11)
RDW: 14.8 % (ref 11.5–15.5)
WBC: 10.2 10*3/uL (ref 4.0–10.5)
nRBC: 0 % (ref 0.0–0.2)

## 2018-12-11 LAB — BRAIN NATRIURETIC PEPTIDE: B Natriuretic Peptide: 67.2 pg/mL (ref 0.0–100.0)

## 2018-12-11 LAB — COMPREHENSIVE METABOLIC PANEL
ALBUMIN: 2.5 g/dL — AB (ref 3.5–5.0)
ALK PHOS: 153 U/L — AB (ref 38–126)
ALT: 8 U/L (ref 0–44)
AST: 28 U/L (ref 15–41)
Anion gap: 13 (ref 5–15)
BILIRUBIN TOTAL: 0.6 mg/dL (ref 0.3–1.2)
BUN: 16 mg/dL (ref 8–23)
CALCIUM: 8.9 mg/dL (ref 8.9–10.3)
CO2: 23 mmol/L (ref 22–32)
CREATININE: 0.92 mg/dL (ref 0.44–1.00)
Chloride: 101 mmol/L (ref 98–111)
GFR calc Af Amer: >60 mL/min (ref >60)
GFR, EST NON AFRICAN AMERICAN: 56 mL/min — AB (ref >60)
GLUCOSE: 99 mg/dL (ref 70–99)
POTASSIUM: 3.2 mmol/L — AB (ref 3.5–5.1)
Sodium: 137 mmol/L (ref 135–145)
Total Protein: 6.5 g/dL (ref 6.5–8.1)

## 2018-12-11 LAB — TROPONIN I: TROPONIN I: 0.06 ng/mL — AB (ref <0.03)

## 2018-12-11 MED ORDER — SODIUM CHLORIDE 0.9 % IV SOLN
1.0000 g | Freq: Once | INTRAVENOUS | Status: AC
  Administered 2018-12-12: 1 g via INTRAVENOUS
  Filled 2018-12-11: qty 1

## 2018-12-11 MED ORDER — VANCOMYCIN HCL IN DEXTROSE 1-5 GM/200ML-% IV SOLN
1000.0000 mg | Freq: Once | INTRAVENOUS | Status: AC
  Administered 2018-12-11: 1000 mg via INTRAVENOUS
  Filled 2018-12-11: qty 200

## 2018-12-11 NOTE — ED Triage Notes (Signed)
  Patient BIB EMS from SNF for SOB that has been going on for last 24 hrs.  Patient has hx of CHF and dementia.  Patient had been having increased fluid retention in bilateral lower extremities with more in the left leg.  Patient was hypoxic on EMS arrival and was given 125 mg solumedrol and a duoneb.  Patient is on 2L [place] and 93%.  Patient was 84% on RA.  Patient is A&O x4 and has no complaints of pain.

## 2018-12-11 NOTE — ED Provider Notes (Signed)
[facility] EMERGENCY DEPARTMENT Provider Note   CSN: [id number] Arrival date & time: 12/11/18  1937     History   Chief Complaint Chief Complaint  Patient presents with  . Shortness of Breath    HPI Rebecca Ford is a 85 y.o. female.  The history is provided by the patient. No language interpreter was used.  Shortness of Breath  This is a new problem. The average episode lasts 2 days. The problem occurs continuously.The current episode started 2 days ago. The problem has been gradually worsening. Associated symptoms include leg swelling. She has tried nothing for the symptoms. The treatment provided moderate relief. Associated medical issues include heart failure.  Pt's daughter reports pt has reflux.  She began coughing two days ago.  Family is concerned that pt may have aspirated.  Pt has had increasing shortness of breath.  [name] not on 02 usually.  Pt has had physical and mental decline since October.  [name] in a rehab facility but is no longer walking or doing therapy/   Past Medical History:  Diagnosis Date  . Arthritis   . CAD (coronary artery disease)    s/p cath 09/07/10 w/3 vessel CAD, DES in RCA, Diagonal & LAD  . Dementia ([place])   . HTN (hypertension)   . Hyperlipemia   . MI (myocardial infarction) ([place])    Non-ST elevation 9/11  . Primary osteoarthritis of right knee 09/2016    Patient Active Problem List   Diagnosis Date Noted  . Acute renal failure (ARF) ([place]) 10/25/2018  . Rash and nonspecific skin eruption 10/25/2018  . Confusion 10/25/2018  . CHF (congestive heart failure) ([place]) 10/02/2018  . Acute CHF (congestive heart failure) ([place]) 10/01/2018  . Macrocytic anemia 10/01/2018  . Chronic depression 12/03/2016  . Localized osteoarthritis of right knee 11/13/2016  . Primary osteoarthritis of right knee 1Oct 29, 202017  . Syncope 11/16/2015  . 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 HYPERTENSION, BENIGN 09/20/2010  . CAD, NATIVE VESSEL  09/20/2010    Past Surgical History:  Procedure Laterality Date  . CARDIAC CATHETERIZATION     YRS AGO   . CATARACT EXTRACTION W/ INTRAOCULAR LENS  IMPLANT, BILATERAL    . PARTIAL HYSTERECTOMY    . RETINAL DETACHMENT SURGERY     LEFT  . TOTAL KNEE ARTHROPLASTY Right 11/13/2016   Procedure: TOTAL KNEE ARTHROPLASTY;  Surgeon: Frederik Pear, MD;  Location: [facility];  Service: Orthopedics;  Laterality: Right;     OB History   No obstetric history on file.      Home Medications    Prior to Admission medications   Medication Sig Start Date End Date Taking? Authorizing Provider  acetaminophen (TYLENOL) 325 MG tablet Take 650 mg by mouth every 6 (six) hours as needed for mild pain.   Yes [provider]  aspirin EC 81 MG tablet Take 1 tablet (81 mg total) by mouth daily. 05/03/17  Yes Burnell Blanks, MD  cetirizine (ZYRTEC) 10 MG tablet Take 10 mg by mouth daily.   Yes [provider]  diltiazem (CARDIZEM) 30 MG tablet Take 1 tablet (30 mg total) by mouth every 12 (twelve) hours. Patient taking differently: Take 30 mg by mouth 2 (two) times daily.  10/09/18 12/11/18 Yes Arrien, Jimmy Picket, MD  Ferrous Gluconate (IRON 27 PO) Take 1 tablet by mouth daily.   Yes [provider]  furosemide (LASIX) 20 MG tablet Take 20 mg by mouth every morning.   Yes  [provider]  LORazepam (ATIVAN) 0.5 MG tablet Take 0.5 mg by mouth 3 (three) times daily.   Yes [provider]  metoprolol (LOPRESSOR) 50 MG tablet Take 50 mg by mouth 2 (two) times daily.  03/28/14  Yes [provider]  Multiple Vitamins-Minerals (PRESERVISION AREDS PO) Take 1 tablet by mouth 2 (two) times daily.   Yes [provider]  nitroGLYCERIN (NITROSTAT) 0.4 MG SL tablet Place 1 tablet (0.4 mg total) under the tongue every 5 (five) minutes as needed for chest pain. 06/04/14  Yes Burnell Blanks, MD  pantoprazole (PROTONIX) 40 MG tablet Take 1 tablet (40 mg  total) by mouth 2 (two) times daily. 10/09/18 12/11/18 Yes Arrien, Jimmy Picket, MD  polyethylene glycol [facility]) packet Take 17 g by mouth daily.   Yes [provider]  traZODone (DESYREL) 50 MG tablet Take 50 mg by mouth at bedtime as needed for sleep.   Yes [provider]    Family History Family History  Problem Relation Age of Onset  . Heart attack Mother   . Coronary artery disease Other        diagnosis of CAD but not premature diagnosis  . Coronary artery disease Brother     Social History Social History   Tobacco Use  . Smoking status: Never Smoker  . Smokeless tobacco: Never Used  Substance Use Topics  . Alcohol use: No  . Drug use: No     Allergies   Codeine   Review of Systems Review of Systems  Respiratory: Positive for shortness of breath.   Cardiovascular: Positive for leg swelling.  All other systems reviewed and are negative.    Physical Exam Updated Vital Signs BP (!) 146/77   Pulse (!) 112   Temp 97.9 F (36.6 C) (Oral)   Resp (!) 28   Ht 5\' 2"  (1.575 m)   Wt 58.3 kg   SpO2 91%   BMI 23.52 kg/m   Physical Exam Vitals signs and nursing note reviewed.  Constitutional:      Appearance: She is well-developed.  HENT:     Head: Normocephalic.  Neck:     Musculoskeletal: Normal range of motion.  Cardiovascular:     Rate and Rhythm: Normal rate.  Pulmonary:     Effort: Pulmonary effort is normal.  Abdominal:     General: There is no distension.  Musculoskeletal: Normal range of motion.  Skin:    General: Skin is warm.  Neurological:     General: No focal deficit present.     Mental Status: She is alert and oriented to person, place, and time.  Psychiatric:        Mood and Affect: Mood normal.      ED Treatments / Results  Labs (all labs ordered are listed, but only abnormal results are displayed) Labs Reviewed  CBC WITH DIFFERENTIAL/PLATELET - Abnormal; Notable for the following components:       Result Value   RBC 3.72 (*)    Hemoglobin 11.8 (*)    MCV 101.3 (*)    All other components within normal limits  COMPREHENSIVE METABOLIC PANEL - Abnormal; Notable for the following components:   Potassium 3.2 (*)    Albumin 2.5 (*)    Alkaline Phosphatase 153 (*)    GFR calc non Af Amer 56 (*)    All other components within normal limits  TROPONIN I - Abnormal; Notable for the following components:   Troponin I 0.06 (*)  All other components within normal limits  BRAIN NATRIURETIC PEPTIDE  URINALYSIS, ROUTINE W REFLEX MICROSCOPIC    EKG EKG Interpretation  Date/Time:  Wednesday December 11 2018 21:06:00 EST Ventricular Rate:  110 PR Interval:    QRS Duration: 88 QT Interval:  445 QTC Calculation: 603 R Axis:   -49 Text Interpretation:  Sinus tachycardia Prolonged QT interval Non-specific ST-t changes Confirmed by Lajean Saver [phone]) on 12/11/2018 10:07:05 PM   Radiology Dg Chest 2 View  Result Date: 12/11/2018 CLINICAL DATA:  Shortness of breath starting this morning EXAM: CHEST - 2 VIEW COMPARISON:  October 30, 2018 FINDINGS: The heart size and mediastinal contours are within normal limits. There is opacity of the right mid lung and right lung base. There is right pleural effusion. The left lung is clear. The visualized skeletal structures are unremarkable. IMPRESSION: Opacity of the right mid lung and right lung base probably due to a combination of right lung base consolidation with pleural effusion. Underlying pneumonia is not excluded. Electronically Signed   By: Abelardo Diesel M.D.   On: 12/11/2018 21:04    Procedures Procedures (including critical care time)  Medications Ordered in ED Medications - No data to display   Initial Impression / Assessment and Plan / ED Course  I have reviewed the triage vital signs and the nursing notes.  Pertinent labs & imaging results that were available during my care of the patient were reviewed by me and considered in my  medical decision making (see chart for details).     MDM  Pt has a right pleural effusion which is new.  Radiology reports pneumonia can not be ruled out.   Pt has a elevation of troponin to 0.06.  Pt has a history of Chf.   I will discuss with Hospitalist for admission for possible aspiration pneumonia with new 02 requirement and repeat troponins  Final Clinical Impressions(s) / ED Diagnoses   Final diagnoses:  Aspiration pneumonia of right lung, unspecified aspiration pneumonia type, unspecified part of lung [facility])    ED Discharge Orders    None     I spoke with Dr. Delford Field who will see for admission    Sidney Ace 12/11/18 2246    Lajean Saver, MD 12/12/18 [id number]

## 2018-12-12 ENCOUNTER — Encounter (HOSPITAL_COMMUNITY): Payer: Self-pay | Admitting: Internal Medicine

## 2018-12-12 ENCOUNTER — Other Ambulatory Visit: Payer: Self-pay

## 2018-12-12 ENCOUNTER — Encounter: Payer: Self-pay | Admitting: Nurse Practitioner

## 2018-12-12 ENCOUNTER — Inpatient Hospital Stay (HOSPITAL_COMMUNITY): Payer: Medicare HMO

## 2018-12-12 DIAGNOSIS — Z7189 Other specified counseling: Secondary | ICD-10-CM

## 2018-12-12 DIAGNOSIS — M7989 Other specified soft tissue disorders: Secondary | ICD-10-CM | POA: Diagnosis not present

## 2018-12-12 DIAGNOSIS — J9601 Acute respiratory failure with hypoxia: Secondary | ICD-10-CM

## 2018-12-12 DIAGNOSIS — E785 Hyperlipidemia, unspecified: Secondary | ICD-10-CM | POA: Diagnosis present

## 2018-12-12 DIAGNOSIS — I252 Old myocardial infarction: Secondary | ICD-10-CM | POA: Diagnosis not present

## 2018-12-12 DIAGNOSIS — D539 Nutritional anemia, unspecified: Secondary | ICD-10-CM | POA: Diagnosis present

## 2018-12-12 DIAGNOSIS — K219 Gastro-esophageal reflux disease without esophagitis: Secondary | ICD-10-CM | POA: Diagnosis present

## 2018-12-12 DIAGNOSIS — Z7401 Bed confinement status: Secondary | ICD-10-CM | POA: Diagnosis not present

## 2018-12-12 DIAGNOSIS — Z515 Encounter for palliative care: Secondary | ICD-10-CM

## 2018-12-12 DIAGNOSIS — J810 Acute pulmonary edema: Secondary | ICD-10-CM | POA: Diagnosis present

## 2018-12-12 DIAGNOSIS — I361 Nonrheumatic tricuspid (valve) insufficiency: Secondary | ICD-10-CM

## 2018-12-12 DIAGNOSIS — R627 Adult failure to thrive: Secondary | ICD-10-CM | POA: Diagnosis present

## 2018-12-12 DIAGNOSIS — I2699 Other pulmonary embolism without acute cor pulmonale: Secondary | ICD-10-CM | POA: Diagnosis present

## 2018-12-12 DIAGNOSIS — Z7982 Long term (current) use of aspirin: Secondary | ICD-10-CM | POA: Diagnosis not present

## 2018-12-12 DIAGNOSIS — R54 Age-related physical debility: Secondary | ICD-10-CM | POA: Diagnosis present

## 2018-12-12 DIAGNOSIS — R451 Restlessness and agitation: Secondary | ICD-10-CM | POA: Diagnosis present

## 2018-12-12 DIAGNOSIS — K869 Disease of pancreas, unspecified: Secondary | ICD-10-CM | POA: Diagnosis present

## 2018-12-12 DIAGNOSIS — F0390 Unspecified dementia without behavioral disturbance: Secondary | ICD-10-CM | POA: Diagnosis present

## 2018-12-12 DIAGNOSIS — I5033 Acute on chronic diastolic (congestive) heart failure: Secondary | ICD-10-CM | POA: Diagnosis present

## 2018-12-12 DIAGNOSIS — Z66 Do not resuscitate: Secondary | ICD-10-CM | POA: Diagnosis not present

## 2018-12-12 DIAGNOSIS — I110 Hypertensive heart disease with heart failure: Secondary | ICD-10-CM | POA: Diagnosis present

## 2018-12-12 DIAGNOSIS — Z993 Dependence on wheelchair: Secondary | ICD-10-CM | POA: Diagnosis not present

## 2018-12-12 DIAGNOSIS — I10 Essential (primary) hypertension: Secondary | ICD-10-CM | POA: Diagnosis not present

## 2018-12-12 DIAGNOSIS — J690 Pneumonitis due to inhalation of food and vomit: Secondary | ICD-10-CM | POA: Diagnosis present

## 2018-12-12 DIAGNOSIS — I2510 Atherosclerotic heart disease of native coronary artery without angina pectoris: Secondary | ICD-10-CM | POA: Diagnosis present

## 2018-12-12 DIAGNOSIS — Z79899 Other long term (current) drug therapy: Secondary | ICD-10-CM | POA: Diagnosis not present

## 2018-12-12 DIAGNOSIS — Z96651 Presence of right artificial knee joint: Secondary | ICD-10-CM | POA: Diagnosis present

## 2018-12-12 DIAGNOSIS — R634 Abnormal weight loss: Secondary | ICD-10-CM | POA: Diagnosis present

## 2018-12-12 DIAGNOSIS — J189 Pneumonia, unspecified organism: Secondary | ICD-10-CM | POA: Diagnosis present

## 2018-12-12 DIAGNOSIS — I82412 Acute embolism and thrombosis of left femoral vein: Secondary | ICD-10-CM | POA: Diagnosis present

## 2018-12-12 LAB — ECHOCARDIOGRAM COMPLETE
Height: 62 in
Weight: 2042.34 oz

## 2018-12-12 LAB — BASIC METABOLIC PANEL
Anion gap: 10 (ref 5–15)
BUN: 18 mg/dL (ref 8–23)
CO2: 22 mmol/L (ref 22–32)
Calcium: 8.6 mg/dL — ABNORMAL LOW (ref 8.9–10.3)
Chloride: 103 mmol/L (ref 98–111)
Creatinine, Ser: 0.98 mg/dL (ref 0.44–1.00)
GFR calc Af Amer: >60 mL/min (ref >60)
GFR calc non Af Amer: 52 mL/min — ABNORMAL LOW (ref >60)
Glucose, Bld: 262 mg/dL — ABNORMAL HIGH (ref 70–99)
Potassium: 3.5 mmol/L (ref 3.5–5.1)
Sodium: 135 mmol/L (ref 135–145)

## 2018-12-12 LAB — CBC
HCT: 35.8 % — ABNORMAL LOW (ref 36.0–46.0)
HEMOGLOBIN: 11.3 g/dL — AB (ref 12.0–15.0)
MCH: 31.7 pg (ref 26.0–34.0)
MCHC: 31.6 g/dL (ref 30.0–36.0)
MCV: 100.6 fL — ABNORMAL HIGH (ref 80.0–100.0)
Platelets: 259 10*3/uL (ref 150–400)
RBC: 3.56 MIL/uL — ABNORMAL LOW (ref 3.87–5.11)
RDW: 14.6 % (ref 11.5–15.5)
WBC: 7.7 10*3/uL (ref 4.0–10.5)
nRBC: 0 % (ref 0.0–0.2)

## 2018-12-12 LAB — URINALYSIS, ROUTINE W REFLEX MICROSCOPIC
Bacteria, UA: NONE SEEN
Bilirubin Urine: NEGATIVE
GLUCOSE, UA: NEGATIVE mg/dL
Hgb urine dipstick: NEGATIVE
KETONES UR: 5 mg/dL — AB
Nitrite: NEGATIVE
PH: 5 (ref 5.0–8.0)
Protein, ur: NEGATIVE mg/dL
SPECIFIC GRAVITY, URINE: 1.016 (ref 1.005–1.030)

## 2018-12-12 LAB — D-DIMER, QUANTITATIVE: D-Dimer, Quant: 11.24 ug/mL-FEU — ABNORMAL HIGH (ref 0.00–0.50)

## 2018-12-12 LAB — HEPARIN LEVEL (UNFRACTIONATED): Heparin Unfractionated: 1.04 IU/mL — ABNORMAL HIGH (ref 0.30–0.70)

## 2018-12-12 MED ORDER — NITROGLYCERIN 0.4 MG SL SUBL: 0.4000 mg | SUBLINGUAL_TABLET | SUBLINGUAL | Status: DC | PRN

## 2018-12-12 MED ORDER — HEPARIN (PORCINE) 25000 UT/250ML-% IV SOLN
800.0000 [IU]/h | INTRAVENOUS | Status: AC
  Administered 2018-12-13: 800 [IU]/h via INTRAVENOUS
  Filled 2018-12-12: qty 250

## 2018-12-12 MED ORDER — ENOXAPARIN SODIUM 40 MG/0.4ML ~~LOC~~ SOLN: 40.0000 mg | SUBCUTANEOUS | Status: DC

## 2018-12-12 MED ORDER — SODIUM CHLORIDE 0.9 % IV SOLN: 1.0000 g | INTRAVENOUS | Status: DC

## 2018-12-12 MED ORDER — TRAZODONE HCL 50 MG PO TABS
50.0000 mg | ORAL_TABLET | Freq: Every evening | ORAL | Status: DC | PRN
  Administered 2018-12-12 – 2018-12-15 (×4): 50 mg via ORAL
  Filled 2018-12-12 (×4): qty 1

## 2018-12-12 MED ORDER — IOPAMIDOL (ISOVUE-370) INJECTION 76%
75.0000 mL | Freq: Once | INTRAVENOUS | Status: AC | PRN
  Administered 2018-12-12: 75 mL via INTRAVENOUS

## 2018-12-12 MED ORDER — LORAZEPAM 1 MG PO TABS
0.5000 mg | ORAL_TABLET | Freq: Three times a day (TID) | ORAL | Status: DC
  Administered 2018-12-12 – 2018-12-16 (×13): 0.5 mg via ORAL
  Filled 2018-12-12 (×13): qty 1

## 2018-12-12 MED ORDER — FUROSEMIDE 20 MG PO TABS
20.0000 mg | ORAL_TABLET | ORAL | Status: DC
  Administered 2018-12-13 – 2018-12-16 (×4): 20 mg via ORAL
  Filled 2018-12-12 (×4): qty 1

## 2018-12-12 MED ORDER — PANTOPRAZOLE SODIUM 40 MG PO TBEC
40.0000 mg | DELAYED_RELEASE_TABLET | Freq: Two times a day (BID) | ORAL | Status: DC
  Administered 2018-12-12 – 2018-12-16 (×9): 40 mg via ORAL
  Filled 2018-12-12 (×9): qty 1

## 2018-12-12 MED ORDER — SODIUM CHLORIDE 0.9 % IV SOLN
3.0000 g | Freq: Three times a day (TID) | INTRAVENOUS | Status: DC
  Administered 2018-12-12 – 2018-12-14 (×6): 3 g via INTRAVENOUS
  Filled 2018-12-12 (×7): qty 3

## 2018-12-12 MED ORDER — ONDANSETRON HCL 4 MG/2ML IJ SOLN: 4.0000 mg | Freq: Four times a day (QID) | INTRAMUSCULAR | Status: DC | PRN

## 2018-12-12 MED ORDER — ACETAMINOPHEN 650 MG RE SUPP: 650.0000 mg | Freq: Four times a day (QID) | RECTAL | Status: DC | PRN

## 2018-12-12 MED ORDER — DILTIAZEM HCL 30 MG PO TABS
30.0000 mg | ORAL_TABLET | Freq: Two times a day (BID) | ORAL | Status: DC
  Administered 2018-12-12 – 2018-12-16 (×9): 30 mg via ORAL
  Filled 2018-12-12 (×10): qty 1

## 2018-12-12 MED ORDER — FERROUS GLUCONATE 324 (38 FE) MG PO TABS
324.0000 mg | ORAL_TABLET | Freq: Every day | ORAL | Status: DC
  Administered 2018-12-12 – 2018-12-16 (×5): 324 mg via ORAL
  Filled 2018-12-12 (×5): qty 1

## 2018-12-12 MED ORDER — FUROSEMIDE 10 MG/ML IJ SOLN
20.0000 mg | Freq: Two times a day (BID) | INTRAMUSCULAR | Status: DC
  Administered 2018-12-12: 20 mg via INTRAVENOUS
  Filled 2018-12-12: qty 2

## 2018-12-12 MED ORDER — IOPAMIDOL (ISOVUE-370) INJECTION 76%
INTRAVENOUS | Status: AC
  Filled 2018-12-12: qty 100

## 2018-12-12 MED ORDER — METOPROLOL TARTRATE 25 MG PO TABS
50.0000 mg | ORAL_TABLET | Freq: Two times a day (BID) | ORAL | Status: DC
  Administered 2018-12-12 – 2018-12-16 (×9): 50 mg via ORAL
  Filled 2018-12-12 (×9): qty 2

## 2018-12-12 MED ORDER — LORATADINE 10 MG PO TABS
10.0000 mg | ORAL_TABLET | Freq: Every day | ORAL | Status: DC
  Administered 2018-12-12 – 2018-12-16 (×5): 10 mg via ORAL
  Filled 2018-12-12 (×5): qty 1

## 2018-12-12 MED ORDER — ONDANSETRON HCL 4 MG PO TABS: 4.0000 mg | ORAL_TABLET | Freq: Four times a day (QID) | ORAL | Status: DC | PRN

## 2018-12-12 MED ORDER — IOPAMIDOL (ISOVUE-370) INJECTION 76%: 100.0000 mL | Freq: Once | INTRAVENOUS | Status: DC

## 2018-12-12 MED ORDER — ACETAMINOPHEN 325 MG PO TABS: 650.0000 mg | ORAL_TABLET | Freq: Four times a day (QID) | ORAL | Status: DC | PRN

## 2018-12-12 MED ORDER — VANCOMYCIN HCL IN DEXTROSE 750-5 MG/150ML-% IV SOLN: 750.0000 mg | INTRAVENOUS | Status: DC

## 2018-12-12 MED ORDER — HEPARIN (PORCINE) 25000 UT/250ML-% IV SOLN
1000.0000 [IU]/h | INTRAVENOUS | Status: DC
  Administered 2018-12-12: 1000 [IU]/h via INTRAVENOUS
  Filled 2018-12-12: qty 250

## 2018-12-12 MED ORDER — GUAIFENESIN ER 600 MG PO TB12
600.0000 mg | ORAL_TABLET | Freq: Two times a day (BID) | ORAL | Status: DC
  Administered 2018-12-12 – 2018-12-16 (×9): 600 mg via ORAL
  Filled 2018-12-12 (×9): qty 1

## 2018-12-12 MED ORDER — POLYETHYLENE GLYCOL 3350 17 G PO PACK
17.0000 g | PACK | Freq: Every day | ORAL | Status: DC
  Administered 2018-12-14 – 2018-12-16 (×3): 17 g via ORAL
  Filled 2018-12-12 (×4): qty 1

## 2018-12-12 MED ORDER — ASPIRIN EC 81 MG PO TBEC
81.0000 mg | DELAYED_RELEASE_TABLET | Freq: Every day | ORAL | Status: DC
  Administered 2018-12-12 – 2018-12-13 (×2): 81 mg via ORAL
  Filled 2018-12-12 (×2): qty 1

## 2018-12-12 MED ORDER — HEPARIN BOLUS VIA INFUSION
3500.0000 [IU] | Freq: Once | INTRAVENOUS | Status: AC
  Administered 2018-12-12: 3500 [IU] via INTRAVENOUS
  Filled 2018-12-12: qty 3500

## 2018-12-12 NOTE — Progress Notes (Signed)
Left lower extremity venous duplex has been completed. There is evidence of acute deep vein thrombosis involving the common femoral, and femoral veins of the left lower extremity.  Results were given to the patient's nurse, Ulice Dash.  12/12/18 9:02 AM Rebecca Ford RVT

## 2018-12-12 NOTE — Progress Notes (Signed)
Pharmacy Antibiotic Note  Rebecca Ford is a 85 y.o. female admitted on 12/11/2018 with aspiration PNA.  Pharmacy has been consulted for Unasyn dosing.  Plan: Ampicillin-sulbactam 3gm IV q8h Monitor for clinical course, LOT, and deescalation  Height: 5\' 2"  (157.5 cm) Weight: 127 lb 10.3 oz (57.9 kg) IBW/kg (Calculated) : 50.1  Temp (24hrs), Avg:97.8 F (36.6 C), Min:97.4 F (36.3 C), Max:98.1 F (36.7 C)  Recent Labs  Lab 12/11/18 2022 12/12/18 0404  WBC 10.2 7.7  CREATININE 0.92 0.98    Estimated Creatinine Clearance: 32.6 mL/min (by C-G formula based on SCr of 0.98 mg/dL).    Allergies  Allergen Reactions  . Codeine Other (See Comments)    Makes her severely depressed     Azad Calame A. Levada Dy, PharmD, [place] Pager: [phone] Please utilize Amion for appropriate phone number to reach the unit pharmacist ([place])   12/12/2018 12:30 PM

## 2018-12-12 NOTE — Progress Notes (Signed)
ANTICOAGULATION CONSULT NOTE - Initial Consult  Pharmacy Consult for heparin Indication: PE/ DVT  Allergies  Allergen Reactions  . Codeine Other (See Comments)    Makes her severely depressed     Patient Measurements: Height: 5\' 2"  (157.5 cm) Weight: 127 lb 10.3 oz (57.9 kg) IBW/kg (Calculated) : 50.1 Heparin Dosing Weight: 57.9kg  Vital Signs: Temp: 98.1 F (36.7 C) (01/02 0910) Temp Source: Oral (01/02 0910) BP: 135/55 (01/02 0910) Pulse Rate: 79 (01/02 0910)  Labs: Recent Labs    12/11/18 2022 12/12/18 0404 12/12/18 1624  HGB 11.8* 11.3*  --   HCT 37.7 35.8*  --   PLT 302 259  --   HEPARINUNFRC  --   --  1.04*  CREATININE 0.92 0.98  --   TROPONINI 0.06*  --   --     Estimated Creatinine Clearance: 32.6 mL/min (by C-G formula based on SCr of 0.98 mg/dL).   Medical History: Past Medical History:  Diagnosis Date  . Arthritis   . CAD (coronary artery disease)    s/p cath 09/07/10 w/3 vessel CAD, DES in RCA, Diagonal & LAD  . Dementia ([place])   . HTN (hypertension)   . Hyperlipemia   . MI (myocardial infarction) ([place])    Non-ST elevation 9/11  . Primary osteoarthritis of right knee 09/2016    Assessment: 58 YOF with bilateral PE on no AC PTA.  H/H low but stable, plts 259.  Heparin level 1.04, supratherapeutic, drawn appropriately.    Goal of Therapy:  Heparin level 0.3-0.7 units/ml Monitor platelets by anticoagulation protocol: Yes   Plan:  Hold heparin for 1 hr Restart heparin at 1800 and decrease rate to 800 units/hr F/u 8 hour heparin level at 0200 Daily heparin level, CBC, s/s bleeding  Maryanna Shape, PharmD, BCPS, BCPPS Clinical Pharmacist  Pager: [phone]   12/12/2018 5:00 PM

## 2018-12-12 NOTE — Consult Note (Signed)
Consultation Note Date: 12/12/2018   Patient Name: Rebecca Ford  DOB: [birth year]  MRN: [id number]  Age / Sex: 85 y.o., female  PCP: [name], MD Referring Physician: Jonetta Osgood, MD  Reason for Consultation: Establishing goals of care  HPI/Patient Profile: 85 y.o. female  with past medical history of CAD, HTN. HLD, CHF, failure to thrive, anemia, and dementia admitted on 12/11/2018 with increasing shortness of breath after possible aspiration episode. Apparently patient was having lunch and started gagging - since episode, she has become increasingly short of breath. Spending most of her time in bed.  CXR revealed consolidation in the right lung with some pleural effusion. D-dimer 11.24. Left lower extremity venous duplex revealed thrombosis involving the common femoral, and femoral veins of the left lower extremity. CT angio chest revealed acute bilateral pulmonary thromboembolic disease. Also, increasing nodularity in the right pericardiac fat, suggesting the possibility of a malignant right pleural effusion and an indeterminate mass or complex fluid collection in the left upper quadrant of the abdomen. Questionable pancreatic malignancy. Patient seen by outpatient palliative care at [facility]. PMT consulted inpatient for [place].   Clinical Assessment and Goals of Care: I have reviewed medical records including EPIC notes, labs and imaging, received report from Dr. [name] and RN, assessed the patient and then met with the patient  to discuss diagnosis prognosis, [place], EOL wishes, disposition and options.  Briefly discussed care with patient - diagnosis of dementia noted. Daughter not at bedside.  Throughout conversation, patient has periods of confusion - cannot remember the name of facility where she lives or names of medications - but has understanding of diagnoses. Tells me many times she does not want to talk about cancer - right now she wants to  focus on the blood clots. I attempted to discuss looking at the "big picture"; however, she declines this conversation. Will plan to have discussion with daughter.   I introduced Palliative Medicine as specialized medical care for people living with serious illness. It focuses on providing relief from the symptoms and stress of a serious illness. The goal is to improve quality of life for both the patient and the family.  We discussed a brief life review of the patient. Patient tells me she is the youngest of 32 children. She has one daughter and 2 grandchildren - she shares with me about them. She worked at [employer] for 41 years. She tells me "I've lived a good life". Shares about living in a facility now.   As far as functional and nutritional status, she tells me she spends most of her time in a wheelchair. Tells me she gets out of bed daily. Speaks of poor appetite.    We discussed her current illness and what it means in the larger context of her on-going co-morbidities.  Natural disease trajectory and expectations at EOL were discussed. I asked her to share with me what she knew about her medical diagnoses and she speaks about blood clots and tells me there is mention of cancer. She is upset by this.   I attempted to elicit values and goals of care important to the patient.  She tells me she does not want to die but is "ready when it is her time". Shares with me about her spirituality.   Held off on further goals of care discussion until patient's daughter can be present.  Called daughter via phone and spoke briefly. She would like to meet tomorrow to further discuss care in person. She  shares with me that she does not feel patient can participate in [place] conversations because her dementia is "severe".  For now, continue current care with heparin drip. Tomorrow will continue [place] discussion and discharge planning. Will share MOST form.   Questions and concerns were addressed. The family was  encouraged to call with questions or concerns.   Primary Decision Maker NEXT OF KIN - daughter - Abigail Butts  SUMMARY OF RECOMMENDATIONS   - initial conversation with patient, spoke with daughter via phone who would like to discuss patient's care in person  - continue current care with heparin drip - to further discuss goals of care with patient's daughter, Abigail Butts, tomorrow 1/3 at 11 am   Code Status/Advance Care Planning:  DNR  Symptom Management:  - denied by patient  Palliative Prophylaxis:   Aspiration, Delirium Protocol and Frequent Pain Assessment  Prognosis:   Unable to determine  Discharge Planning: To Be Determined      Primary Diagnoses: Present on Admission: . Acute respiratory failure with hypoxia ([place]) . Macrocytic anemia . HYPERTENSION, BENIGN . CAP (community acquired pneumonia) . Acute on chronic diastolic CHF (congestive heart failure) ([place]) . Acute pulmonary edema ([facility])   I have reviewed the medical record, interviewed the patient and family, and examined the patient. The following aspects are pertinent.  Past Medical History:  Diagnosis Date  . Arthritis   . CAD (coronary artery disease)    s/p cath 09/07/10 w/3 vessel CAD, DES in RCA, Diagonal & LAD  . Dementia ([place])   . HTN (hypertension)   . Hyperlipemia   . MI (myocardial infarction) ([place])    Non-ST elevation 9/11  . Primary osteoarthritis of right knee 09/2016   Social History   Socioeconomic History  . Marital status: Single    Spouse name: Not on file  . Number of children: Not on file  . Years of education: Not on file  . Highest education level: Not on file  Occupational History  . Not on file  Social Needs  . Financial resource strain: Not on file  . Food insecurity:    Worry: Not on file    Inability: Not on file  . Transportation needs:    Medical: Not on file    Non-medical: Not on file  Tobacco Use  . Smoking status: Never Smoker  . Smokeless tobacco: Never Used    Substance and Sexual Activity  . Alcohol use: No  . Drug use: No  . Sexual activity: Not on file  Lifestyle  . Physical activity:    Days per week: Not on file    Minutes per session: Not on file  . Stress: Not on file  Relationships  . Social connections:    Talks on phone: Not on file    Gets together: Not on file    Attends religious service: Not on file    Active member of club or organization: Not on file    Attends meetings of clubs or organizations: Not on file    Relationship status: Not on file  Other Topics Concern  . Not on file  Social History Narrative  . Not on file   Family History  Problem Relation Age of Onset  . Heart attack Mother   . Coronary artery disease Other        diagnosis of CAD but not premature diagnosis  . Coronary artery disease Brother    Scheduled Meds: . aspirin EC  81 mg Oral Daily  . diltiazem  30 mg Oral BID  . ferrous gluconate  324 mg Oral Q breakfast  . furosemide  20 mg Intravenous Q12H  . iopamidol  100 mL Intravenous Once  . iopamidol      . loratadine  10 mg Oral Daily  . LORazepam  0.5 mg Oral TID  . metoprolol tartrate  50 mg Oral BID  . pantoprazole  40 mg Oral BID  . polyethylene glycol  17 g Oral Daily   Continuous Infusions: . ceFEPime (MAXIPIME) IV    . heparin 1,000 Units/hr (12/12/18 4585)  . vancomycin     PRN Meds:.acetaminophen **OR** acetaminophen, nitroGLYCERIN, ondansetron **OR** ondansetron (ZOFRAN) IV, traZODone Allergies  Allergen Reactions  . Codeine Other (See Comments)    Makes her severely depressed    Review of Systems  Constitutional: Positive for activity change and appetite change.  Respiratory: Positive for shortness of breath.   Cardiovascular: Positive for leg swelling.    Physical Exam Constitutional:      General: She is not in acute distress. HENT:     Head: Normocephalic and atraumatic.  Cardiovascular:     Rate and Rhythm: Normal rate and regular rhythm.  Pulmonary:      Effort: Pulmonary effort is normal.     Breath sounds: Normal breath sounds.  Abdominal:     Palpations: Abdomen is soft.     Tenderness: There is no abdominal tenderness.  Musculoskeletal:     Left lower leg: Edema present.  Skin:    General: Skin is warm and dry.  Neurological:     Mental Status: She is alert and oriented to person, place, and time.  Psychiatric:        Mood and Affect: Mood normal.        Behavior: Behavior normal.     Vital Signs: BP (!) 135/55 (BP Location: Right Arm)   Pulse 79   Temp 98.1 F (36.7 C) (Oral)   Resp (!) 21   Ht '5\' 2"'  (1.575 m)   Wt 57.9 kg   SpO2 95%   BMI 23.35 kg/m  Pain Scale: 0-10   Pain Score: 0-No pain   SpO2: SpO2: 95 % O2 Device:SpO2: 95 % O2 Flow Rate: .O2 Flow Rate (L/min): 3 L/min  IO: Intake/output summary:   Intake/Output Summary (Last 24 hours) at 12/12/2018 1052 Last data filed at 12/12/2018 1020 Gross per 24 hour  Intake 320 ml  Output 300 ml  Net 20 ml    LBM: Last BM Date: (unknown) Baseline Weight: Weight: 58.3 kg Most recent weight: Weight: 57.9 kg     Palliative Assessment/Data: PPS 40%   Flowsheet Rows     Most Recent Value  Intake Tab  Referral Department  Hospitalist  Unit at Time of Referral  ER  Palliative Care Primary Diagnosis  Cardiac  Date Notified  12/12/18  Palliative Care Type  New Palliative care  Reason for referral  Clarify Goals of Care  Date of Admission  12/12/18  # of days IP prior to Palliative referral  0  Clinical Assessment  Psychosocial & Spiritual Assessment  Palliative Care Outcomes      Time Total: 80 minutes Greater than 50%  of this time was spent counseling and coordinating care related to the above assessment and plan.  Juel Burrow, DNP, AGNP-C Palliative Medicine Team [phone] Pager: [phone]

## 2018-12-12 NOTE — Progress Notes (Signed)
Pharmacy Antibiotic Note  Rebecca Ford is a 85 y.o. female admitted on 12/11/2018 with pneumonia.  Pharmacy has been consulted for Vancomycin/Cefepime dosing. WBC WNL. Renal function age appropriate.   Plan: Vancomycin 750 mg IV q24h -Estimated AUC: 478 Cefepime 1g IV q24h Trend WBC, temp, renal function  F/U infectious work-up Drug levels as indicated   Height: 5\' 2"  (157.5 cm) Weight: 128 lb 9.6 oz (58.3 kg) IBW/kg (Calculated) : 50.1  Temp (24hrs), Avg:97.9 F (36.6 C), Min:97.9 F (36.6 C), Max:97.9 F (36.6 C)  Recent Labs  Lab 12/11/18 2022  WBC 10.2  CREATININE 0.92    Estimated Creatinine Clearance: 34.7 mL/min (by C-G formula based on SCr of 0.92 mg/dL).    Allergies  Allergen Reactions  . Codeine Other (See Comments)    Makes her severely depressed     Narda Bonds 12/12/2018 2:01 AM

## 2018-12-12 NOTE — Progress Notes (Signed)
  Echocardiogram 2D Echocardiogram has been performed.  Rebecca Ford L Androw 12/12/2018, 3:37 PM

## 2018-12-12 NOTE — H&P (Signed)
History and Physical    Rebecca Ford OXB:[id number] DOB: 22-Jul-1930 DOA: 12/11/2018  PCP: [name], MD  Patient coming from: [place].  Chief Complaint: Shortness of breath.  HPI: Rebecca Ford is a 85 y.o. female with history of recently diagnosed diastolic CHF last EF measured in October 2019 was 2 to 65% with grade 1 diastolic dysfunction, anemia on iron supplements, dementia was also recently admitted for skin rash from probably reaction to Megace was found to be having increasing shortness of breath after a choking episode 2 days ago.  Per patient's daughter who provided the history patient was having lunch following which patient started having gagging and possibly had a vomiting episode but patient swallowed it.  Since then patient has been having some difficulty breathing.  Patient has become largely bedbound.  Uses wheelchair for ambulation.  At the same time patient also has been noticing increasing swelling in the left lower extremity.  And patient also over the last 3 weeks has gained 15 pounds from baseline as per the daughter.  Takes like 20 mg of Lasix every day.  ED Course: In the ER chest x-ray shows consolidation in the right lung with some pleural effusion.  EKG was showing sinus tachycardia.  Troponin was mildly elevated.  BNP was 67.  On exam patient has significant swelling of the left lower extremity.  Given the picture patient was started on empiric antibiotics for pneumonia and also since patient is having increasing weight gain with known history of CHF IV Lasix was ordered by me.  Doppler of the left lower extremity has been ordered.  D-dimer is pending.  Review of Systems: As per HPI, rest all negative.   Past Medical History:  Diagnosis Date  . Arthritis   . CAD (coronary artery disease)    s/p cath 09/07/10 w/3 vessel CAD, DES in RCA, Diagonal & LAD  . Dementia ([place])   . HTN (hypertension)   . Hyperlipemia   . MI (myocardial infarction)  ([place])    Non-ST elevation 9/11  . Primary osteoarthritis of right knee 09/2016    Past Surgical History:  Procedure Laterality Date  . CARDIAC CATHETERIZATION     YRS AGO   . CATARACT EXTRACTION W/ INTRAOCULAR LENS  IMPLANT, BILATERAL    . PARTIAL HYSTERECTOMY    . RETINAL DETACHMENT SURGERY     LEFT  . TOTAL KNEE ARTHROPLASTY Right 11/13/2016   Procedure: TOTAL KNEE ARTHROPLASTY;  Surgeon: Frederik Pear, MD;  Location: [facility];  Service: Orthopedics;  Laterality: Right;     reports that she has never smoked. She has never used smokeless tobacco. She reports that she does not drink alcohol or use drugs.  Allergies  Allergen Reactions  . Codeine Other (See Comments)    Makes her severely depressed     Family History  Problem Relation Age of Onset  . Heart attack Mother   . Coronary artery disease Other        diagnosis of CAD but not premature diagnosis  . Coronary artery disease Brother     Prior to Admission medications   Medication Sig Start Date End Date Taking? Authorizing Provider  acetaminophen (TYLENOL) 325 MG tablet Take 650 mg by mouth every 6 (six) hours as needed for mild pain.   Yes [provider]  aspirin EC 81 MG tablet Take 1 tablet (81 mg total) by mouth daily. 05/03/17  Yes Burnell Blanks, MD  cetirizine (ZYRTEC) 10 MG tablet  Take 10 mg by mouth daily.   Yes [provider]  diltiazem (CARDIZEM) 30 MG tablet Take 1 tablet (30 mg total) by mouth every 12 (twelve) hours. Patient taking differently: Take 30 mg by mouth 2 (two) times daily.  10/09/18 12/11/18 Yes Arrien, Jimmy Picket, MD  Ferrous Gluconate (IRON 27 PO) Take 1 tablet by mouth daily.   Yes [provider]  furosemide (LASIX) 20 MG tablet Take 20 mg by mouth every morning.   Yes [provider]  LORazepam (ATIVAN) 0.5 MG tablet Take 0.5 mg by mouth 3 (three) times daily.   Yes [provider]  metoprolol (LOPRESSOR) 50 MG tablet Take 50 mg by mouth  2 (two) times daily.  03/28/14  Yes [provider]  Multiple Vitamins-Minerals (PRESERVISION AREDS PO) Take 1 tablet by mouth 2 (two) times daily.   Yes [provider]  nitroGLYCERIN (NITROSTAT) 0.4 MG SL tablet Place 1 tablet (0.4 mg total) under the tongue every 5 (five) minutes as needed for chest pain. 06/04/14  Yes Burnell Blanks, MD  pantoprazole (PROTONIX) 40 MG tablet Take 1 tablet (40 mg total) by mouth 2 (two) times daily. 10/09/18 12/11/18 Yes Arrien, Jimmy Picket, MD  polyethylene glycol [facility] / Floria Raveling) packet Take 17 g by mouth daily.   Yes [provider]  traZODone (DESYREL) 50 MG tablet Take 50 mg by mouth at bedtime as needed for sleep.   Yes [provider]    Physical Exam: Vitals:   12/11/18 2300 12/11/18 2330 12/12/18 0015 12/12/18 0115  BP: 140/74 (!) 144/70 (!) 144/81 (!) 157/86  Pulse: (!) 109 (!) 109 (!) 109 (!) 110  Resp: (!) 27 (!) 28 (!) 23 (!) 24  Temp:      TempSrc:      SpO2: 92% 92% 90% 91%  Weight:      Height:          Constitutional: Moderately built and nourished. Vitals:   12/11/18 2300 12/11/18 2330 12/12/18 0015 12/12/18 0115  BP: 140/74 (!) 144/70 (!) 144/81 (!) 157/86  Pulse: (!) 109 (!) 109 (!) 109 (!) 110  Resp: (!) 27 (!) 28 (!) 23 (!) 24  Temp:      TempSrc:      SpO2: 92% 92% 90% 91%  Weight:      Height:       Eyes: Anicteric no pallor. ENMT: No discharge from the ears eyes nose or mouth. Neck: No mass felt.  No neck rigidity. Respiratory: No rhonchi or crepitations. Cardiovascular: S1-S2 heard. Abdomen: Soft nontender bowel sounds present. Musculoskeletal: Edema both lower extremities more on the left side. Skin: Chronic skin changes. Neurologic: Alert awake oriented to time and place.  Moves all extremities.  Psychiatric: Has memory issues for recent event.   Labs on Admission: I have personally reviewed following labs and imaging studies  CBC: Recent Labs  Lab  12/11/18 2022  WBC 10.2  NEUTROABS 7.6  HGB 11.8*  HCT 37.7  MCV 101.3*  PLT 628   Basic Metabolic Panel: Recent Labs  Lab 12/11/18 2022  NA 137  K 3.2*  CL 101  CO2 23  GLUCOSE 99  BUN 16  CREATININE 0.92  CALCIUM 8.9   GFR: Estimated Creatinine Clearance: 34.7 mL/min (by C-G formula based on SCr of 0.92 mg/dL). Liver Function Tests: Recent Labs  Lab 12/11/18 2022  AST 28  ALT 8  ALKPHOS 153*  BILITOT 0.6  PROT 6.5  ALBUMIN 2.5*  No results for input(s): LIPASE, AMYLASE in the last 168 hours. No results for input(s): AMMONIA in the last 168 hours. Coagulation Profile: No results for input(s): INR, PROTIME in the last 168 hours. Cardiac Enzymes: Recent Labs  Lab 12/11/18 2022  TROPONINI 0.06*   BNP (last 3 results) No results for input(s): PROBNP in the last 8760 hours. HbA1C: No results for input(s): HGBA1C in the last 72 hours. CBG: No results for input(s): GLUCAP in the last 168 hours. Lipid Profile: No results for input(s): CHOL, HDL, LDLCALC, TRIG, CHOLHDL, LDLDIRECT in the last 72 hours. Thyroid Function Tests: No results for input(s): TSH, T4TOTAL, FREET4, T3FREE, THYROIDAB in the last 72 hours. Anemia Panel: No results for input(s): VITAMINB12, FOLATE, FERRITIN, TIBC, IRON, RETICCTPCT in the last 72 hours. Urine analysis:    Component Value Date/Time   COLORURINE YELLOW 10/25/2018 0308   APPEARANCEUR CLEAR 10/25/2018 0308   LABSPEC 1.015 10/25/2018 0308   PHURINE 5.0 10/25/2018 0308   GLUCOSEU NEGATIVE 10/25/2018 0308   HGBUR NEGATIVE 10/25/2018 0308   BILIRUBINUR NEGATIVE 10/25/2018 0308   KETONESUR NEGATIVE 10/25/2018 0308   PROTEINUR NEGATIVE 10/25/2018 0308   NITRITE NEGATIVE 10/25/2018 0308   LEUKOCYTESUR MODERATE (A) 10/25/2018 0308   Sepsis Labs: @LABRCNTIP (procalcitonin:4,lacticidven:4) )No results found for this or any previous visit (from the past 240 hour(s)).   Radiological Exams on Admission: Dg Chest 2 View  Result  Date: 12/11/2018 CLINICAL DATA:  Shortness of breath starting this morning EXAM: CHEST - 2 VIEW COMPARISON:  October 30, 2018 FINDINGS: The heart size and mediastinal contours are within normal limits. There is opacity of the right mid lung and right lung base. There is right pleural effusion. The left lung is clear. The visualized skeletal structures are unremarkable. IMPRESSION: Opacity of the right mid lung and right lung base probably due to a combination of right lung base consolidation with pleural effusion. Underlying pneumonia is not excluded. Electronically Signed   By: Abelardo Diesel M.D.   On: 12/11/2018 21:04    EKG: Independently reviewed.  Sinus tachycardia.  Assessment/Plan Principal Problem:   Acute respiratory failure with hypoxia (HCC) Active Problems:   HYPERTENSION, BENIGN   Macrocytic anemia   CAP (community acquired pneumonia)   Acute on chronic diastolic CHF (congestive heart failure) (HCC)   Acute pulmonary edema ([place])    1. Acute respiratory failure with hypoxia likely a combination of CHF and possible pneumonia.  Patient on empiric antibiotics for pneumonia and I have ordered Lasix 20 mg IV every 12.  Last 2D echo done in October 2019 showed EF of 60 to 55% with grade 1 diastolic dysfunction.  Closely follow intake output and metabolic panel.  Given the left lower extremity swelling and shortness of breath d-dimer has been ordered along with Doppler of the left lower extremity.  If d-dimer is significantly elevated will get CT angiogram of the chest.  Has elevated troponin but denies any chest pain.  We will cycle cardiac markers.  Aspirin. 2. Hypertension on Cardizem and metoprolol. 3. Anemia on iron supplements. 4. Dementia. 5. Possible UTI on empiric antibiotics.  At this time I had long discussion with patient's daughter.  Patient may have aspirated but plan is to keep patient as much as possible comfortable and so family at this time as requested palliative care  and has declined swallow evaluation knowing the risk of aspiration.  But okay to continue with present antibiotics Lasix and care.   DVT prophylaxis: Lovenox. Code Status: DNR. Family Communication:  [name]. Disposition Plan: To be determined. Consults called: Palliative care. Admission status: Inpatient.   [name] MD [facility] Pager [phone].  If 7PM-7AM, please contact night-coverage [email or web address] Password [email or web address]  12/12/2018, 1:48 AM

## 2018-12-12 NOTE — ED Notes (Signed)
Placed pt on purewick for u/a as pt wear briefs normally. [place] sand which and sprite given

## 2018-12-12 NOTE — Progress Notes (Signed)
ANTICOAGULATION CONSULT NOTE - Initial Consult  Pharmacy Consult for heparin Indication: PE/ DVT  Allergies  Allergen Reactions  . Codeine Other (See Comments)    Makes her severely depressed     Patient Measurements: Height: 5\' 2"  (157.5 cm) Weight: 127 lb 10.3 oz (57.9 kg) IBW/kg (Calculated) : 50.1 Heparin Dosing Weight: 57.9kg  Vital Signs: Temp: 97.4 F (36.3 C) (01/02 0426) Temp Source: Oral (01/02 0426) BP: 141/79 (01/02 0426) Pulse Rate: 110 (01/02 0426)  Labs: Recent Labs    12/11/18 2022 12/12/18 0404  HGB 11.8* 11.3*  HCT 37.7 35.8*  PLT 302 259  CREATININE 0.92 0.98  TROPONINI 0.06*  --     Estimated Creatinine Clearance: 32.6 mL/min (by C-G formula based on SCr of 0.98 mg/dL).   Medical History: Past Medical History:  Diagnosis Date  . Arthritis   . CAD (coronary artery disease)    s/p cath 09/07/10 w/3 vessel CAD, DES in RCA, Diagonal & LAD  . Dementia ([place])   . HTN (hypertension)   . Hyperlipemia   . MI (myocardial infarction) ([place])    Non-ST elevation 9/11  . Primary osteoarthritis of right knee 09/2016    Assessment: 71 YOF with bilateral PE on no AC PTA.  H/H low but stable, plts 259.  Goal of Therapy:  Heparin level 0.3-0.7 units/ml Monitor platelets by anticoagulation protocol: Yes   Plan:  Heparin 3500 unit bolus, and gtt at 1000 units/hr F/u 8 hour heparin level Daily heparin level, CBC, s/s bleeding  Bertis Ruddy, PharmD Clinical Pharmacist Please check AMION for all [place] numbers 12/12/2018 8:25 AM

## 2018-12-12 NOTE — Progress Notes (Addendum)
PROGRESS NOTE        PATIENT DETAILS Name: Rebecca Ford Age: 85 y.o. Sex: female Date of Birth: 09-12-1930 Admit Date: 12/11/2018 Admitting Physician [name], MD QJJ:HERDE, Herbie [place], MD  Brief Narrative: Patient is a 85 y.o. female with heart failure, Mancia, failure to thrive syndrome with significant decline in her functional status over the past few months (now mostly bed to wheelchair bound) scented to the hospital with swelling of her left lower extremity and shortness of breath.  Further imaging demonstrated left lower extremity DVT and bilateral pulmonary embolism.  See below for further details.  Subjective: Lying comfortably in bed-during my evaluation-denies any shortness of breath.  No chest pain.  Following most commands-but at times appears to be pleasantly confused.  Assessment/Plan: Acute hypoxic respiratory failure: Likely secondary to pulmonary embolism and aspiration pneumonia.  Attempt to titrate off oxygen-continue IV heparin and empiric IV antimicrobial therapy (see below)  Pulmonary embolism with left lower extremity DVT: Likely provoked-as patient is mostly sedentary-however it appears that she may have a pancreatic mass/malignancy-which probably is contributing as well.  Continue IV heparin-check echocardiogram-although she does not have any clinical features suggestive of RV strain.  Patient/family does not desire aggressive care/procedures-hence will plan on treating DVT with just anticoagulation and not VVS evaluation.  Once work-up is complete and patient tolerates IV heparin for 1-2 days-we will transition to oral anticoagulation  Probable aspiration pneumonia: Patient/family does give a history of choking episode approximately 2 days prior to this hospitalization-apparently patient has had issues with reflux/aspiration in the past.  I do not think she requires vancomycin and Zosyn-we will switch over to Unasyn.  Add  Mucinex-encourage incentive spirometry/flutter valve.  Right-sided pleural effusion: Multiple differentials-including parapneumonic effusion, possible malignant effusion (pancreatic mass), diastolic heart failure and from pulmonary embolism.  Although appears significant on imaging studies-she appears to be asymptomatic.  Both patient/family do not want aggressive care including procedures-hence will monitor and observe.  Already on antibiotics and diuretics.  Chronic diastolic heart failure: Volume status is stable-switch to oral Lasix today.  Pancreatic mass: Seen on CT scan of chest-both patient and family are very clear-they do not want aggressive care including chemotherapy.  Given large VTE burden-would defer any biopsy etc. at this time-as patient probably requires uninterrupted anticoagulation for few weeks/months-furthermore since patient does not want treatment even if this turns out to be cancer-do not think obtaining a biopsy is going to change management.  Both patient/family agree-defer biopsy for now.  Hypertension: Controlled-continue Cardizem and metoprolol  Failure to thrive syndrome: Although she has put on a few pounds recently-overall she has lost weight-has become more frail and deconditioned over the past few months-she is now mostly bed to wheelchair bound.  DNR in place-both patient and daughter are very clear-they would only want to pursue gentle medical treatment.  See above regarding deferring thoracocentesis and pancreatic biopsy.  Daughter asking for palliative care evaluation-she wants patient to avoid repeat hospitalizations in the future if at all possible-await further recommendations from the palliative care team.  DVT Prophylaxis: Full dose anticoagulation with Heparin  Code Status: Full code or DNR  Family Communication: Daughter at bedside  Disposition Plan: Remain inpatient-back to SNF on discharge-likely in the next 1-2 days.  Antimicrobial  agents: Anti-infectives (From admission, onward)   Start     Dose/Rate Route Frequency Ordered  Stop   12/12/18 2200  vancomycin (VANCOCIN) IVPB 750 mg/150 ml premix     750 mg 150 mL/hr over 60 Minutes Intravenous Every 24 hours 12/12/18 0210     12/12/18 2200  ceFEPIme (MAXIPIME) 1 g in sodium chloride 0.9 % 100 mL IVPB     1 g 200 mL/hr over 30 Minutes Intravenous Every 24 hours 12/12/18 0210     12/11/18 2300  vancomycin (VANCOCIN) IVPB 1000 mg/200 mL premix     1,000 mg 200 mL/hr over 60 Minutes Intravenous  Once 12/11/18 2252 12/12/18 0008   12/11/18 2245  ceFEPIme (MAXIPIME) 1 g in sodium chloride 0.9 % 100 mL IVPB     1 g 200 mL/hr over 30 Minutes Intravenous  Once 12/11/18 2239 12/12/18 0053      Procedures: None  CONSULTS:  Palliative care  Time spent: 35 minutes-Greater than 50% of this time was spent in counseling, explanation of diagnosis, planning of further management, and coordination of care.  MEDICATIONS: Scheduled Meds: . aspirin EC  81 mg Oral Daily  . diltiazem  30 mg Oral BID  . ferrous gluconate  324 mg Oral Q breakfast  . furosemide  20 mg Intravenous Q12H  . iopamidol  100 mL Intravenous Once  . iopamidol      . loratadine  10 mg Oral Daily  . LORazepam  0.5 mg Oral TID  . metoprolol tartrate  50 mg Oral BID  . pantoprazole  40 mg Oral BID  . polyethylene glycol  17 g Oral Daily   Continuous Infusions: . ceFEPime (MAXIPIME) IV    . heparin 1,000 Units/hr (12/12/18 6195)  . vancomycin     PRN Meds:.acetaminophen **OR** acetaminophen, nitroGLYCERIN, ondansetron **OR** ondansetron (ZOFRAN) IV, traZODone   PHYSICAL EXAM: Vital signs: Vitals:   12/12/18 0330 12/12/18 0345 12/12/18 0426 12/12/18 0910  BP:   (!) 141/79 (!) 135/55  Pulse: (!) 114 (!) 113 (!) 110 79  Resp: 19 (!) 23 18 (!) 21  Temp:   (!) 97.4 F (36.3 C) 98.1 F (36.7 C)  TempSrc:   Oral Oral  SpO2: 91% 91% 95% 95%  Weight:   57.9 kg   Height:       Filed Weights    12/11/18 1944 12/12/18 0426  Weight: 58.3 kg 57.9 kg   Body mass index is 23.35 kg/m.   General appearance :Awake, alert, not in any distress.  Chronically ill-appearing-frail looking. Eyes:Pink conjunctiva HEENT: Atraumatic and Normocephalic Neck: supple, no JVD. Resp:Good air entry bilaterally, no added sounds  CVS: S1 S2 regular GI: Bowel sounds present, Non tender and not distended with no gaurding, rigidity or rebound.No organomegaly Extremities: Left lower extremity swollen-slightly erythematous Neurology:  speech clear,Non focal, sensation is grossly intact. Musculoskeletal:No digital cyanosis Skin:No Rash, warm and dry Wounds:N/A  I have personally reviewed following labs and imaging studies  LABORATORY DATA: CBC: Recent Labs  Lab 12/11/18 2022 12/12/18 0404  WBC 10.2 7.7  NEUTROABS 7.6  --   HGB 11.8* 11.3*  HCT 37.7 35.8*  MCV 101.3* 100.6*  PLT 302 093    Basic Metabolic Panel: Recent Labs  Lab 12/11/18 2022 12/12/18 0404  NA 137 135  K 3.2* 3.5  CL 101 103  CO2 23 22  GLUCOSE 99 262*  BUN 16 18  CREATININE 0.92 0.98  CALCIUM 8.9 8.6*    GFR: Estimated Creatinine Clearance: 32.6 mL/min (by C-G formula based on SCr of 0.98 mg/dL).  Liver Function Tests: Recent Labs  Lab 12/11/18 2022  AST 28  ALT 8  ALKPHOS 153*  BILITOT 0.6  PROT 6.5  ALBUMIN 2.5*   No results for input(s): LIPASE, AMYLASE in the last 168 hours. No results for input(s): AMMONIA in the last 168 hours.  Coagulation Profile: No results for input(s): INR, PROTIME in the last 168 hours.  Cardiac Enzymes: Recent Labs  Lab 12/11/18 2022  TROPONINI 0.06*    BNP (last 3 results) No results for input(s): PROBNP in the last 8760 hours.  HbA1C: No results for input(s): HGBA1C in the last 72 hours.  CBG: No results for input(s): GLUCAP in the last 168 hours.  Lipid Profile: No results for input(s): CHOL, HDL, LDLCALC, TRIG, CHOLHDL, LDLDIRECT in the last 72  hours.  Thyroid Function Tests: No results for input(s): TSH, T4TOTAL, FREET4, T3FREE, THYROIDAB in the last 72 hours.  Anemia Panel: No results for input(s): VITAMINB12, FOLATE, FERRITIN, TIBC, IRON, RETICCTPCT in the last 72 hours.  Urine analysis:    Component Value Date/Time   COLORURINE YELLOW 12/12/2018 0232   APPEARANCEUR HAZY (A) 12/12/2018 0232   LABSPEC 1.016 12/12/2018 0232   PHURINE 5.0 12/12/2018 0232   GLUCOSEU NEGATIVE 12/12/2018 0232   HGBUR NEGATIVE 12/12/2018 0232   BILIRUBINUR NEGATIVE 12/12/2018 0232   KETONESUR 5 (A) 12/12/2018 0232   PROTEINUR NEGATIVE 12/12/2018 0232   NITRITE NEGATIVE 12/12/2018 0232   LEUKOCYTESUR LARGE (A) 12/12/2018 0232    Sepsis Labs: Lactic Acid, Venous No results found for: LATICACIDVEN  MICROBIOLOGY: No results found for this or any previous visit (from the past 240 hour(s)).  RADIOLOGY STUDIES/RESULTS: Dg Chest 2 View  Result Date: 12/11/2018 CLINICAL DATA:  Shortness of breath starting this morning EXAM: CHEST - 2 VIEW COMPARISON:  October 30, 2018 FINDINGS: The heart size and mediastinal contours are within normal limits. There is opacity of the right mid lung and right lung base. There is right pleural effusion. The left lung is clear. The visualized skeletal structures are unremarkable. IMPRESSION: Opacity of the right mid lung and right lung base probably due to a combination of right lung base consolidation with pleural effusion. Underlying pneumonia is not excluded. Electronically Signed   By: Abelardo Diesel M.D.   On: 12/11/2018 21:04   Ct Angio Chest Pe W Or Wo Contrast  Addendum Date: 12/12/2018   ADDENDUM REPORT: 12/12/2018 08:09 ADDENDUM: Critical Value/emergent results were called by telephone at the time of interpretation on 12/12/2018 at 8:08 am to Dr. [name], who verbally acknowledged these results. Electronically Signed   By: Richardean Sale M.D.   On: 12/12/2018 08:09   Result Date: 12/12/2018 CLINICAL DATA:   Shortness of breath. Admitted for pneumonia. High probability for acute pulmonary embolism. EXAM: CT ANGIOGRAPHY CHEST WITH CONTRAST TECHNIQUE: Multidetector CT imaging of the chest was performed using the standard protocol during bolus administration of intravenous contrast. Multiplanar CT image reconstructions and MIPs were obtained to evaluate the vascular anatomy. CONTRAST:  41mL ISOVUE-370 IOPAMIDOL (ISOVUE-370) INJECTION 76% COMPARISON:  Radiographs 12/11/2018 and 10/30/2018. Chest CTA 10/02/2018. FINDINGS: Cardiovascular: The pulmonary arteries are well opacified with contrast to the level of the subsegmental branches. There is fairly extensive acute pulmonary thromboembolic disease bilaterally with involvement of the lobar and distal branches. No specific evidence of right heart strain at this time. There is atherosclerosis of the aorta, great vessels and coronary arteries. The heart size is normal. There is no pericardial effusion. Mediastinum/Nodes: There are no discretely enlarged mediastinal, hilar or axillary lymph nodes. There is  increased soft tissue nodularity in the right pericardial fat. A 3.3 x 2.2 cm right thyroid or parathyroid nodules again noted on image 25/5. There is a moderate size hiatal hernia. Lungs/Pleura: Large right pleural effusion has mildly enlarged compared with the previous CT. Aside from the nodularity in the right pericardial fat described above, no definite pleural based nodularity. There is no significant pleural fluid on the left. There is progressive right lower lobe atelectasis with near complete collapse. There is also mildly progressive right upper and middle lobe atelectasis. Mild left lower lobe atelectasis appears unchanged. Upper abdomen: Left upper quadrant mass or focal fluid measures 3.5 x 3.8 cm on image 131/5. This is located adjacent to the splenic artery and could involve the pancreatic tail. This is incompletely visualized by this examination. Mild ascites  noted. Musculoskeletal/Chest wall: There is no chest wall mass or suspicious osseous finding. Mild scoliosis and spondylosis noted. Review of the MIP images confirms the above findings. IMPRESSION: 1. Study is positive for acute bilateral pulmonary thromboembolic disease. No signs of right heart strain at this time. 2. Enlarging right pleural effusion with worsening right lower lobe and right middle lobe collapse. 3. Increasing nodularity in the right pericardiac fat, suggesting the possibility of a malignant right pleural effusion. If not previously performed, thoracentesis recommended for further evaluation. No other evidence of thoracic malignancy. 4. Indeterminate mass or complex fluid collection in the left upper quadrant of the abdomen, incompletely evaluated by this examination. This may be arising from the pancreatic tail and could reflect pancreatic malignancy. Further evaluation recommended with abdominopelvic CT using pancreatic protocol once the patient has stabilized. Electronically Signed: By: Richardean Sale M.D. On: 12/12/2018 07:57   Vas [place] Lower Extremity Venous (dvt) (only [facility])  Result Date: 12/12/2018  Lower Venous Study Indications: Swelling.  Performing Technologist: Oliver Hum RVT  Examination Guidelines: A complete evaluation includes B-mode imaging, spectral Doppler, color Doppler, and power Doppler as needed of all accessible portions of each vessel. Bilateral testing is considered an integral part of a complete examination. Limited examinations for reoccurring indications may be performed as noted.  Right Venous Findings: +---+---------------+---------+-----------+----------+-------+    CompressibilityPhasicitySpontaneityPropertiesSummary +---+---------------+---------+-----------+----------+-------+ CFVFull           Yes      Yes                          +---+---------------+---------+-----------+----------+-------+  Left Venous Findings:  +---------+---------------+---------+-----------+----------+-------+          CompressibilityPhasicitySpontaneityPropertiesSummary +---------+---------------+---------+-----------+----------+-------+ CFV      Partial        No       No                   Acute   +---------+---------------+---------+-----------+----------+-------+ SFJ      Full                                                 +---------+---------------+---------+-----------+----------+-------+ FV Prox  Partial                                      Acute   +---------+---------------+---------+-----------+----------+-------+ FV Mid   Full                                                 +---------+---------------+---------+-----------+----------+-------+  FV DistalFull                                                 +---------+---------------+---------+-----------+----------+-------+ PFV      Full                                                 +---------+---------------+---------+-----------+----------+-------+ POP      Full           No       No                           +---------+---------------+---------+-----------+----------+-------+ PTV      Full                                                 +---------+---------------+---------+-----------+----------+-------+ PERO     Full                                                 +---------+---------------+---------+-----------+----------+-------+ EIV      Full           No       No                           +---------+---------------+---------+-----------+----------+-------+ Unable to visualize the common iliac vein due to bowel gas.    Summary: Right: No evidence of common femoral vein obstruction. Left: Findings consistent with acute deep vein thrombosis involving the left common femoral vein, and left femoral vein. No cystic structure found in the popliteal fossa.  *See table(s) above for measurements and observations.     Preliminary      LOS: 0 days   Oren Binet, MD  [facility]  If 7PM-7AM, please contact night-coverage  Please page via [email or web address]-Password [email or web address]-click on MD name and type text message  12/12/2018, 11:53 AM

## 2018-12-13 LAB — CBC
HCT: 35.8 % — ABNORMAL LOW (ref 36.0–46.0)
Hemoglobin: 11.5 g/dL — ABNORMAL LOW (ref 12.0–15.0)
MCH: 32.5 pg (ref 26.0–34.0)
MCHC: 32.1 g/dL (ref 30.0–36.0)
MCV: 101.1 fL — ABNORMAL HIGH (ref 80.0–100.0)
Platelets: 291 10*3/uL (ref 150–400)
RBC: 3.54 MIL/uL — ABNORMAL LOW (ref 3.87–5.11)
RDW: 15 % (ref 11.5–15.5)
WBC: 10 10*3/uL (ref 4.0–10.5)
nRBC: 0 % (ref 0.0–0.2)

## 2018-12-13 LAB — HEPARIN LEVEL (UNFRACTIONATED): Heparin Unfractionated: 0.83 IU/mL — ABNORMAL HIGH (ref 0.30–0.70)

## 2018-12-13 MED ORDER — APIXABAN 5 MG PO TABS: 5.0000 mg | ORAL_TABLET | Freq: Two times a day (BID) | ORAL | Status: DC

## 2018-12-13 MED ORDER — APIXABAN 5 MG PO TABS
10.0000 mg | ORAL_TABLET | Freq: Two times a day (BID) | ORAL | Status: DC
  Administered 2018-12-13 – 2018-12-16 (×7): 10 mg via ORAL
  Filled 2018-12-13 (×7): qty 2

## 2018-12-13 MED ORDER — SODIUM CHLORIDE 0.9 % IV SOLN
INTRAVENOUS | Status: DC | PRN
  Administered 2018-12-13 (×2): 500 mL via INTRAVENOUS

## 2018-12-13 NOTE — Consult Note (Signed)
   [facility] CM Inpatient Consult   12/13/2018  WINNELL BENTO 1930/06/24 [id number]  Patient screened for high risk for unplanned readmissions 23 % with 3 admissions in the past 6 months. Patient was admitted from a  Skilled nursing facility and per documentation.  Chart review reveals patient is admitted with pulmonary emboli as well as probable aspiration pneumonia along with failure to thrive.  MD notes: Although she has put on a few pounds recently-overall she has lost weight-has become more frail and deconditioned over the past few months-she is now mostly bed to wheelchair bound.  DNR in place-both patient and daughter are very clear-they would only want to pursue gentle medical treatment.  See above regarding deferring thoracocentesis and pancreatic biopsy.  Daughter asking for palliative care evaluation-she wants patient to avoid repeat hospitalizations in the future if at all possible-await further recommendations from the palliative care team.  Patient is listed with [facility]. Will follow up with care management team for progress and disposition needs, if appropriate for community follow up.  Her primary care provider is Dr. [name], and this office provides the transition of care follow up.    Please place a [facility] Care Management consult or for questions contact:   Natividad Brood, RN BSN [facility] Liaison  [phone] business mobile phone Toll free office [phone]

## 2018-12-13 NOTE — NC FL2 (Addendum)
[place] LEVEL OF CARE SCREENING TOOL     IDENTIFICATION  Patient Name: Rebecca Ford Birthdate: Jul 07, 1930 Sex: female Admission Date (Current Location): 12/11/2018  [facility] [place] Number:  Herbalist and Address:  [facility], [place] [address], [place], [place] [address]      Provider Number: [id number]  Attending Physician Name and Address:  Margaree Mackintosh. Singh Relative Name and Phone Number:  Kristen Loader - [phone] (home) and [phone] (mobile)    Current Level of Care: Hospital Recommended Level of Care: Assisted Living Facility(From [facility]) Prior Approval Number:    Date Approved/Denied:   PASRR Number:    Discharge Plan: Other (Comment)([facility])    Current Diagnoses: Patient Active Problem List   Diagnosis Date Noted  . Acute respiratory failure with hypoxia ([place]) 12/12/2018  . CAP (community acquired pneumonia) 12/12/2018  . Acute on chronic diastolic CHF (congestive heart failure) ([place]) 12/12/2018  . Acute pulmonary edema ([place]) 12/12/2018  . Aspiration pneumonia of right lung ([place])   . Goals of care, counseling/discussion   . Palliative care by specialist   . Acute renal failure (ARF) ([place]) 10/25/2018  . Rash and nonspecific skin eruption 10/25/2018  . Confusion 10/25/2018  . CHF (congestive heart failure) ([place]) 10/02/2018  . Acute CHF (congestive heart failure) ([place]) 10/01/2018  . Macrocytic anemia 10/01/2018  . Chronic depression 12/03/2016  . Localized osteoarthritis of right knee 11/13/2016  . Primary osteoarthritis of right knee 1Jan 22, 202017  . Syncope 11/16/2015  . 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 HYPERTENSION, BENIGN 09/20/2010  . CAD, NATIVE VESSEL 09/20/2010    Orientation RESPIRATION BLADDER Height & Weight     Self, Situation, Place  No Oxygen Continent Weight: 127 lb 6.8 oz (57.8 kg) Height:  5\' 2"  (157.5 cm)  BEHAVIORAL SYMPTOMS/MOOD NEUROLOGICAL BOWEL  NUTRITION STATUS      Incontinent Diet(Regular with thin liquids)  AMBULATORY STATUS COMMUNICATION OF NEEDS Skin   Total Care(Bed and wheelchair bound) Verbally Normal                       Personal Care Assistance Level of Assistance  Bathing, Feeding, Dressing Bathing Assistance: Maximum assistance Feeding assistance: Independent Dressing Assistance: Maximum assistance     Functional Limitations Info  Sight, Hearing, Speech Sight Info: Adequate Hearing Info: Adequate Speech Info: Adequate    SPECIAL CARE FACTORS FREQUENCY  Speech therapy             Speech Therapy Frequency: Swallow eval 1/3. Speech at ALF - eval and treat      Contractures Contractures Info: Not present    Additional Factors Info  Code Status, Allergies Code Status Info: DNR Allergies Info: Codeine           Current Medications (12/13/2018):  This is the current hospital active medication list Current Facility-Administered Medications  Medication Dose Route Frequency Provider Last Rate Last Dose  . 0.9 %  sodium chloride infusion   Intravenous PRN Jonetta Osgood, MD   Stopped at 12/13/18 1148  . acetaminophen (TYLENOL) tablet 650 mg  650 mg Oral Q6H PRN [name], MD       Or  . acetaminophen (TYLENOL) suppository 650 mg  650 mg Rectal Q6H PRN [name], MD      . Ampicillin-Sulbactam (UNASYN) 3 g in sodium chloride 0.9 % 100 mL IVPB  3 g Intravenous Q8H Pierce, Dwayne A, RPH 200  mL/hr at 12/13/18 1359 3 g at 12/13/18 1359  . apixaban (ELIQUIS) tablet 10 mg  10 mg Oral BID Bertis Ruddy, RPH   10 mg at 12/13/18 1354   Followed by  . [START ON 12/20/2018] apixaban (ELIQUIS) tablet 5 mg  5 mg Oral BID Bertis Ruddy, RPH      . diltiazem (CARDIZEM) tablet 30 mg  30 mg Oral BID [name], MD   30 mg at 12/13/18 1055  . ferrous gluconate (FERGON) tablet 324 mg  324 mg Oral Q breakfast [name], MD   324 mg at 12/13/18 1287  . furosemide (LASIX)  tablet 20 mg  20 mg Oral Arnetha Courser, MD   20 mg at 12/13/18 0806  . guaiFENesin (MUCINEX) 12 hr tablet 600 mg  600 mg Oral BID Jonetta Osgood, MD   600 mg at 12/13/18 1054  . iopamidol (ISOVUE-370) 76 % injection 100 mL  100 mL Intravenous Once [name], MD      . loratadine (CLARITIN) tablet 10 mg  10 mg Oral Daily [name], MD   10 mg at 12/13/18 1054  . LORazepam (ATIVAN) tablet 0.5 mg  0.5 mg Oral TID [name], MD   0.5 mg at 12/13/18 1652  . metoprolol tartrate (LOPRESSOR) tablet 50 mg  50 mg Oral BID [name], MD   50 mg at 12/13/18 1054  . nitroGLYCERIN (NITROSTAT) SL tablet 0.4 mg  0.4 mg Sublingual Q5 min PRN [name], MD      . ondansetron [facility]) tablet 4 mg  4 mg Oral Q6H PRN [name], MD       Or  . ondansetron [facility]) injection 4 mg  4 mg Intravenous Q6H PRN [name], MD      . pantoprazole (PROTONIX) EC tablet 40 mg  40 mg Oral BID [name], MD   40 mg at 12/13/18 1054  . polyethylene glycol (MIRALAX / GLYCOLAX) packet 17 g  17 g Oral Daily [name], MD      . traZODone (DESYREL) tablet 50 mg  50 mg Oral QHS PRN [name], MD   50 mg at 12/12/18 2129     Discharge Medications: Please see discharge summary for a list of discharge medications.  Relevant Imaging Results:  Relevant Lab Results:   Additional Information *Hospice Consult with [facility] [phone].  Discharge medications:  STOP taking these medications   aspirin EC 81 MG tablet     TAKE these medications   acetaminophen 325 MG tablet Commonly known as:  TYLENOL Take 650 mg by mouth every 6 (six) hours as needed for mild pain.   apixaban 5 MG Tabs tablet Commonly known as:  ELIQUIS Take 2 tablets (10 mg total) by mouth 2 (two) times daily for 5 days. Stop on 1/9, and then transition to 5 mg twice daily on 1/10   apixaban 5 MG  Tabs tablet Commonly known as:  ELIQUIS Take 1 tablet (5 mg total) by mouth 2 (two) times daily. Start taking on:  December 20, 2018   cetirizine 10 MG tablet Commonly known as:  ZYRTEC Take 10 mg by mouth daily.   diltiazem 30 MG tablet Commonly known as:  CARDIZEM Take 1 tablet (30 mg total) by mouth every 12 (twelve) hours. What changed:  when to take this   furosemide 20 MG tablet Commonly known as:  LASIX  Take 20 mg by mouth every morning.   IRON 27 PO Take 1 tablet by mouth daily.   LORazepam 0.5 MG tablet Commonly known as:  ATIVAN Take 1 tablet (0.5 mg total) by mouth 3 (three) times daily.   metoprolol tartrate 50 MG tablet Commonly known as:  LOPRESSOR Take 50 mg by mouth 2 (two) times daily.   nitroGLYCERIN 0.4 MG SL tablet Commonly known as:  NITROSTAT Place 1 tablet (0.4 mg total) under the tongue every 5 (five) minutes as needed for chest pain.   pantoprazole 40 MG tablet Commonly known as:  PROTONIX Take 1 tablet (40 mg total) by mouth 2 (two) times daily.   polyethylene glycol packet Commonly known as:  MIRALAX / GLYCOLAX Take 17 g by mouth daily.   PRESERVISION AREDS PO Take 1 tablet by mouth 2 (two) times daily.   traZODone 50 MG tablet Commonly known as:  DESYREL Take 50 mg by mouth at bedtime as needed for sleep.      Sable Feil, LCSW

## 2018-12-13 NOTE — Discharge Instructions (Signed)
Information on my medicine - ELIQUIS (apixaban)  This medication education was reviewed with me or my healthcare representative as part of my discharge preparation.   Why was Eliquis prescribed for you? Eliquis was prescribed to treat blood clots that may have been found in the veins of your legs (deep vein thrombosis) or in your lungs (pulmonary embolism) and to reduce the risk of them occurring again.  What do You need to know about Eliquis ? The starting dose is 10 mg (two 5 mg tablets) taken TWICE daily for the FIRST SEVEN (7) DAYS, then on 1/10  the dose is reduced to ONE 5 mg tablet taken TWICE daily.  Eliquis may be taken with or without food.   Try to take the dose about the same time in the morning and in the evening. If you have difficulty swallowing the tablet whole please discuss with your pharmacist how to take the medication safely.  Take Eliquis exactly as prescribed and DO NOT stop taking Eliquis without talking to the doctor who prescribed the medication.  Stopping may increase your risk of developing a new blood clot.  Refill your prescription before you run out.  After discharge, you should have regular check-up appointments with your healthcare provider that is prescribing your Eliquis.    What do you do if you miss a dose? If a dose of ELIQUIS is not taken at the scheduled time, take it as soon as possible on the same day and twice-daily administration should be resumed. The dose should not be doubled to make up for a missed dose.  Important Safety Information A possible side effect of Eliquis is bleeding. You should call your healthcare provider right away if you experience any of the following: ? Bleeding from an injury or your nose that does not stop. ? Unusual colored urine (red or dark brown) or unusual colored stools (red or black). ? Unusual bruising for unknown reasons. ? A serious fall or if you hit your head (even if there is no bleeding).  Some  medicines may interact with Eliquis and might increase your risk of bleeding or clotting while on Eliquis. To help avoid this, consult your healthcare provider or pharmacist prior to using any new prescription or non-prescription medications, including herbals, vitamins, non-steroidal anti-inflammatory drugs (NSAIDs) and supplements.  This website has more information on Eliquis (apixaban): http://www.eliquis.com/eliquis/home

## 2018-12-13 NOTE — Clinical Social Work Note (Signed)
Clinical Social Work Assessment  Patient Details  Name: Rebecca Ford MRN: [id number] Date of Birth: [birth year]  Date of referral:  12/13/18               Reason for consult:  Discharge Planning                Permission sought to share information with:  Family Supports Permission granted to share information::  Yes, Verbal Permission Granted  Name::     Kristen Loader  Agency::     Relationship::  Daughter  Contact Information:  [phone] (home); [phone] (mobile)  Housing/Transportation Living arrangements for the past 2 months:  Assisted Living Facility([facility] - admitted to ALF 11/22/18) Source of Information:  Adult Children(Daughter Wemdy) Patient Interpreter Needed:  None Criminal Activity/Legal Involvement Pertinent to Current Situation/Hospitalization:  No - Comment as needed Significant Relationships:  Adult Children Lives with:  Facility Resident([facility]) Do you feel safe going back to the place where you live?  Yes Need for family participation in patient care:  Yes (Comment)  Care giving concerns:  Daughter expressed concerns regarding care of her mother and communication with the nurses. Daughter very satisfied with the care her mother has received from other staff - nurse techs.  Social Worker assessment / plan: CSW talked with patient and daughter at the bedside. Patient was awake and alert, and greeted CSW, but was quiet during the conversation with daughter, as she was eating her dinner. Daughter provided CSW with history of how her mom got to [facility]. Per daughter, her mom has been there since 11/22/18. Daughter reported that prior to this her mom was at [facility] and has been to a couple of SNF's for rehab. Daughter explained that her mom had a total life change from being independent and driving to needed help with all ADL's except feeding herself. Ms. Ernst Spell met with Palliative Care staff today and the plan is  for her mother to return to [facility] with [facility].  Employment status:  Retired Nurse, adult PT Recommendations:  Not assessed at this time Information / Referral to community resources:  Other (Comment Required)(Information not needed or requested as patient is returning to ALF)  Patient/Family's Response to care:  Daughter expressed no concerns regarding the care her mother has received during hospitalization.  Patient/Family's Understanding of and Emotional Response to Diagnosis, Current Treatment, and Prognosis:  Daughter expressed understanding of her mother's declining medical status and wants her comfortable once she returns to the ALF.  Emotional Assessment Appearance:  Appears stated age Attitude/Demeanor/Rapport:  Other(Quiet) Affect (typically observed):  Quiet Orientation:  Oriented to Self, Oriented to Place, Oriented to Situation Alcohol / Substance use:  Never Used Psych involvement (Current and /or in the community):  No (Comment)  Discharge Needs  Concerns to be addressed:  Discharge Planning Concerns Readmission within the last 30 days:  No Current discharge risk:  None Barriers to Discharge:  Continued Medical Work up   [employer] Mila Homer, [place] 12/13/2018, 6:28 PM

## 2018-12-13 NOTE — Evaluation (Signed)
Clinical/Bedside Swallow Evaluation Patient Details  Name: Rebecca Ford MRN: [id number] Date of Birth: 11-03-1930  Today's Date: 12/13/2018 Time: SLP Start Time (ACUTE ONLY): 6789 SLP Stop Time (ACUTE ONLY): 0907 SLP Time Calculation (min) (ACUTE ONLY): 15 min  Past Medical History:  Past Medical History:  Diagnosis Date  . Arthritis   . CAD (coronary artery disease)    s/p cath 09/07/10 w/3 vessel CAD, DES in RCA, Diagonal & LAD  . Dementia ([place])   . HTN (hypertension)   . Hyperlipemia   . MI (myocardial infarction) ([place])    Non-ST elevation 9/11  . Primary osteoarthritis of right knee 09/2016   Past Surgical History:  Past Surgical History:  Procedure Laterality Date  . CARDIAC CATHETERIZATION     YRS AGO   . CATARACT EXTRACTION W/ INTRAOCULAR LENS  IMPLANT, BILATERAL    . PARTIAL HYSTERECTOMY    . RETINAL DETACHMENT SURGERY     LEFT  . TOTAL KNEE ARTHROPLASTY Right 11/13/2016   Procedure: TOTAL KNEE ARTHROPLASTY;  Surgeon: Frederik Pear, MD;  Location: [facility];  Service: Orthopedics;  Laterality: Right;   HPI:  [name] an 85 y.o. female  with past medical history of CAD, HTN. HLD, CHF, failure to thrive, anemia, and dementia admitted on 12/11/2018 with increasing shortness of breath after possible aspiration episode. CXR showed consolidation in the R lung with some pleural effusion. Imaging also demonstrated left lower extremity DVT and bilateral pulmonary embolism, concern for pancreatic mass. Pt has previously been evaluated by SLP clinically with concern for primary esophageal dysphagia (esophagram 09/2018 has showed a large hiatal hernia, dysmotility). Thin liquids have been recommended in the past with up to regular diet textures dependent on how symptomatic she was at the time.    Assessment / Plan / Recommendation Clinical Impression  Pt has throat clearing observed across consistencies but no overt coughing even when taking sequential boluses. Pt has a known h/o esophageal  dysphagia, which could account for current presentation, although MBS would be necessary to confirm this. Per chart review and discussion with RN, not sure if this would be within pt/family's GOC (meeting pending this morning with palliative care and family). Pt's difficulty swallowing in the past has been primarily esophageal, also making risk for aspiration likely more chronic even with diet alterations. She does have mild-moderate oral residue with solids that she is very aware of (and somewhat self-conscious of) and for which she compensates with lingual sweep. Recommend continuing regular diet and thin liquids with use of aspiration and esophageal precautions to reduce the risk of aspiration. Would particularly emphasize good oral care in light of residue and sitting upright during/after meals (pt wanted to lie down immediately following breakfast but stayed up with encouragement from SLP). Will f/u as indicated. SLP Visit Diagnosis: Dysphagia, unspecified (R13.10)    Aspiration Risk  Moderate aspiration risk    Diet Recommendation Regular;Thin liquid   Liquid Administration via: Cup;Straw Medication Administration: Whole meds with liquid Supervision: Patient able to self feed;Intermittent supervision to cue for compensatory strategies Compensations: Slow rate;Small sips/bites;Lingual sweep for clearance of pocketing Postural Changes: Seated upright at 90 degrees;Remain upright for at least 30 minutes after po intake    Other  Recommendations Oral Care Recommendations: Oral care BID   Follow up Recommendations (tba)      Frequency and Duration min 2x/week  1 week       Prognosis        Swallow Study   General HPI: Pt  is an 85 y.o. female  with past medical history of CAD, HTN. HLD, CHF, failure to thrive, anemia, and dementia admitted on 12/11/2018 with increasing shortness of breath after possible aspiration episode. CXR showed consolidation in the R lung with some pleural effusion.  Imaging also demonstrated left lower extremity DVT and bilateral pulmonary embolism, concern for pancreatic mass. Pt has previously been evaluated by SLP clinically with concern for primary esophageal dysphagia (esophagram 09/2018 has showed a large hiatal hernia, dysmotility). Thin liquids have been recommended in the past with up to regular diet textures dependent on how symptomatic she was at the time.  Type of Study: Bedside Swallow Evaluation Previous Swallow Assessment: see HPI Diet Prior to this Study: Regular;Thin liquids Temperature Spikes Noted: No Respiratory Status: Nasal cannula History of Recent Intubation: No Behavior/Cognition: Alert;Cooperative;Requires cueing Oral Cavity Assessment: Other (comment)(food particles) Oral Care Completed by SLP: No Oral Cavity - Dentition: Adequate natural dentition Vision: Functional for self-feeding Self-Feeding Abilities: Able to feed self Patient Positioning: Upright in bed Baseline Vocal Quality: Normal Volitional Cough: Strong Volitional Swallow: Able to elicit    Oral/Motor/Sensory Function Overall Oral Motor/Sensory Function: Within functional limits   Ice Chips Ice chips: Not tested   Thin Liquid Thin Liquid: Impaired Presentation: Cup;Self Fed Pharyngeal  Phase Impairments: Throat Clearing - Immediate;Throat Clearing - Delayed    Nectar Thick Nectar Thick Liquid: Not tested   Honey Thick Honey Thick Liquid: Not tested   Puree Puree: Not tested   Solid     Solid: Impaired Presentation: Self Fed Oral Phase Functional Implications: Oral residue Pharyngeal Phase Impairments: Throat Clearing - Delayed      Rebecca Ford 12/13/2018,9:43 AM  Rebecca Ford, M.A. [place] Acute Environmental education officer [phone] Office [phone]
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
## 2018-12-13 NOTE — Progress Notes (Signed)
ANTICOAGULATION CONSULT NOTE - Initial Consult  Pharmacy Consult for heparin>apixaban Indication: PE/ DVT  Allergies  Allergen Reactions  . Codeine Other (See Comments)    Makes her severely depressed     Patient Measurements: Height: 5\' 2"  (157.5 cm) Weight: 127 lb 6.8 oz (57.8 kg) IBW/kg (Calculated) : 50.1 Heparin Dosing Weight: 57.9kg  Vital Signs: Temp: 97.5 F (36.4 C) (01/03 0925) Temp Source: Oral (01/03 0925) BP: 148/70 (01/03 0925) Pulse Rate: 90 (01/03 0925)  Labs: Recent Labs    12/11/18 2022 12/12/18 0404 12/12/18 1624  HGB 11.8* 11.3*  --   HCT 37.7 35.8*  --   PLT 302 259  --   HEPARINUNFRC  --   --  1.04*  CREATININE 0.92 0.98  --   TROPONINI 0.06*  --   --     Estimated Creatinine Clearance: 32.6 mL/min (by C-G formula based on SCr of 0.98 mg/dL).   Medical History: Past Medical History:  Diagnosis Date  . Arthritis   . CAD (coronary artery disease)    s/p cath 09/07/10 w/3 vessel CAD, DES in RCA, Diagonal & LAD  . Dementia ([place])   . HTN (hypertension)   . Hyperlipemia   . MI (myocardial infarction) ([place])    Non-ST elevation 9/11  . Primary osteoarthritis of right knee 09/2016    Assessment: 74 YOF with bilateral PE on no AC PTA.  H/H low but stable, plts 259. SCr 0.98, CrCl ~ 29mL/min  Goal of Therapy:  Heparin level 0.3-0.7 units/ml Monitor platelets by anticoagulation protocol: Yes   Plan:  D/c heparin gtt  apixaban 10mg  BID x 7 days, then 5mg  daily thereafter  Bertis Ruddy, PharmD Clinical Pharmacist Please check AMION for all [place] numbers 12/13/2018 11:26 AM

## 2018-12-13 NOTE — Progress Notes (Signed)
PROGRESS NOTE        PATIENT DETAILS Name: Rebecca Ford Age: 85 y.o. Sex: female Date of Birth: 1930/03/29 Admit Date: 12/11/2018 Admitting Physician [name], MD JYN:WGNFA, Herbie [place], MD  Brief Narrative: Patient is a 85 y.o. female with heart failure, M[place], failure to thrive syndrome with significant decline in her functional status over the past few months (now mostly bed to wheelchair bound) scented to the hospital with swelling of her left lower extremity and shortness of breath.  Further imaging demonstrated left lower extremity DVT and bilateral pulmonary embolism.  See below for further details.  Subjective: No shortness of breath-lying comfortably in bed.  Assessment/Plan: Acute hypoxic respiratory failure: Likely secondary to pulmonary embolism and aspiration pneumonia.  Attempt to titrate off oxygen.  Continue anticoagulation and empiric antimicrobial therapy.  Pulmonary embolism with left lower extremity DVT: Likely provoked-as patient is mostly sedentary-however patient appears to have a pancreatic mass/malignancy which may be contributing.  Tolerating IV heparin well-no clinical features of RV strain-awaiting echocardiogram.  Patient and family does not desire aggressive care-hence will not pursue the VS evaluation for left lower extremity swelling/DVT.  Will switch over to Eliquis today.  Probable aspiration pneumonia: Patient/family does give a history of choking episode approximately 2 days prior to this hospitalization-apparently patient has had issues with reflux/aspiration in the past.  Antimicrobial therapy has been narrowed to [facility] Mucinex/incentive spirometry/flutter valve.  Speech therapy evaluation completed-continue regular diet.  Right-sided pleural effusion: Multiple differentials-including parapneumonic effusion, possible malignant effusion (pancreatic mass), diastolic heart failure and from pulmonary embolism.   Although appears significant on imaging studies-she appears to be asymptomatic.  Both patient/family do not want aggressive care including procedures-hence will monitor and observe.  Already on antibiotics and diuretics.  Chronic diastolic heart failure: Volume status is stable-continue oral Lasix.  Pancreatic mass: Seen on CT scan of chest-both patient and family are very clear-they do not want aggressive care including chemotherapy.  Given large VTE burden-would defer any biopsy etc. at this time-as patient probably requires uninterrupted anticoagulation for few weeks/months-furthermore since patient does not want treatment even if this turns out to be cancer-do not think obtaining a biopsy is going to change management.  Both patient/family agree-defer any plans for biopsy at this point.   Hypertension: Controlled-continue Cardizem and metoprolol.  Failure to thrive syndrome: Although she has put on a few pounds recently-overall she has lost weight-has become more frail and deconditioned over the past few months-she is now mostly bed to wheelchair bound.  DNR in place-both patient and daughter are very clear-they would only want to pursue gentle medical treatment.  See above regarding deferring thoracocentesis and pancreatic biopsy.  Daughter asking for palliative care evaluation-she wants patient to avoid repeat hospitalizations in the future if at all possible-await further recommendations from the palliative care team.  DVT Prophylaxis: Full dose anticoagulation with Heparin-we will switch over to Eliquis today  Code Status: DNR  Family Communication: None at bedside  Disposition Plan: Remain inpatient-back to SNF on discharge-likely in the next 1-2 days.  Antimicrobial agents: Anti-infectives (From admission, onward)   Start     Dose/Rate Route Frequency Ordered Stop   12/12/18 2200  vancomycin (VANCOCIN) IVPB 750 mg/150 ml premix  Status:  Discontinued     750 mg 150 mL/hr over 60  Minutes Intravenous Every 24 hours 12/12/18 0210 12/12/18  1209   12/12/18 2200  ceFEPIme (MAXIPIME) 1 g in sodium chloride 0.9 % 100 mL IVPB  Status:  Discontinued     1 g 200 mL/hr over 30 Minutes Intravenous Every 24 hours 12/12/18 0210 12/12/18 1209   12/12/18 1245  Ampicillin-Sulbactam (UNASYN) 3 g in sodium chloride 0.9 % 100 mL IVPB     3 g 200 mL/hr over 30 Minutes Intravenous Every 8 hours 12/12/18 1232     12/11/18 2300  vancomycin (VANCOCIN) IVPB 1000 mg/200 mL premix     1,000 mg 200 mL/hr over 60 Minutes Intravenous  Once 12/11/18 2252 12/12/18 0008   12/11/18 2245  ceFEPIme (MAXIPIME) 1 g in sodium chloride 0.9 % 100 mL IVPB     1 g 200 mL/hr over 30 Minutes Intravenous  Once 12/11/18 2239 12/12/18 0053      Procedures: None  CONSULTS:  Palliative care  Time spent: 25 minutes-Greater than 50% of this time was spent in counseling, explanation of diagnosis, planning of further management, and coordination of care.  MEDICATIONS: Scheduled Meds: . aspirin EC  81 mg Oral Daily  . diltiazem  30 mg Oral BID  . ferrous gluconate  324 mg Oral Q breakfast  . furosemide  20 mg Oral BH-q7a  . guaiFENesin  600 mg Oral BID  . iopamidol  100 mL Intravenous Once  . loratadine  10 mg Oral Daily  . LORazepam  0.5 mg Oral TID  . metoprolol tartrate  50 mg Oral BID  . pantoprazole  40 mg Oral BID  . polyethylene glycol  17 g Oral Daily   Continuous Infusions: . sodium chloride 500 mL (12/13/18 0814)  . ampicillin-sulbactam (UNASYN) IV 3 g (12/13/18 0500)  . heparin 800 Units/hr (12/13/18 0813)   PRN Meds:.sodium chloride, acetaminophen **OR** acetaminophen, nitroGLYCERIN, ondansetron **OR** ondansetron (ZOFRAN) IV, traZODone   PHYSICAL EXAM: Vital signs: Vitals:   12/12/18 1731 12/12/18 2116 12/13/18 0442 12/13/18 0925  BP: 140/69 (!) 149/70 129/66 (!) 148/70  Pulse: (!) 101 95 80 90  Resp: 20 18 18 20   Temp: 98.2 F (36.8 C) 97.7 F (36.5 C) 98 F (36.7 C) (!) 97.5  F (36.4 C)  TempSrc: Oral Oral  Oral  SpO2: 96% 100% 97% 100%  Weight:  57.8 kg    Height:       Filed Weights   12/11/18 1944 12/12/18 0426 12/12/18 2116  Weight: 58.3 kg 57.9 kg 57.8 kg   Body mass index is 23.31 kg/m.   General appearance:Awake, alert, not in any distress.  Eyes:no scleral icterus. HEENT: Atraumatic and Normocephalic Neck: supple, no JVD. Resp:Good air entry bilaterally,no rales or rhonchi CVS: S1 S2 regular, no murmurs.  GI: Bowel sounds present, Non tender and not distended with no gaurding, rigidity or rebound. Extremities: Left lower extremity with some mild swelling.   Neurology:  Non focal Psychiatric: Normal judgment and insight. Normal mood. Musculoskeletal:No digital cyanosis Skin:No Rash, warm and dry Wounds:N/A  I have personally reviewed following labs and imaging studies  LABORATORY DATA: CBC: Recent Labs  Lab 12/11/18 2022 12/12/18 0404  WBC 10.2 7.7  NEUTROABS 7.6  --   HGB 11.8* 11.3*  HCT 37.7 35.8*  MCV 101.3* 100.6*  PLT 302 093    Basic Metabolic Panel: Recent Labs  Lab 12/11/18 2022 12/12/18 0404  NA 137 135  K 3.2* 3.5  CL 101 103  CO2 23 22  GLUCOSE 99 262*  BUN 16 18  CREATININE 0.92 0.98  CALCIUM  8.9 8.6*    GFR: Estimated Creatinine Clearance: 32.6 mL/min (by C-G formula based on SCr of 0.98 mg/dL).  Liver Function Tests: Recent Labs  Lab 12/11/18 2022  AST 28  ALT 8  ALKPHOS 153*  BILITOT 0.6  PROT 6.5  ALBUMIN 2.5*   No results for input(s): LIPASE, AMYLASE in the last 168 hours. No results for input(s): AMMONIA in the last 168 hours.  Coagulation Profile: No results for input(s): INR, PROTIME in the last 168 hours.  Cardiac Enzymes: Recent Labs  Lab 12/11/18 2022  TROPONINI 0.06*    BNP (last 3 results) No results for input(s): PROBNP in the last 8760 hours.  HbA1C: No results for input(s): HGBA1C in the last 72 hours.  CBG: No results for input(s): GLUCAP in the last 168  hours.  Lipid Profile: No results for input(s): CHOL, HDL, LDLCALC, TRIG, CHOLHDL, LDLDIRECT in the last 72 hours.  Thyroid Function Tests: No results for input(s): TSH, T4TOTAL, FREET4, T3FREE, THYROIDAB in the last 72 hours.  Anemia Panel: No results for input(s): VITAMINB12, FOLATE, FERRITIN, TIBC, IRON, RETICCTPCT in the last 72 hours.  Urine analysis:    Component Value Date/Time   COLORURINE YELLOW 12/12/2018 0232   APPEARANCEUR HAZY (A) 12/12/2018 0232   LABSPEC 1.016 12/12/2018 0232   PHURINE 5.0 12/12/2018 0232   GLUCOSEU NEGATIVE 12/12/2018 0232   HGBUR NEGATIVE 12/12/2018 0232   BILIRUBINUR NEGATIVE 12/12/2018 0232   KETONESUR 5 (A) 12/12/2018 0232   PROTEINUR NEGATIVE 12/12/2018 0232   NITRITE NEGATIVE 12/12/2018 0232   LEUKOCYTESUR LARGE (A) 12/12/2018 0232    Sepsis Labs: Lactic Acid, Venous No results found for: LATICACIDVEN  MICROBIOLOGY: No results found for this or any previous visit (from the past 240 hour(s)).  RADIOLOGY STUDIES/RESULTS: Dg Chest 2 View  Result Date: 12/11/2018 CLINICAL DATA:  Shortness of breath starting this morning EXAM: CHEST - 2 VIEW COMPARISON:  October 30, 2018 FINDINGS: The heart size and mediastinal contours are within normal limits. There is opacity of the right mid lung and right lung base. There is right pleural effusion. The left lung is clear. The visualized skeletal structures are unremarkable. IMPRESSION: Opacity of the right mid lung and right lung base probably due to a combination of right lung base consolidation with pleural effusion. Underlying pneumonia is not excluded. Electronically Signed   By: Abelardo Diesel M.D.   On: 12/11/2018 21:04   Ct Angio Chest Pe W Or Wo Contrast  Addendum Date: 12/12/2018   ADDENDUM REPORT: 12/12/2018 08:09 ADDENDUM: Critical Value/emergent results were called by telephone at the time of interpretation on 12/12/2018 at 8:08 am to Dr. [name], who verbally acknowledged these results.  Electronically Signed   By: Richardean Sale M.D.   On: 12/12/2018 08:09   Result Date: 12/12/2018 CLINICAL DATA:  Shortness of breath. Admitted for pneumonia. High probability for acute pulmonary embolism. EXAM: CT ANGIOGRAPHY CHEST WITH CONTRAST TECHNIQUE: Multidetector CT imaging of the chest was performed using the standard protocol during bolus administration of intravenous contrast. Multiplanar CT image reconstructions and MIPs were obtained to evaluate the vascular anatomy. CONTRAST:  20mL ISOVUE-370 IOPAMIDOL (ISOVUE-370) INJECTION 76% COMPARISON:  Radiographs 12/11/2018 and 10/30/2018. Chest CTA 10/02/2018. FINDINGS: Cardiovascular: The pulmonary arteries are well opacified with contrast to the level of the subsegmental branches. There is fairly extensive acute pulmonary thromboembolic disease bilaterally with involvement of the lobar and distal branches. No specific evidence of right heart strain at this time. There is atherosclerosis of the aorta, great vessels  and coronary arteries. The heart size is normal. There is no pericardial effusion. Mediastinum/Nodes: There are no discretely enlarged mediastinal, hilar or axillary lymph nodes. There is increased soft tissue nodularity in the right pericardial fat. A 3.3 x 2.2 cm right thyroid or parathyroid nodules again noted on image 25/5. There is a moderate size hiatal hernia. Lungs/Pleura: Large right pleural effusion has mildly enlarged compared with the previous CT. Aside from the nodularity in the right pericardial fat described above, no definite pleural based nodularity. There is no significant pleural fluid on the left. There is progressive right lower lobe atelectasis with near complete collapse. There is also mildly progressive right upper and middle lobe atelectasis. Mild left lower lobe atelectasis appears unchanged. Upper abdomen: Left upper quadrant mass or focal fluid measures 3.5 x 3.8 cm on image 131/5. This is located adjacent to the splenic  artery and could involve the pancreatic tail. This is incompletely visualized by this examination. Mild ascites noted. Musculoskeletal/Chest wall: There is no chest wall mass or suspicious osseous finding. Mild scoliosis and spondylosis noted. Review of the MIP images confirms the above findings. IMPRESSION: 1. Study is positive for acute bilateral pulmonary thromboembolic disease. No signs of right heart strain at this time. 2. Enlarging right pleural effusion with worsening right lower lobe and right middle lobe collapse. 3. Increasing nodularity in the right pericardiac fat, suggesting the possibility of a malignant right pleural effusion. If not previously performed, thoracentesis recommended for further evaluation. No other evidence of thoracic malignancy. 4. Indeterminate mass or complex fluid collection in the left upper quadrant of the abdomen, incompletely evaluated by this examination. This may be arising from the pancreatic tail and could reflect pancreatic malignancy. Further evaluation recommended with abdominopelvic CT using pancreatic protocol once the patient has stabilized. Electronically Signed: By: Richardean Sale M.D. On: 12/12/2018 07:57   Vas [place] Lower Extremity Venous (dvt) (only [facility])  Result Date: 12/12/2018  Lower Venous Study Indications: Swelling.  Performing Technologist: Oliver Hum RVT  Examination Guidelines: A complete evaluation includes B-mode imaging, spectral Doppler, color Doppler, and power Doppler as needed of all accessible portions of each vessel. Bilateral testing is considered an integral part of a complete examination. Limited examinations for reoccurring indications may be performed as noted.  Right Venous Findings: +---+---------------+---------+-----------+----------+-------+    CompressibilityPhasicitySpontaneityPropertiesSummary +---+---------------+---------+-----------+----------+-------+ CFVFull           Yes      Yes                           +---+---------------+---------+-----------+----------+-------+  Left Venous Findings: +---------+---------------+---------+-----------+----------+-------+          CompressibilityPhasicitySpontaneityPropertiesSummary +---------+---------------+---------+-----------+----------+-------+ CFV      Partial        No       No                   Acute   +---------+---------------+---------+-----------+----------+-------+ SFJ      Full                                                 +---------+---------------+---------+-----------+----------+-------+ FV Prox  Partial  Acute   +---------+---------------+---------+-----------+----------+-------+ FV Mid   Full                                                 +---------+---------------+---------+-----------+----------+-------+ FV DistalFull                                                 +---------+---------------+---------+-----------+----------+-------+ PFV      Full                                                 +---------+---------------+---------+-----------+----------+-------+ POP      Full           No       No                           +---------+---------------+---------+-----------+----------+-------+ PTV      Full                                                 +---------+---------------+---------+-----------+----------+-------+ PERO     Full                                                 +---------+---------------+---------+-----------+----------+-------+ EIV      Full           No       No                           +---------+---------------+---------+-----------+----------+-------+ Unable to visualize the common iliac vein due to bowel gas.    Summary: Right: No evidence of common femoral vein obstruction. Left: Findings consistent with acute deep vein thrombosis involving the left common femoral vein, and left femoral vein. No cystic structure found  in the popliteal fossa.  *See table(s) above for measurements and observations. Electronically signed by Curt Jews MD on 12/12/2018 at 5:45:09 PM.    Final      LOS: 1 day   Oren Binet, MD  [facility]  If 7PM-7AM, please contact night-coverage  Please page via [email or web address]-Password [email or web address]-click on MD name and type text message  12/13/2018, 11:07 AM

## 2018-12-14 MED ORDER — APIXABAN 5 MG PO TABS: 5.0000 mg | ORAL_TABLET | Freq: Two times a day (BID) | ORAL | 0 refills | Status: AC

## 2018-12-14 MED ORDER — APIXABAN 5 MG PO TABS: 10.0000 mg | ORAL_TABLET | Freq: Two times a day (BID) | ORAL | 0 refills | Status: DC

## 2018-12-14 MED ORDER — AMOXICILLIN-POT CLAVULANATE 875-125 MG PO TABS
1.0000 | ORAL_TABLET | Freq: Two times a day (BID) | ORAL | Status: DC
  Administered 2018-12-14 – 2018-12-16 (×5): 1 via ORAL
  Filled 2018-12-14 (×7): qty 1

## 2018-12-14 MED ORDER — APIXABAN 5 MG PO TABS: 10.0000 mg | ORAL_TABLET | Freq: Two times a day (BID) | ORAL | 0 refills | Status: AC

## 2018-12-14 MED ORDER — LORAZEPAM 0.5 MG PO TABS: 0.5000 mg | ORAL_TABLET | Freq: Three times a day (TID) | ORAL | 0 refills | Status: AC

## 2018-12-14 NOTE — Progress Notes (Signed)
NT took patient's O2 w/o oxygen. O2 saturation,  and it was 93% on RA. Will continue to monitor.   Farley Ly Rn

## 2018-12-14 NOTE — Progress Notes (Signed)
Daily Progress Note   Patient Name: Rebecca Ford       Date: 12/14/2018 DOB: 1929-12-16  Age: 85 y.o. MRN#: [id number] Attending Physician: Jonetta Osgood, MD Primary Care Physician: [name], MD Admit Date: 12/11/2018  Reason for Consultation/Follow-up: Establishing goals of care and Hospice Evaluation  Subjective: No complaints, ready to return to ALF  Length of Stay: 2  Current Medications: Scheduled Meds:  . amoxicillin-clavulanate  1 tablet Oral Q12H  . apixaban  10 mg Oral BID   Followed by  . [START ON 12/20/2018] apixaban  5 mg Oral BID  . diltiazem  30 mg Oral BID  . ferrous gluconate  324 mg Oral Q breakfast  . furosemide  20 mg Oral BH-q7a  . guaiFENesin  600 mg Oral BID  . iopamidol  100 mL Intravenous Once  . loratadine  10 mg Oral Daily  . LORazepam  0.5 mg Oral TID  . metoprolol tartrate  50 mg Oral BID  . pantoprazole  40 mg Oral BID  . polyethylene glycol  17 g Oral Daily    Continuous Infusions: . sodium chloride 500 mL (12/13/18 1948)    PRN Meds: sodium chloride, acetaminophen **OR** acetaminophen, nitroGLYCERIN, ondansetron **OR** ondansetron (ZOFRAN) IV, traZODone  Physical Exam      Constitutional:  General: She is not in acute distress. HENT:  Head: Normocephalicand atraumatic.  Cardiovascular:  Rate and Rhythm: Normal rateand regular rhythm.  Pulmonary:  Effort: Pulmonary effort is normal.  Breath sounds: Normal breath sounds.  Abdominal:  Palpations: Abdomen is soft.  Tenderness: There is no abdominal tenderness.  Musculoskeletal:  Left lower leg: Edemapresent.  Skin: General: Skin is warmand dry.  Neurological:  Mental Status: She is alertand oriented to person, place, and time.  Psychiatric:   Mood and Affect: Moodnormal.  Behavior: Behaviornormal.         Vital Signs: BP (!) 142/66 (BP Location: Left Arm)   Pulse 72   Temp 97.7 F (36.5 C) (Oral)   Resp 18   Ht 5\' 2"  (1.575 m)   Wt 57.8 kg   SpO2 93%   BMI 23.31 kg/m  SpO2: SpO2: 93 % O2 Device: O2 Device: Room Air O2 Flow Rate: O2 Flow Rate (L/min): 2 L/min  Intake/output summary:   Intake/Output Summary (Last 24 hours) at 12/14/2018 1154 Last data filed at 12/14/2018 1151 Gross per 24 hour  Intake 1182.65 ml  Output 1050 ml  Net 132.65 ml   LBM: Last BM Date: 12/13/18 Baseline Weight: Weight: 58.3 kg Most recent weight: Weight: 57.8 kg       Palliative Assessment/Data: PPS 40%    Flowsheet Rows     Most Recent Value  Intake Tab  Referral Department  Hospitalist  Unit at Time of Referral  ER  Palliative Care Primary Diagnosis  Cardiac  Date Notified  12/12/18  Palliative Care Type  New Palliative care  Reason for referral  Clarify Goals of Care  Date of Admission  12/12/18  Date first seen by Palliative Care  12/12/18  # of days IP prior to Palliative referral  0  Clinical Assessment  Palliative Performance Scale Score  40%  Psychosocial & Spiritual Assessment  Palliative Care Outcomes  Patient/Family meeting held?  Yes  Palliative Care Outcomes  Clarified goals of care, Counseled regarding hospice, Provided end of life care assistance, Provided psychosocial or spiritual support, Transitioned to hospice      Patient Active Problem List   Diagnosis Date Noted  . Acute respiratory failure with hypoxia ([place]) 12/12/2018  . CAP (community acquired pneumonia) 12/12/2018  . Acute on chronic diastolic CHF (congestive heart failure) ([place]) 12/12/2018  . Acute pulmonary edema ([place]) 12/12/2018  . Aspiration pneumonia of right lung ([place])   . Goals of care, counseling/discussion   . Palliative care by specialist   . Acute renal failure (ARF) ([place]) 10/25/2018  . Rash and nonspecific skin  eruption 10/25/2018  . Confusion 10/25/2018  . CHF (congestive heart failure) ([place]) 10/02/2018  . Acute CHF (congestive heart failure) ([place]) 10/01/2018  . Macrocytic anemia 10/01/2018  . Chronic depression 12/03/2016  . Localized osteoarthritis of right knee 11/13/2016  . Primary osteoarthritis of right knee 1Jan 17, 202017  . Syncope 11/16/2015  . 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 HYPERTENSION, BENIGN 09/20/2010  . CAD, NATIVE VESSEL 09/20/2010    Palliative Care Assessment & Plan   HPI: 85 y.o.femalewith past medical history of CAD, HTN, HLD, CHF, failure to thrive, anemia, and dementiaadmitted on 1/1/2020with increasing shortness of breath after possible aspiration episode.Apparently patient was having lunch and started gagging - since episode, she has become increasingly short of breath. Spending most of her time in bed. CXR revealed consolidation in the right lung with some pleural effusion. D-dimer 11.24.Left lower extremity venous duplexrevealedthrombosis involving the common femoral, and femoral veins of the left lower extremity. CT angio chest revealedacute bilateral pulmonary thromboembolicdisease. Also, increasing nodularity in the right pericardiac fat, suggesting the possibility of a malignant right pleural effusionand an indeterminate mass or complex fluid collection in the left upper quadrant of the abdomen. Questionable pancreatic malignancy. Patient seen by outpatient palliative [facility] at [facility]. PMT consulted inpatient for [place].  Assessment: Follow up with patient and her daughter. They continue to desire to focus on comfort measures and return to ALF with hospice care. Daughter reports frustration - apparently patient cannot return to ALF over the weekend and will have to wait until Monday. She also tells me of confusion if patient is returning to ALF with hospice or palliative care - we confirmed that I have requested that the patient returns to ALF with hospice -  palliative is unable to provide the amount of support this patient needs.   Emotional support provided. All questions addressed. Daughter has PMT contact information.   Recommendations/Plan:  CSW consult for hospice care at ALF - family requests wheelchair   Continue anticoagulation and complete antibiotics - however, ultimate goal is comfort - no aggressive measures to prolong life  MOST completed - NO hospitalizations, NO IV fluids, NO feeding tube  No further work up for pancreatic mass  Continue scheduled ativan and trazadone  Diet changed from cardiac/fluid restriction to regular per patient/family wishes   Goals of Care and Additional Recommendations:  Limitations on Scope of Treatment: Avoid Hospitalization, Minimize Medications, Initiate Comfort Feeding, No Artificial Feeding, No Chemotherapy and No Radiation  Code Status:  DNR  Prognosis:   < 3 months -  poor prognosis r/t pancreatic mass ?malignancy, DVT with PE, aspiration, weight loss, poor appetite, severe functional decline, cognitive decline  Discharge Planning:  ALF with hospice services  Care plan was discussed with patient and daughter  Thank you for allowing the Palliative  Medicine Team to assist in the care of this patient.   Total Time 25 minutes Prolonged Time Billed  no       Greater than 50%  of this time was spent counseling and coordinating care related to the above assessment and plan.  Juel Burrow, DNP, [facility] Palliative Medicine Team Team Phone # [phone]  Pager [phone]

## 2018-12-14 NOTE — Progress Notes (Signed)
Patient was feeling SOB, and wanted her O2 checked on RA. Oxygen saturation was 93% on RA. Family member wanted her [place] on at this time. Patient is now on 1 liter of O2 [place]. MD made aware. Patient is comfortable at this time.   Farley Ly RN

## 2018-12-14 NOTE — Discharge Summary (Addendum)
PATIENT DETAILS Name: Rebecca Ford Age: 85 y.o. Sex: female Date of Birth: [birth year] MRN: [id number]. Admitting Physician: [name], MD DTO:IZTIW, Herbie [place], MD  Admit Date: 12/11/2018 Discharge date: 12/16/2018  Recommendations for Outpatient Follow-up:  1. Follow up with PCP in 1-2 weeks 2. Palliative care follow-up at ALF  Admitted From:  ALF   Disposition: ALF    Home Health: No  Equipment/Devices: None  Discharge Condition: Stable  CODE STATUS: FULL CODE  Diet recommendation:   Regular  Brief Summary: See H&P, Labs, Consult and Test reports for all details in brief,Patient is a 85 y.o. female with heart failure, M[place], failure to thrive syndrome with significant decline in her functional status over the past few months (now mostly bed to wheelchair bound) scented to the hospital with swelling of her left lower extremity and shortness of breath.  Further imaging demonstrated left lower extremity DVT and bilateral pulmonary embolism.  See below for further details  Brief Hospital Course: Acute hypoxic respiratory failure: Likely secondary to pulmonary embolism and aspiration pneumonia.  Per RN-titrated off oxygen this morning-now on room air.  Continue Eliquis and Augmentin on discharge.   Pulmonary embolism with left lower extremity DVT: Likely provoked-as patient is mostly sedentary-however patient appears to have a pancreatic mass/malignancy which may be contributing.  IV heparin-no clinical features of RV strain, echo with normal RV function.  She was subsequently transitioned to Eliquis.  Swelling in the left lower extremity has markedly improved as well.  Continue Eliquis on discharge.  Probable aspiration pneumonia: Patient/family does give a history of choking episode approximately 2 days prior to this hospitalization-apparently patient has had issues with reflux/aspiration in the past.    Managed with IV Unasyn in the hospital along with  Mucinex/incentive spirometry/flutter valve.  Speech therapy evaluation completed-continue regular diet.  Will be transitioned to Augmentin on discharge.  Right-sided pleural effusion: Multiple differentials-including parapneumonic effusion, possible malignant effusion (pancreatic mass), diastolic heart failure and from pulmonary embolism.  Although appears significant on imaging studies-she appears to be asymptomatic.  Both patient/family do not want aggressive care including procedures-hence will monitor and observe.  Already on antibiotics and diuretics.  Chronic diastolic heart failure: Volume status is stable-continue oral Lasix.  Pancreatic mass: Seen on CT scan of chest-both patient and family are very clear-they do not want aggressive care including chemotherapy.  Given large VTE burden-would defer any biopsy etc. at this time-as patient probably requires uninterrupted anticoagulation for few weeks/months-furthermore since patient does not want treatment even if this turns out to be cancer-do not think obtaining a biopsy is going to change management.  Both patient/family agree-defer any plans for biopsy at this point.   Hypertension: Controlled-continue Cardizem and metoprolol.  Failure to thrive syndrome: Although she has put on a few pounds recently-overall she has lost weight-has become more frail and deconditioned over the past few months-she is now mostly bed to wheelchair bound.  DNR in place-both patient and daughter are very clear-they would only want to pursue gentle medical treatment.  See above regarding deferring thoracocentesis and pancreatic biopsy.  At the request of family, palliative care evaluation completed-patient prefers no rehospitalization, gentle medical treatment-and will request hospice/palliative care to follow patient at ALF.   Procedures/Studies: None  Discharge Diagnoses:  Principal Problem:   Acute respiratory failure with hypoxia (HCC) Active  Problems:   HYPERTENSION, BENIGN   Macrocytic anemia   CAP (community acquired pneumonia)   Acute on chronic diastolic CHF (congestive heart failure) ([place])  Acute pulmonary edema (HCC)   Goals of care, counseling/discussion   Palliative care by specialist   Discharge Instructions:  Activity:  As tolerated with Full fall precautions use walker/cane & assistance as needed   Discharge Instructions    Diet general   Complete by:  As directed    Discharge instructions   Complete by:  As directed    Follow with Primary MD  [name], MD in 1 week  Please get a complete blood count and chemistry panel checked by your Primary MD at your next visit, and again as instructed by your Primary MD.  Get Medicines reviewed and adjusted: Please take all your medications with you for your next visit with your Primary MD  Laboratory/radiological data: Please request your Primary MD to go over all hospital tests and procedure/radiological results at the follow up, please ask your Primary MD to get all Hospital records sent to his/her office.  In some cases, they will be blood work, cultures and biopsy results pending at the time of your discharge. Please request that your primary care M.D. follows up on these results.  Also Note the following: If you experience worsening of your admission symptoms, develop shortness of breath, life threatening emergency, suicidal or homicidal thoughts you must seek medical attention immediately by calling 911 or calling your MD immediately  if symptoms less severe.  You must read complete instructions/literature along with all the possible adverse reactions/side effects for all the Medicines you take and that have been prescribed to you. Take any new Medicines after you have completely understood and accpet all the possible adverse reactions/side effects.   Do not drive when taking Pain medications or sleeping medications (Benzodaizepines)  Do not take more  than prescribed Pain, Sleep and Anxiety Medications. It is not advisable to combine anxiety,sleep and pain medications without talking with your primary care practitioner  Special Instructions: If you have smoked or chewed Tobacco  in the last 2 yrs please stop smoking, stop any regular Alcohol  and or any Recreational drug use.  Wear Seat belts while driving.  Please note: You were cared for by a hospitalist during your hospital stay. Once you are discharged, your primary care physician will handle any further medical issues. Please note that NO REFILLS for any discharge medications will be authorized once you are discharged, as it is imperative that you return to your primary care physician (or establish a relationship with a primary care physician if you do not have one) for your post hospital discharge needs so that they can reassess your need for medications and monitor your lab values.   Increase activity slowly   Complete by:  As directed      Allergies as of 12/16/2018      Reactions   Codeine Other (See Comments)   Makes her severely depressed       Medication List    STOP taking these medications   aspirin EC 81 MG tablet     TAKE these medications   acetaminophen 325 MG tablet Commonly known as:  TYLENOL Take 650 mg by mouth every 6 (six) hours as needed for mild pain.   apixaban 5 MG Tabs tablet Commonly known as:  ELIQUIS Take 2 tablets (10 mg total) by mouth 2 (two) times daily for 5 days. Stop on 1/9, and then transition to 5 mg twice daily on 1/10   apixaban 5 MG Tabs tablet Commonly known as:  ELIQUIS Take 1 tablet (5 mg total)  by mouth 2 (two) times daily. Start taking on:  December 20, 2018   cetirizine 10 MG tablet Commonly known as:  ZYRTEC Take 10 mg by mouth daily.   diltiazem 30 MG tablet Commonly known as:  CARDIZEM Take 1 tablet (30 mg total) by mouth every 12 (twelve) hours. What changed:  when to take this   furosemide 20 MG tablet Commonly  known as:  LASIX Take 20 mg by mouth every morning.   IRON 27 PO Take 1 tablet by mouth daily.   LORazepam 0.5 MG tablet Commonly known as:  ATIVAN Take 1 tablet (0.5 mg total) by mouth 3 (three) times daily.   metoprolol tartrate 50 MG tablet Commonly known as:  LOPRESSOR Take 50 mg by mouth 2 (two) times daily.   nitroGLYCERIN 0.4 MG SL tablet Commonly known as:  NITROSTAT Place 1 tablet (0.4 mg total) under the tongue every 5 (five) minutes as needed for chest pain.   pantoprazole 40 MG tablet Commonly known as:  PROTONIX Take 1 tablet (40 mg total) by mouth 2 (two) times daily.   polyethylene glycol packet Commonly known as:  MIRALAX / GLYCOLAX Take 17 g by mouth daily.   PRESERVISION AREDS PO Take 1 tablet by mouth 2 (two) times daily.   traZODone 50 MG tablet Commonly known as:  DESYREL Take 50 mg by mouth at bedtime as needed for sleep.      Follow-up Information    [name], MD. Schedule an appointment as soon as possible for a visit in 1 week(s).   Specialty:  Family Medicine Contact information: [place] [address] [place] Follow up.   Specialty:  Hospice and Palliative Medicine Contact information: [place] [address] [phone]          Allergies  Allergen Reactions  . Codeine Other (See Comments)    Makes her severely depressed     Consultations:   Palliative care  Other Procedures/Studies: Dg Chest 2 View  Result Date: 12/11/2018 CLINICAL DATA:  Shortness of breath starting this morning EXAM: CHEST - 2 VIEW COMPARISON:  October 30, 2018 FINDINGS: The heart size and mediastinal contours are within normal limits. There is opacity of the right mid lung and right lung base. There is right pleural effusion. The left lung is clear. The visualized skeletal structures are unremarkable. IMPRESSION: Opacity of the right mid lung and right lung base  probably due to a combination of right lung base consolidation with pleural effusion. Underlying pneumonia is not excluded. Electronically Signed   By: Abelardo Diesel M.D.   On: 12/11/2018 21:04   Ct Angio Chest Pe W Or Wo Contrast  Addendum Date: 12/12/2018   ADDENDUM REPORT: 12/12/2018 08:09 ADDENDUM: Critical Value/emergent results were called by telephone at the time of interpretation on 12/12/2018 at 8:08 am to Dr. [name], who verbally acknowledged these results. Electronically Signed   By: Richardean Sale M.D.   On: 12/12/2018 08:09   Result Date: 12/12/2018 CLINICAL DATA:  Shortness of breath. Admitted for pneumonia. High probability for acute pulmonary embolism. EXAM: CT ANGIOGRAPHY CHEST WITH CONTRAST TECHNIQUE: Multidetector CT imaging of the chest was performed using the standard protocol during bolus administration of intravenous contrast. Multiplanar CT image reconstructions and MIPs were obtained to evaluate the vascular anatomy. CONTRAST:  42mL ISOVUE-370 IOPAMIDOL (ISOVUE-370) INJECTION 76% COMPARISON:  Radiographs 12/11/2018 and 10/30/2018. Chest CTA 10/02/2018. FINDINGS: Cardiovascular: The  pulmonary arteries are well opacified with contrast to the level of the subsegmental branches. There is fairly extensive acute pulmonary thromboembolic disease bilaterally with involvement of the lobar and distal branches. No specific evidence of right heart strain at this time. There is atherosclerosis of the aorta, great vessels and coronary arteries. The heart size is normal. There is no pericardial effusion. Mediastinum/Nodes: There are no discretely enlarged mediastinal, hilar or axillary lymph nodes. There is increased soft tissue nodularity in the right pericardial fat. A 3.3 x 2.2 cm right thyroid or parathyroid nodules again noted on image 25/5. There is a moderate size hiatal hernia. Lungs/Pleura: Large right pleural effusion has mildly enlarged compared with the previous CT. Aside from the  nodularity in the right pericardial fat described above, no definite pleural based nodularity. There is no significant pleural fluid on the left. There is progressive right lower lobe atelectasis with near complete collapse. There is also mildly progressive right upper and middle lobe atelectasis. Mild left lower lobe atelectasis appears unchanged. Upper abdomen: Left upper quadrant mass or focal fluid measures 3.5 x 3.8 cm on image 131/5. This is located adjacent to the splenic artery and could involve the pancreatic tail. This is incompletely visualized by this examination. Mild ascites noted. Musculoskeletal/Chest wall: There is no chest wall mass or suspicious osseous finding. Mild scoliosis and spondylosis noted. Review of the MIP images confirms the above findings. IMPRESSION: 1. Study is positive for acute bilateral pulmonary thromboembolic disease. No signs of right heart strain at this time. 2. Enlarging right pleural effusion with worsening right lower lobe and right middle lobe collapse. 3. Increasing nodularity in the right pericardiac fat, suggesting the possibility of a malignant right pleural effusion. If not previously performed, thoracentesis recommended for further evaluation. No other evidence of thoracic malignancy. 4. Indeterminate mass or complex fluid collection in the left upper quadrant of the abdomen, incompletely evaluated by this examination. This may be arising from the pancreatic tail and could reflect pancreatic malignancy. Further evaluation recommended with abdominopelvic CT using pancreatic protocol once the patient has stabilized. Electronically Signed: By: Richardean Sale M.D. On: 12/12/2018 07:57   Vas [place] Lower Extremity Venous (dvt) (only [facility])  Result Date: 12/12/2018  Lower Venous Study Indications: Swelling.  Performing Technologist: Oliver Hum RVT  Examination Guidelines: A complete evaluation includes B-mode imaging, spectral Doppler, color Doppler, and power  Doppler as needed of all accessible portions of each vessel. Bilateral testing is considered an integral part of a complete examination. Limited examinations for reoccurring indications may be performed as noted.  Right Venous Findings: +---+---------------+---------+-----------+----------+-------+    CompressibilityPhasicitySpontaneityPropertiesSummary +---+---------------+---------+-----------+----------+-------+ CFVFull           Yes      Yes                          +---+---------------+---------+-----------+----------+-------+  Left Venous Findings: +---------+---------------+---------+-----------+----------+-------+          CompressibilityPhasicitySpontaneityPropertiesSummary +---------+---------------+---------+-----------+----------+-------+ CFV      Partial        No       No                   Acute   +---------+---------------+---------+-----------+----------+-------+ SFJ      Full                                                 +---------+---------------+---------+-----------+----------+-------+  FV Prox  Partial                                      Acute   +---------+---------------+---------+-----------+----------+-------+ FV Mid   Full                                                 +---------+---------------+---------+-----------+----------+-------+ FV DistalFull                                                 +---------+---------------+---------+-----------+----------+-------+ PFV      Full                                                 +---------+---------------+---------+-----------+----------+-------+ POP      Full           No       No                           +---------+---------------+---------+-----------+----------+-------+ PTV      Full                                                 +---------+---------------+---------+-----------+----------+-------+ PERO     Full                                                  +---------+---------------+---------+-----------+----------+-------+ EIV      Full           No       No                           +---------+---------------+---------+-----------+----------+-------+ Unable to visualize the common iliac vein due to bowel gas.    Summary: Right: No evidence of common femoral vein obstruction. Left: Findings consistent with acute deep vein thrombosis involving the left common femoral vein, and left femoral vein. No cystic structure found in the popliteal fossa.  *See table(s) above for measurements and observations. Electronically signed by Curt Jews MD on 12/12/2018 at 5:45:09 PM.    Final      TODAY-DAY OF DISCHARGE:  Subjective:   Rebecca Ford today has no headache,no chest abdominal pain,no new weakness tingling or numbness, feels much better wants to go home today.   Objective:   Blood pressure 139/65, pulse 87, temperature 98.4 F (36.9 C), temperature source Oral, resp. rate 18, height 5\' 2"  (1.575 m), weight 61.5 kg, SpO2 94 %.  Intake/Output Summary (Last 24 hours) at 12/16/2018 1251 Last data filed at 12/16/2018 0942 Gross per 24 hour  Intake 1020 ml  Output 500 ml  Net 520 ml   Filed Weights   12/12/18 2116 12/13/18  2056 12/16/18 0342  Weight: 57.8 kg 57.8 kg 61.5 kg    Exam: Awake Alert, Oriented *3, No new F.N deficits, Normal affect [place].AT,PERRAL Supple Neck,No JVD, No cervical lymphadenopathy appriciated.  Symmetrical Chest wall movement, Good air movement bilaterally, CTAB RRR,No Gallops,Rubs or new Murmurs, No Parasternal Heave +ve B.Sounds, Abd Soft, Non tender, No organomegaly appriciated, No rebound -guarding or rigidity. No Cyanosis, Clubbing or edema, No new Rash or bruise   PERTINENT RADIOLOGIC STUDIES: Dg Chest 2 View  Result Date: 12/11/2018 CLINICAL DATA:  Shortness of breath starting this morning EXAM: CHEST - 2 VIEW COMPARISON:  October 30, 2018 FINDINGS: The heart size and mediastinal contours are within normal  limits. There is opacity of the right mid lung and right lung base. There is right pleural effusion. The left lung is clear. The visualized skeletal structures are unremarkable. IMPRESSION: Opacity of the right mid lung and right lung base probably due to a combination of right lung base consolidation with pleural effusion. Underlying pneumonia is not excluded. Electronically Signed   By: Abelardo Diesel M.D.   On: 12/11/2018 21:04   Ct Angio Chest Pe W Or Wo Contrast  Addendum Date: 12/12/2018   ADDENDUM REPORT: 12/12/2018 08:09 ADDENDUM: Critical Value/emergent results were called by telephone at the time of interpretation on 12/12/2018 at 8:08 am to Dr. [name], who verbally acknowledged these results. Electronically Signed   By: Richardean Sale M.D.   On: 12/12/2018 08:09   Result Date: 12/12/2018 CLINICAL DATA:  Shortness of breath. Admitted for pneumonia. High probability for acute pulmonary embolism. EXAM: CT ANGIOGRAPHY CHEST WITH CONTRAST TECHNIQUE: Multidetector CT imaging of the chest was performed using the standard protocol during bolus administration of intravenous contrast. Multiplanar CT image reconstructions and MIPs were obtained to evaluate the vascular anatomy. CONTRAST:  87mL ISOVUE-370 IOPAMIDOL (ISOVUE-370) INJECTION 76% COMPARISON:  Radiographs 12/11/2018 and 10/30/2018. Chest CTA 10/02/2018. FINDINGS: Cardiovascular: The pulmonary arteries are well opacified with contrast to the level of the subsegmental branches. There is fairly extensive acute pulmonary thromboembolic disease bilaterally with involvement of the lobar and distal branches. No specific evidence of right heart strain at this time. There is atherosclerosis of the aorta, great vessels and coronary arteries. The heart size is normal. There is no pericardial effusion. Mediastinum/Nodes: There are no discretely enlarged mediastinal, hilar or axillary lymph nodes. There is increased soft tissue nodularity in the right pericardial  fat. A 3.3 x 2.2 cm right thyroid or parathyroid nodules again noted on image 25/5. There is a moderate size hiatal hernia. Lungs/Pleura: Large right pleural effusion has mildly enlarged compared with the previous CT. Aside from the nodularity in the right pericardial fat described above, no definite pleural based nodularity. There is no significant pleural fluid on the left. There is progressive right lower lobe atelectasis with near complete collapse. There is also mildly progressive right upper and middle lobe atelectasis. Mild left lower lobe atelectasis appears unchanged. Upper abdomen: Left upper quadrant mass or focal fluid measures 3.5 x 3.8 cm on image 131/5. This is located adjacent to the splenic artery and could involve the pancreatic tail. This is incompletely visualized by this examination. Mild ascites noted. Musculoskeletal/Chest wall: There is no chest wall mass or suspicious osseous finding. Mild scoliosis and spondylosis noted. Review of the MIP images confirms the above findings. IMPRESSION: 1. Study is positive for acute bilateral pulmonary thromboembolic disease. No signs of right heart strain at this time. 2. Enlarging right pleural effusion with worsening right lower lobe  and right middle lobe collapse. 3. Increasing nodularity in the right pericardiac fat, suggesting the possibility of a malignant right pleural effusion. If not previously performed, thoracentesis recommended for further evaluation. No other evidence of thoracic malignancy. 4. Indeterminate mass or complex fluid collection in the left upper quadrant of the abdomen, incompletely evaluated by this examination. This may be arising from the pancreatic tail and could reflect pancreatic malignancy. Further evaluation recommended with abdominopelvic CT using pancreatic protocol once the patient has stabilized. Electronically Signed: By: Richardean Sale M.D. On: 12/12/2018 07:57   Vas [place] Lower Extremity Venous (dvt) (only [facility])  Result Date: 12/12/2018  Lower Venous Study Indications: Swelling.  Performing Technologist: Oliver Hum RVT  Examination Guidelines: A complete evaluation includes B-mode imaging, spectral Doppler, color Doppler, and power Doppler as needed of all accessible portions of each vessel. Bilateral testing is considered an integral part of a complete examination. Limited examinations for reoccurring indications may be performed as noted.  Right Venous Findings: +---+---------------+---------+-----------+----------+-------+    CompressibilityPhasicitySpontaneityPropertiesSummary +---+---------------+---------+-----------+----------+-------+ CFVFull           Yes      Yes                          +---+---------------+---------+-----------+----------+-------+  Left Venous Findings: +---------+---------------+---------+-----------+----------+-------+          CompressibilityPhasicitySpontaneityPropertiesSummary +---------+---------------+---------+-----------+----------+-------+ CFV      Partial        No       No                   Acute   +---------+---------------+---------+-----------+----------+-------+ SFJ      Full                                                 +---------+---------------+---------+-----------+----------+-------+ FV Prox  Partial                                      Acute   +---------+---------------+---------+-----------+----------+-------+ FV Mid   Full                                                 +---------+---------------+---------+-----------+----------+-------+ FV DistalFull                                                 +---------+---------------+---------+-----------+----------+-------+ PFV      Full                                                 +---------+---------------+---------+-----------+----------+-------+ POP      Full           No       No                            +---------+---------------+---------+-----------+----------+-------+ PTV  Full                                                 +---------+---------------+---------+-----------+----------+-------+ PERO     Full                                                 +---------+---------------+---------+-----------+----------+-------+ EIV      Full           No       No                           +---------+---------------+---------+-----------+----------+-------+ Unable to visualize the common iliac vein due to bowel gas.    Summary: Right: No evidence of common femoral vein obstruction. Left: Findings consistent with acute deep vein thrombosis involving the left common femoral vein, and left femoral vein. No cystic structure found in the popliteal fossa.  *See table(s) above for measurements and observations. Electronically signed by Curt Jews MD on 12/12/2018 at 5:45:09 PM.    Final      PERTINENT LAB RESULTS: CBC: No results for input(s): WBC, HGB, HCT, PLT in the last 72 hours. CMET CMP     Component Value Date/Time   NA 135 12/12/2018 0404   NA 135 (A) 11/21/2016   K 3.5 12/12/2018 0404   CL 103 12/12/2018 0404   CO2 22 12/12/2018 0404   GLUCOSE 262 (H) 12/12/2018 0404   BUN 18 12/12/2018 0404   BUN 14 11/21/2016   CREATININE 0.98 12/12/2018 0404   CALCIUM 8.6 (L) 12/12/2018 0404   PROT 6.5 12/11/2018 2022   ALBUMIN 2.5 (L) 12/11/2018 2022   AST 28 12/11/2018 2022   ALT 8 12/11/2018 2022   ALKPHOS 153 (H) 12/11/2018 2022   BILITOT 0.6 12/11/2018 2022   GFRNONAA 52 (L) 12/12/2018 0404   GFRAA >60 12/12/2018 0404    GFR Estimated Creatinine Clearance: 35.6 mL/min (by C-G formula based on SCr of 0.98 mg/dL). No results for input(s): LIPASE, AMYLASE in the last 72 hours. No results for input(s): CKTOTAL, CKMB, CKMBINDEX, TROPONINI in the last 72 hours. Invalid input(s): POCBNP No results for input(s): DDIMER in the last 72 hours. No results for input(s): HGBA1C in  the last 72 hours. No results for input(s): CHOL, HDL, LDLCALC, TRIG, CHOLHDL, LDLDIRECT in the last 72 hours. No results for input(s): TSH, T4TOTAL, T3FREE, THYROIDAB in the last 72 hours.  Invalid input(s): FREET3 No results for input(s): VITAMINB12, FOLATE, FERRITIN, TIBC, IRON, RETICCTPCT in the last 72 hours. Coags: No results for input(s): INR in the last 72 hours.  Invalid input(s): PT Microbiology: Recent Results (from the past 240 hour(s))  MRSA PCR Screening     Status: None   Collection Time: 12/14/18 10:57 PM  Result Value Ref Range Status   MRSA by PCR NEGATIVE NEGATIVE Final    Comment:        The GeneXpert MRSA Assay (FDA approved for NASAL specimens only), is one component of a comprehensive MRSA colonization surveillance program. It is not intended to diagnose MRSA infection nor to guide or monitor treatment for MRSA infections. Performed at [facility], [place] [address] [place], [place] [address]  FURTHER DISCHARGE INSTRUCTIONS:  Get Medicines reviewed and adjusted: Please take all your medications with you for your next visit with your Primary MD  Laboratory/radiological data: Please request your Primary MD to go over all hospital tests and procedure/radiological results at the follow up, please ask your Primary MD to get all Hospital records sent to his/her office.  In some cases, they will be blood work, cultures and biopsy results pending at the time of your discharge. Please request that your primary care M.D. goes through all the records of your hospital data and follows up on these results.  Also Note the following: If you experience worsening of your admission symptoms, develop shortness of breath, life threatening emergency, suicidal or homicidal thoughts you must seek medical attention immediately by calling 911 or calling your MD immediately  if symptoms less severe.  You must read complete instructions/literature along with all the  possible adverse reactions/side effects for all the Medicines you take and that have been prescribed to you. Take any new Medicines after you have completely understood and accpet all the possible adverse reactions/side effects.   Do not drive when taking Pain medications or sleeping medications (Benzodaizepines)  Do not take more than prescribed Pain, Sleep and Anxiety Medications. It is not advisable to combine anxiety,sleep and pain medications without talking with your primary care practitioner  Special Instructions: If you have smoked or chewed Tobacco  in the last 2 yrs please stop smoking, stop any regular Alcohol  and or any Recreational drug use.  Wear Seat belts while driving.  Please note: You were cared for by a hospitalist during your hospital stay. Once you are discharged, your primary care physician will handle any further medical issues. Please note that NO REFILLS for any discharge medications will be authorized once you are discharged, as it is imperative that you return to your primary care physician (or establish a relationship with a primary care physician if you do not have one) for your post hospital discharge needs so that they can reassess your need for medications and monitor your lab values.  Total Time spent coordinating discharge including counseling, education and face to face time equals 35 minutes.  Signed: [name] 12/16/2018 12:51 PM

## 2018-12-14 NOTE — Care Management Note (Addendum)
Case Management Note  Patient Details  Name: Rebecca Ford MRN: [id number] Date of Birth: 1930/03/01  Subjective/Objective: 85 yo F with SOB and bil leg swelling. She was recently diagnosed with diastolic CHF. Last EF in October 2019 was 60 to 65% with grade 1 diastolic dysfunction. Hx of dementia. Pt has become bedbound. Uses wheelchair for ambulation.      Action/Plan: Referral received to assit with hospice   Expected Discharge Date:  12/14/18               Expected Discharge Plan:  Assisted Living / Rest Home  In-House Referral:     Discharge planning Services  CM Consult  Post Acute Care Choice:    Choice offered to:     DME Arranged:    DME Agency:     HH Arranged:    [place]:  [facility]  Status of Service:  In process, will continue to follow  If discussed at Long Length of Stay Meetings, dates discussed:    Additional Comments: Met with pt at bedside. She provided her daughter's phone numbers - Rebecca Ford [phone] (cell) and [phone] (home). Contacted Rebecca Ford to discuss hospice. Rebecca Ford reports that the D/C plan is for pt to return to [place] with hospice. Discussed preference for a hospice agency. She [facility]. Contacted Lisa at [facility] for referral.  Discussed D/C plan with [place], SW. She reports that she spoke to Brea at [facility] and they are not able to receive pt today due to staff issues. Paged Dr. Blanchie Dessert twice and awaiting for him to return call. RN made aware that pt can't be D/C today and I'm waiting for Dr. Blanchie Dessert to return my call.  Will continue to f/u to assist with the D/C plan.  Norina Buzzard, RN 12/14/2018, 12:19 PM

## 2018-12-14 NOTE — Progress Notes (Signed)
CSW spoke with [name] [phone]) at [facility], charge nurse, she stated that they are unable to take patients over the weekend due to minimal staffing.   CSW let the case manager know.   Domenic Schwab, MSW, [place]

## 2018-12-14 NOTE — Progress Notes (Signed)
ANTICOAGULATION CONSULT NOTE - [place] for heparin>apixaban Indication: PE/ DVT  Allergies  Allergen Reactions  . Codeine Other (See Comments)    Makes her severely depressed     Patient Measurements: Height: 5\' 2"  (157.5 cm) Weight: 127 lb 6.8 oz (57.8 kg) IBW/kg (Calculated) : 50.1  Vital Signs: Temp: 97.7 F (36.5 C) (01/04 0843) Temp Source: Oral (01/04 0843) BP: 142/66 (01/04 0843) Pulse Rate: 96 (01/04 0843)  Labs: Recent Labs    12/11/18 2022 12/12/18 0404 12/12/18 1624 12/13/18 1059  HGB 11.8* 11.3*  --  11.5*  HCT 37.7 35.8*  --  35.8*  PLT 302 259  --  291  HEPARINUNFRC  --   --  1.04* 0.83*  CREATININE 0.92 0.98  --   --   TROPONINI 0.06*  --   --   --     Estimated Creatinine Clearance: 32.6 mL/min (by C-G formula based on SCr of 0.98 mg/dL).   Medical History: Past Medical History:  Diagnosis Date  . Arthritis   . CAD (coronary artery disease)    s/p cath 09/07/10 w/3 vessel CAD, DES in RCA, Diagonal & LAD  . Dementia ([place])   . HTN (hypertension)   . Hyperlipemia   . MI (myocardial infarction) ([place])    Non-ST elevation 9/11  . Primary osteoarthritis of right knee 09/2016    Assessment: 42 YOF with bilateral PE on no AC PTA.  H/H low but stable, plts 291. SCr 0.98, CrCl ~ 43mL/min  Goal of Therapy:  Therapeutic anticoagulation   Plan:  Continue Apixaban 10mg  BID x 7 days, then 5mg  daily thereafter Since apixaban dose will not change, pharmacy will sign-off at this time.   Jackson Latino, PharmD PGY1 Pharmacy Resident Phone [phone] 12/14/2018     10:08 AM

## 2018-12-14 NOTE — Progress Notes (Signed)
CSW spoke RNCM about the patient returning to her ALF with hospice care. RNCM will be helping coordinate a return with hospice.   CSW signing off.   Domenic Schwab, MSW, [place]

## 2018-12-15 LAB — MRSA PCR SCREENING: MRSA by PCR: NEGATIVE

## 2018-12-15 NOTE — Progress Notes (Signed)
Hospice and [facility]) Hospital Liaison:  Received request from [facility] on 12/14/18 for patient/family request for [facility] services at [facility] after discharge. Chart and patient information reviewed by [facility] physician and eligibility has been confirmed.  [place] spoke with daughter Abigail Butts 12/14/18 to confirm [place] followed up with Abigail Butts 12/15/18 to explain services, answer questions and discuss medications. Abigail Butts verbalized understanding of information given. Per discussion, [facility] can accept patient back 12/16/18. Patient to transport via [place].  Please send signed and completed out of facility DNR form home with patient.   Patient will need prescriptions for discharge comfort medications.  DME discussed with Abigail Butts. At her request, oxygen setup has been ordered through [place].  Delivery address is [facility], [place], [place], [place], [place] [address]. Abigail Butts is contact to arrange time of delivery. [facility] aware of the above and will coordinate first visit. Abigail Butts is aware she will be contacted and will need to be present for visit.    Completed discharge summary will need to be faxed to [facility] at [phone] when final.  Please notify [facility] when patient is ready to leave the unit at discharge. (Call [phone] between 8:30 and 5pm. Call [phone] after 5pm.)  [facility] information and contact numbers given to [place]. Above information shared with [place].   Please call with hospice related questions. Thank you for this referral.  Erling Conte, [place] Hospital Liaison [place] are listed on AMION.

## 2018-12-15 NOTE — Care Management (Addendum)
Pt will not be able to return to [facility] until tomorrow due to limited staffing on the weekends and patient's new need for hospice per CSW.  [facility] nurse, Lattie Haw, has spoken with daughter, Abigail Butts.  [facility] will plan a visit to admit patient when she has returned to [facility].  Guy Franco that patient and family requested O2 at 1 lpm yesterday afternoon and they should arrange to have O2 available to patient upon return to [facility].   Dr. Alyson Ingles will remain patient's PCP until April although he is out of network with patient's insurance.  Pt will then transition to an [place] provider in her network. The hospice doctor, Dr. Eulas Post, will provide for patient's hospice needs in conjunction with Dr. Alyson Ingles.

## 2018-12-15 NOTE — Progress Notes (Signed)
[facility]                                                                                                                                                                         Patient Demographics  Rebecca Ford, is a 85 y.o. female  IEP:[id number]  YSA:[id number]  DOB - 1929-12-16  Admit date - 12/11/2018  Admitting Physician [name], MD  Outpatient Primary MD for the patient is Rebecca Dus, MD  LOS - 3   Chief Complaint  Patient presents with  . Shortness of Breath        Assessment & Plan    Patient seen briefly today due for discharge soon per Discharge done yesterday by Dr [name], no further issues, Vital signs stable, patient feels fine.      Medications  Scheduled Meds: . amoxicillin-clavulanate  1 tablet Oral Q12H  . apixaban  10 mg Oral BID   Followed by  . [START ON 12/20/2018] apixaban  5 mg Oral BID  . diltiazem  30 mg Oral BID  . ferrous gluconate  324 mg Oral Q breakfast  . furosemide  20 mg Oral BH-q7a  . guaiFENesin  600 mg Oral BID  . iopamidol  100 mL Intravenous Once  . loratadine  10 mg Oral Daily  . LORazepam  0.5 mg Oral TID  . metoprolol tartrate  50 mg Oral BID  . pantoprazole  40 mg Oral BID  . polyethylene glycol  17 g Oral Daily   Continuous Infusions: . sodium chloride 500 mL (12/13/18 1948)   PRN Meds:.sodium chloride, acetaminophen **OR** acetaminophen, nitroGLYCERIN, ondansetron **OR** ondansetron (ZOFRAN) IV, traZODone    Time Spent in minutes   10 minutes   [name] M.D on 12/15/2018 at 11:40 AM  Between 7am to 7pm - Pager - [phone]  After 7pm go to [email or web address] - password [email or web address]  And look for the night coverage person covering for me after hours  [facility] Office  [phone]    Subjective:   Pattiann Ford today has, No headache, No chest pain, No abdominal pain - No Nausea, No new weakness tingling  or numbness, No Cough - SOB.    Objective:   Vitals:   12/14/18 1031 12/14/18 1725 12/14/18 2033 12/15/18 0431  BP:  132/62 139/68 (!) 130/40  Pulse:  94 (!) 105 92  Resp:  16 18 20   Temp:  97.7 F (36.5 C) 99 F (37.2 C) 98.5 F (36.9 C)  TempSrc:  Oral Oral Oral  SpO2: 93% 95% 96% 95%  Weight:      Height:        Wt Readings from Last 3 Encounters:  12/13/18 57.8 kg  10/31/18 60.4 kg  10/09/18 61.4 kg     Intake/Output Summary (Last 24 hours) at 12/15/2018 1140 Last data filed at 12/15/2018 1034 Gross per 24 hour  Intake 670 ml  Output 700 ml  Net -30 ml    Exam Awake Alert, Oriented X 3, No new F.N deficits, Normal affect [place].AT,PERRAL Supple Neck,No JVD, No cervical lymphadenopathy appriciated.  Symmetrical Chest wall movement, Good air movement bilaterally, CTAB RRR,No Gallops,Rubs or new Murmurs, No Parasternal Heave +ve B.Sounds, Abd Soft, Non tender, No organomegaly appriciated, No rebound - guarding or rigidity. No Cyanosis, Clubbing or edema, No new Rash or bruise      Data Review

## 2018-12-16 NOTE — Clinical Social Work Note (Signed)
Patient medically stable for discharge and will return to [facility], where she resides. Facility staff person [place] contacted and discharge clinicals transmitted to facility. Facility also aware that patient is returning with Hospice services. CSW signing off as no other SW intervention services needed.  Madalynn Pickelsimer Givens, MSW, LCSW Licensed Clinical Social Worker [place] [phone]

## 2018-12-16 NOTE — Progress Notes (Signed)
SLP Cancellation Note  Patient Details Name: SHARECE FLEISCHHACKER MRN: [id number] DOB: 13-Mar-1930   Cancelled treatment:       Reason Eval/Treat Not Completed: Other (comment) Pt and family have met with palliative care with [facility] now focusing on comfort. In light of this, diet has already been liberalized per family request. SLP to sign off. Please reorder if we can be of assistance.   Germain Osgood 12/16/2018, 1:45 PM  Germain Osgood, M.A. [place] Acute Environmental education officer [phone] Office [phone]

## 2018-12-16 NOTE — Progress Notes (Signed)
[facility]                                                                                                                                                                         Patient Demographics  Rebecca Ford, is a 85 y.o. female  DXA:[id number]  MCN:[id number]  DOB - 21-Aug-1930  Admit date - 12/11/2018  Admitting Physician [name], MD  Outpatient Primary MD for the patient is [name], MD  LOS - 4   Chief Complaint  Patient presents with  . Shortness of Breath        Assessment & Plan    Patient seen briefly today due for discharge soon per Discharge done on 12/14/18 by Dr [name], no further issues, Vital signs stable, patient feels fine.      Medications  Scheduled Meds: . amoxicillin-clavulanate  1 tablet Oral Q12H  . apixaban  10 mg Oral BID   Followed by  . [START ON 12/20/2018] apixaban  5 mg Oral BID  . diltiazem  30 mg Oral BID  . ferrous gluconate  324 mg Oral Q breakfast  . furosemide  20 mg Oral BH-q7a  . guaiFENesin  600 mg Oral BID  . iopamidol  100 mL Intravenous Once  . loratadine  10 mg Oral Daily  . LORazepam  0.5 mg Oral TID  . metoprolol tartrate  50 mg Oral BID  . pantoprazole  40 mg Oral BID  . polyethylene glycol  17 g Oral Daily   Continuous Infusions: . sodium chloride 500 mL (12/13/18 1948)   PRN Meds:.sodium chloride, acetaminophen **OR** acetaminophen, nitroGLYCERIN, ondansetron **OR** ondansetron (ZOFRAN) IV, traZODone    Time Spent in minutes   10 minutes   Rebecca Ford M.D on 12/16/2018 at 9:22 AM  Between 7am to 7pm - Pager - [phone]  After 7pm go to [email or web address] - password [email or web address]  And look for the night coverage person covering for me after hours  [facility] Office  [phone]    Subjective:   Rebecca Ford today has, No headache, No chest pain, No abdominal pain - No Nausea, No new weakness tingling  or numbness, No Cough - SOB.    Objective:   Vitals:   12/15/18 0431 12/15/18 1731 12/15/18 1959 12/16/18 0342  BP: (!) 130/40 (!) 142/66 (!) 143/68 (!) 153/73  Pulse: 92 92 97 86  Resp: 20 17 18 20   Temp: 98.5 F (36.9 C) 98.2 F (36.8 C) 98.9 F (37.2 C) 98.2 F (36.8 C)  TempSrc: Oral Oral Oral Oral  SpO2: 95% 99% 98% 97%  Weight:    61.5 kg  Height:        Wt Readings from Last 3 Encounters:  12/16/18  61.5 kg  10/31/18 60.4 kg  10/09/18 61.4 kg     Intake/Output Summary (Last 24 hours) at 12/16/2018 0349 Last data filed at 12/16/2018 0200 Gross per 24 hour  Intake 960 ml  Output 400 ml  Net 560 ml    Exam Awake Alert, Oriented X 3, No new F.N deficits, Normal affect [place].AT,PERRAL Supple Neck,No JVD, No cervical lymphadenopathy appriciated.  Symmetrical Chest wall movement, Good air movement bilaterally, CTAB RRR,No Gallops,Rubs or new Murmurs, No Parasternal Heave +ve B.Sounds, Abd Soft, Non tender, No organomegaly appriciated, No rebound - guarding or rigidity. No Cyanosis, Clubbing or edema, No new Rash or bruise      Data Review

## 2018-12-16 NOTE — Care Management Important Message (Signed)
Important Message  Patient Details  Name: KIANI WURTZEL MRN: [id number] Date of Birth: 1930/09/19   Medicare Important Message Given:  Yes    Gurtej Noyola 12/16/2018, 4:05 PM

## 2018-12-17 ENCOUNTER — Other Ambulatory Visit: Payer: Self-pay

## 2018-12-17 NOTE — Consult Note (Signed)
On 12/16/2018 Patient went to [facility] with [facility]osp[facility] and [facility] program. Patient's needs with be met in this external program.  Natividad Brood, RN BSN [place] [facility] Liaison  [phone] business mobile phone Toll free office [phone]

## 2019-01-11 DEATH — deceased

## 2020-05-26 IMAGING — RF DG ESOPHAGUS
13 series · 14 of 14 positions shown · non-contrast
Comparison: None.

CLINICAL DATA: Difficulty swallowing.

EXAM:
ESOPHOGRAM/BARIUM SWALLOW
TECHNIQUE: Single contrast examination was performed using thin barium. A
barium tablet was also administered
FLUOROSCOPY TIME:  Fluoroscopy Time:  2.9 minutes
Radiation Exposure Index (if provided by the fluoroscopic device):
55.9 mGy
Number of Acquired Spot Images: 3

[Series 1: cp_standard · 0.17mm/px · 1 of 1 slices shown (1 of 10)]
[im 1/1]
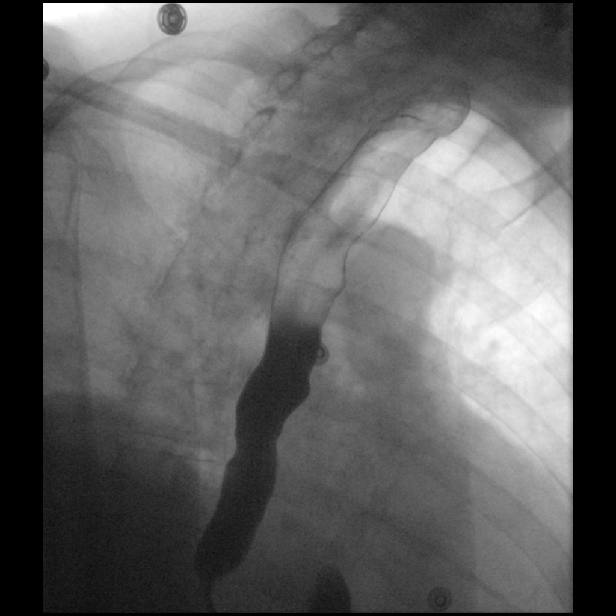

[Series 2: cp_standard · 0.17mm/px · 1 of 1 slices shown (2 of 10)]
[im 1/1]
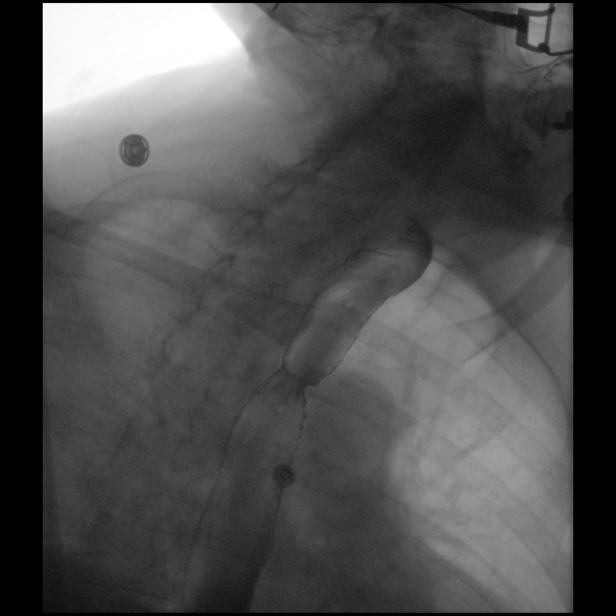

[Series 3: cp_standard · 0.17mm/px · 1 of 1 slices shown (3 of 10)]
[im 1/1]
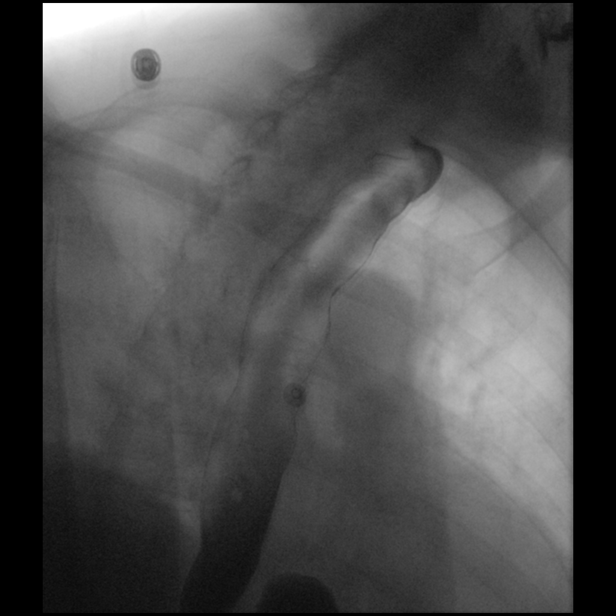

[Series 4: cp_standard · 0.17mm/px · 1 of 1 slices shown (4 of 10)]
[im 1/1]
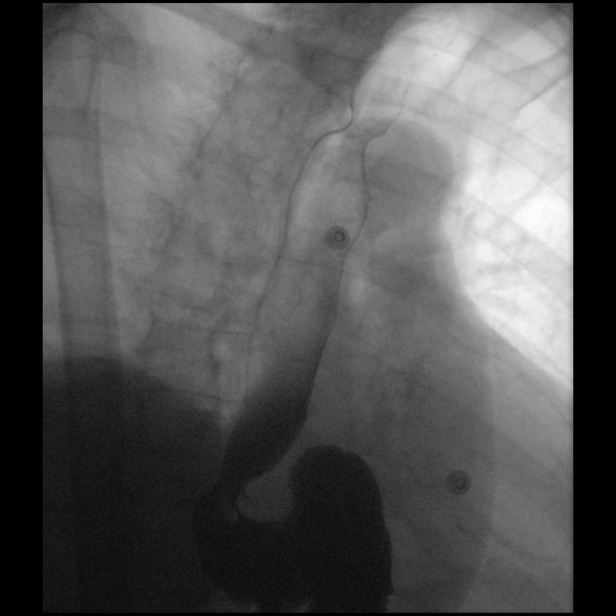

[Series 5: cp_standard · 0.17mm/px · 1 of 1 slices shown (5 of 10)]
[im 1/1]
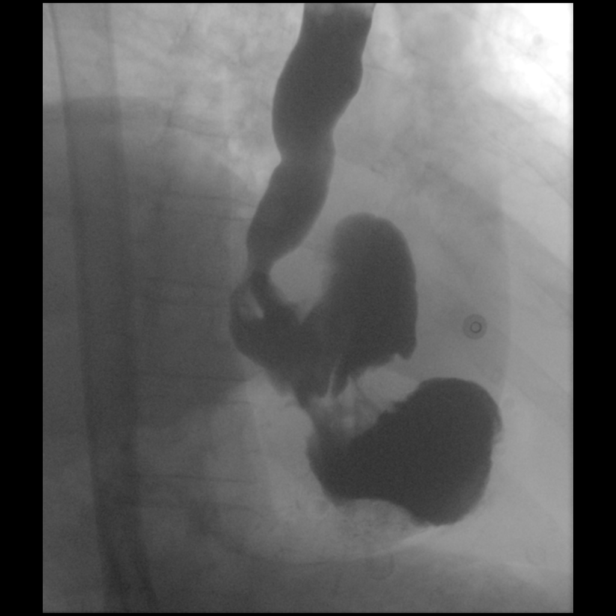

[Series 6: cp_standard · 0.17mm/px · 1 of 1 slices shown (6 of 10)]
[im 1/1]
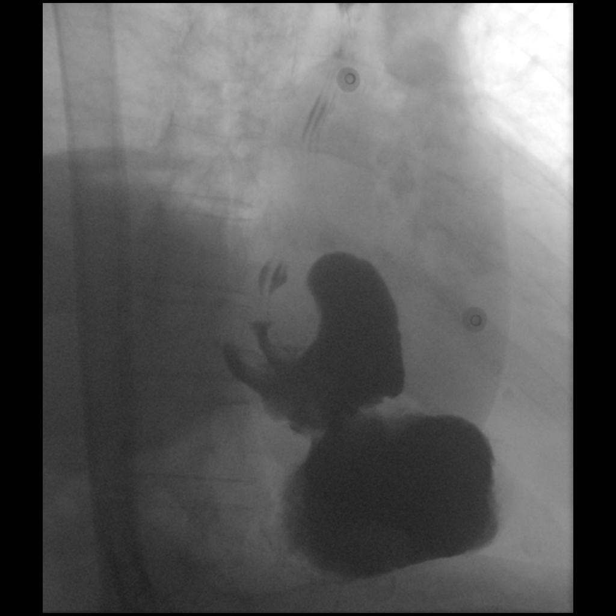

[Series 7: cp_standard · 0.17mm/px · 1 of 1 slices shown (7 of 10)]
[im 1/1]
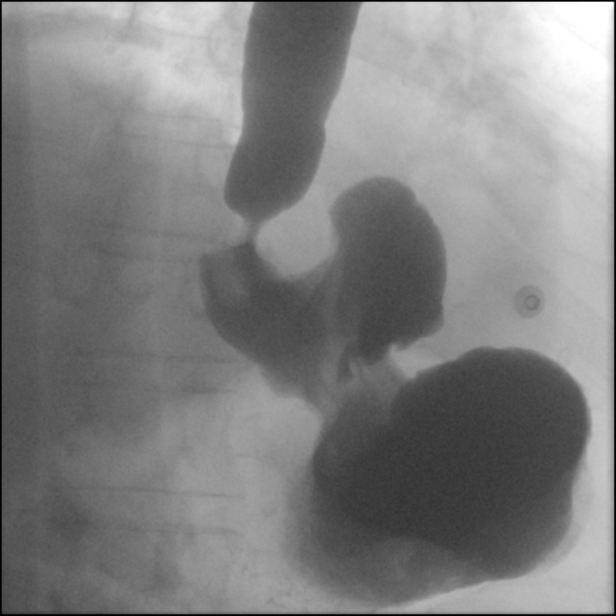

[Series 8: fluoro_barium 2fps_bw · 0.18mm/px · 1 of 1 slices shown (1 of 3)]
[im 1/1]
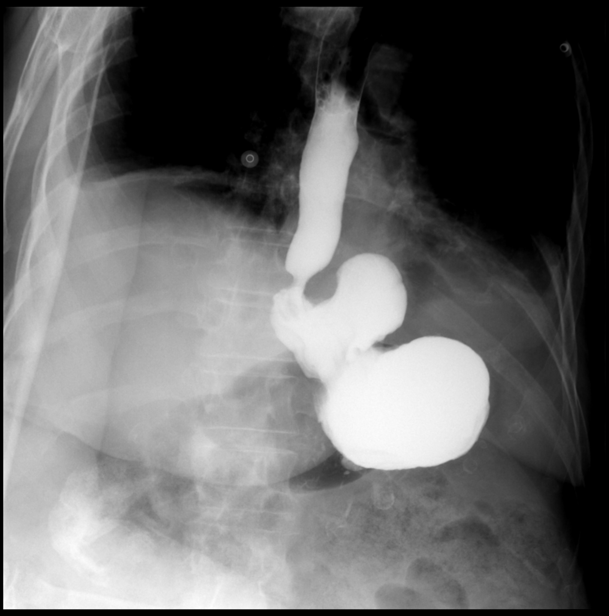

[Series 9: cp_standard · 0.26mm/px · 1 of 1 slices shown (8 of 10)]
[im 1/1]
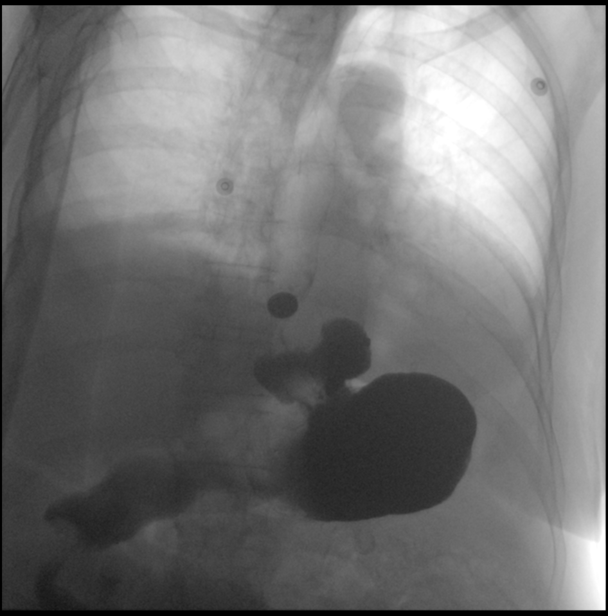

[Series 10: fluoro_barium 2fps_bw · 0.18mm/px · 1 of 1 slices shown (2 of 3)]
[im 1/1]
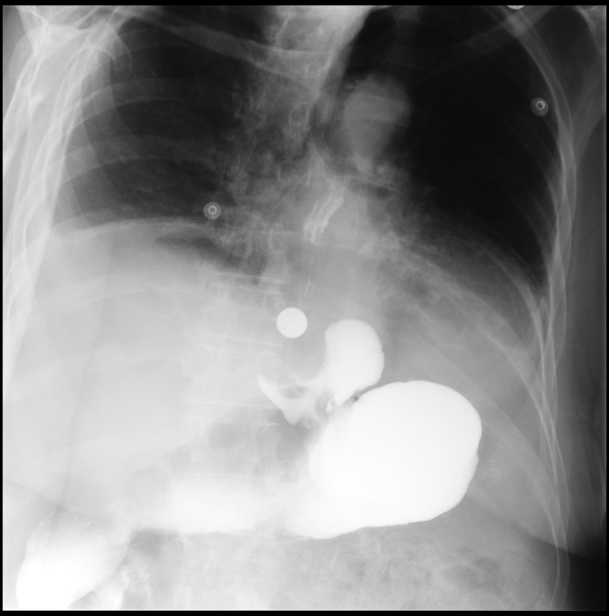

[Series 11: cp_standard · 0.26mm/px · 1 of 1 slices shown (9 of 10)]
[im 1/1]
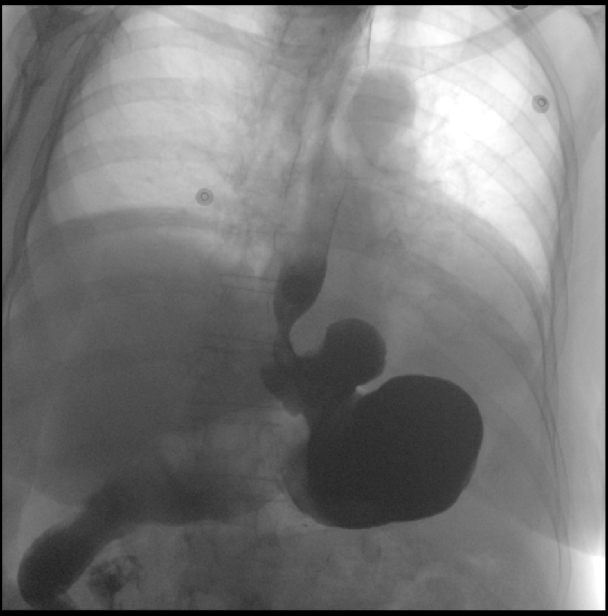

[Series 12: cp_standard · 0.18mm/px · 1 of 1 slices shown (10 of 10)]
[im 1/1]
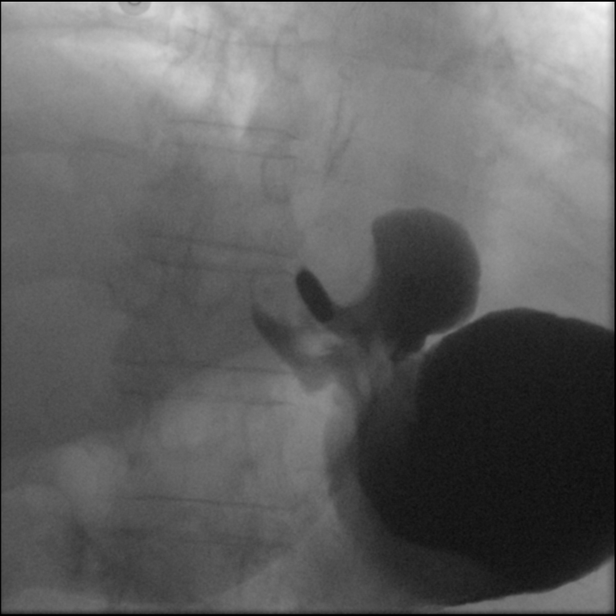

[Series 13: fluoro_barium 2fps_bw · 0.18mm/px · 2 of 2 frames shown (3 of 3)]
[frame 1/2]
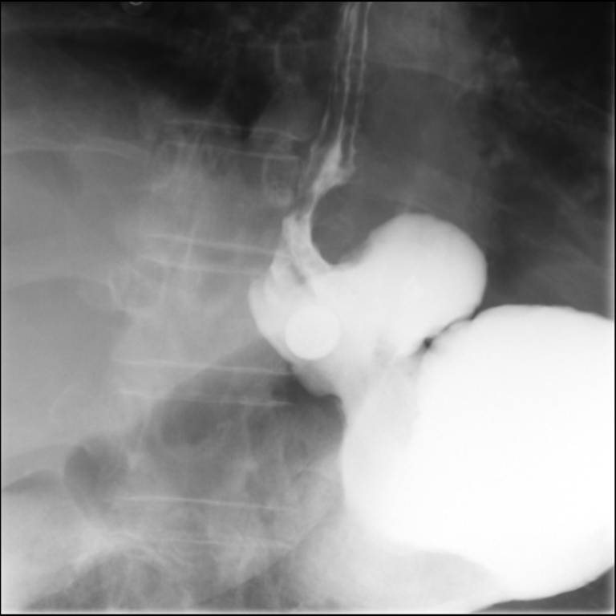
[frame 2/2]
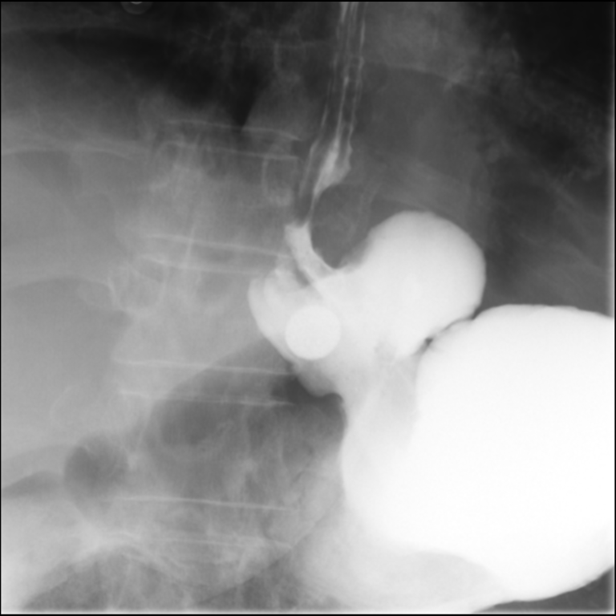

[14 of 14 positions shown; findings below may reference images not displayed]

FINDINGS: The proximal and mid esophagus are widely patent. No stricture or
mass. The motility of the esophagus is abnormal with multiple
disorganized tertiary waves. A large hiatal hernia is identified.
This contains approximately 20% intrathoracic stomach. Transient
narrowing of the distal esophagus at the level of the GE junction
noted which resulted and attenuation of tablet transit into the
intrathoracic portions of the stomach. After several swallows of
thin barium and water the tablet eventually passed into the stomach.
Mild reflux identified..
IMPRESSION: 1. Large hiatal hernia.
2. Esophageal dysmotility.
